# Patient Record
Sex: Female | Born: 1990
Health system: Southern US, Community
[De-identification: ages and names within clinical notes are randomized; demographics above are authoritative.]

## PROBLEM LIST (undated history)

## (undated) DIAGNOSIS — M6289 Other specified disorders of muscle: Secondary | ICD-10-CM

## (undated) DIAGNOSIS — K589 Irritable bowel syndrome without diarrhea: Secondary | ICD-10-CM

## (undated) DIAGNOSIS — Z9889 Other specified postprocedural states: Secondary | ICD-10-CM

## (undated) DIAGNOSIS — N390 Urinary tract infection, site not specified: Secondary | ICD-10-CM

## (undated) DIAGNOSIS — R112 Nausea with vomiting, unspecified: Secondary | ICD-10-CM

## (undated) DIAGNOSIS — H7290 Unspecified perforation of tympanic membrane, unspecified ear: Secondary | ICD-10-CM

## (undated) DIAGNOSIS — K602 Anal fissure, unspecified: Secondary | ICD-10-CM

## (undated) DIAGNOSIS — O139 Gestational [pregnancy-induced] hypertension without significant proteinuria, unspecified trimester: Secondary | ICD-10-CM

## (undated) DIAGNOSIS — R87629 Unspecified abnormal cytological findings in specimens from vagina: Secondary | ICD-10-CM

## (undated) DIAGNOSIS — G43909 Migraine, unspecified, not intractable, without status migrainosus: Secondary | ICD-10-CM

## (undated) DIAGNOSIS — E063 Autoimmune thyroiditis: Secondary | ICD-10-CM

## (undated) HISTORY — DX: Gestational (pregnancy-induced) hypertension without significant proteinuria, unspecified trimester: O13.9

## (undated) HISTORY — DX: Other specified disorders of muscle: M62.89

## (undated) HISTORY — PX: EAR TUBE REMOVAL: SHX1486

## (undated) HISTORY — PX: TYMPANOPLASTY: SHX33

## (undated) HISTORY — DX: Irritable bowel syndrome, unspecified: K58.9

## (undated) HISTORY — DX: Anal fissure, unspecified: K60.2

## (undated) HISTORY — PX: WISDOM TOOTH EXTRACTION: SHX21

## (undated) HISTORY — DX: Migraine, unspecified, not intractable, without status migrainosus: G43.909

## (undated) HISTORY — DX: Unspecified perforation of tympanic membrane, unspecified ear: H72.90

## (undated) HISTORY — PX: TYMPANOSTOMY TUBE PLACEMENT: SHX32

## (undated) HISTORY — PX: TONSILLECTOMY AND ADENOIDECTOMY: SUR1326

---

## 2014-05-16 ENCOUNTER — Other Ambulatory Visit: Payer: Self-pay | Admitting: Obstetrics and Gynecology

## 2014-05-16 DIAGNOSIS — N23 Unspecified renal colic: Secondary | ICD-10-CM

## 2014-05-16 DIAGNOSIS — K8020 Calculus of gallbladder without cholecystitis without obstruction: Secondary | ICD-10-CM

## 2014-05-19 ENCOUNTER — Ambulatory Visit (HOSPITAL_COMMUNITY)
Admission: RE | Admit: 2014-05-19 | Discharge: 2014-05-19 | Disposition: A | Discharge status: Home / Self Care | Payer: BLUE CROSS/BLUE SHIELD | Source: Ambulatory Visit | Attending: Obstetrics and Gynecology | Admitting: Obstetrics and Gynecology

## 2014-05-19 DIAGNOSIS — R1010 Upper abdominal pain, unspecified: Secondary | ICD-10-CM | POA: Diagnosis present

## 2014-05-19 DIAGNOSIS — N23 Unspecified renal colic: Secondary | ICD-10-CM

## 2014-05-19 DIAGNOSIS — K8020 Calculus of gallbladder without cholecystitis without obstruction: Secondary | ICD-10-CM

## 2014-07-11 ENCOUNTER — Ambulatory Visit (INDEPENDENT_AMBULATORY_CARE_PROVIDER_SITE_OTHER): Payer: 59 | Admitting: Neurology

## 2014-07-11 ENCOUNTER — Encounter: Payer: Self-pay | Admitting: Neurology

## 2014-07-11 VITALS — BP 114/80 | HR 84 | Resp 12 | Ht 65.5 in | Wt 119.0 lb

## 2014-07-11 DIAGNOSIS — Z0289 Encounter for other administrative examinations: Secondary | ICD-10-CM

## 2014-07-11 DIAGNOSIS — M791 Myalgia, unspecified site: Secondary | ICD-10-CM | POA: Insufficient documentation

## 2014-07-11 DIAGNOSIS — R350 Frequency of micturition: Secondary | ICD-10-CM | POA: Diagnosis not present

## 2014-07-11 DIAGNOSIS — M542 Cervicalgia: Secondary | ICD-10-CM

## 2014-07-11 DIAGNOSIS — R208 Other disturbances of skin sensation: Secondary | ICD-10-CM | POA: Diagnosis not present

## 2014-07-11 MED ORDER — ETODOLAC ER 600 MG PO TB24: 600.0000 mg | ORAL_TABLET | Freq: Every day | ORAL | Status: DC

## 2014-07-11 NOTE — Progress Notes (Signed)
[facility]  PATIENT: Annette Parks DOB: 12/06/1990  REFERRING DOCTOR OR PCP:  [facility], NP) SOURCE: Patient22  _________________________________   HISTORICAL  CHIEF COMPLAINT:  Chief Complaint  Patient presents with  . Neck Pain    Sts. has had neck, lbp, with left arm, left sided torso numbness/tingling onset Sept. 2015 with no known injury.  Lower back pain radiates slightly into left buttock/fim  . Back Pain  . Numbness    HISTORY OF PRESENT ILLNESS:  She is a 24 yo woman who has had neck pain since September 2015.  Pain started in the shoulder, near her neck and then worked it's way down the back.   She just woke up in more pain the one day, without any obvious trigger.   She has had numbness in the left arm since October 2015 below the elbow.   She gets a burning numbness in the upper back all the way to the buttocks (right more than left).    If she puts anything around her neck, she gets numbness increased.   She saw PT and did therapy 3/week x several weeks and was started on Flexeril.   Flexeril caused her to be sleepy so she stopped.  Therapy helped a little bit.  She tries to do some of the exercises at work and also tries to keep her posture straight.    Heat has helped the back.  Solonpas pads worsened the dysesthesia.        She notes decreased sensation near the neck on the left.     She denies any weakness in the arm.    She has urinary frequency and urgency and had a UA with a small amount of blood.   Cultures were negative but she had a course of Cipro.   She now has 2 x nocturia.   Urgency is mildly better since starting Cipro.  She had an xray showing 'reverse lordosis' .  Heat helps the pain some.  NSAIDs help for a while.    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     REVIEW OF SYSTEMS: Constitutional: No fevers, chills, sweats, or change in appetite Eyes: No visual changes, double vision, eye  pain Ear, nose and throat: No hearing loss, ear pain, nasal congestion, sore throat Cardiovascular: No chest pain, palpitations Respiratory: No shortness of breath at rest or with exertion.   No wheezes GastrointestinaI: No nausea, vomiting, diarrhea, abdominal pain, fecal incontinence Genitourinary: Some frequency.  Occ nocturia. Musculoskeletal: No neck pain, back pain Integumentary: No rash, pruritus, skin lesions Neurological: as above Psychiatric: No depression at this time.  No anxiety Endocrine: No palpitations, diaphoresis, change in appetite, change in weigh or increased thirst Hematologic/Lymphatic: No anemia, purpura, petechiae. Allergic/Immunologic: Some seasonal allergies.  No rashes  ALLERGIES: No Known Allergies  HOME MEDICATIONS:  Current outpatient prescriptions:  .  cetirizine (ZYRTEC) 10 MG tablet, Take 10 mg by mouth daily., Disp: , Rfl:  .  levonorgestrel-ethinyl estradiol (AVIANE,ALESSE,LESSINA) 0.1-20 MG-MCG tablet, Take 1 tablet by mouth daily., Disp: , Rfl:   PAST MEDICAL HISTORY: Past Medical History  Diagnosis Date  . IBS (irritable bowel syndrome)     PAST SURGICAL HISTORY: Past Surgical History  Procedure Laterality Date  . Tympanostomy tube placement    . Tonsillectomy and adenoidectomy    . Wisdom tooth extraction      FAMILY HISTORY: Family History  Problem Relation Age of Onset  .  Hyperlipidemia Father     SOCIAL HISTORY:  History   Social History  . Marital Status: Single    Spouse Name: N/A  . Number of Children: N/A  . Years of Education: N/A   Occupational History  . Registered nurse    Social History Main Topics  . Smoking status: Never Smoker   . Smokeless tobacco: Not on file  . Alcohol Use: 0.0 oz/week    0 Standard drinks or equivalent per week     Comment: occasional  . Drug Use: No  . Sexual Activity: Not on file   Other Topics Concern  . Not on file   Social History Narrative  . No narrative on file       PHYSICAL EXAM  Filed Vitals:   07/11/14 1556  BP: 114/80  Pulse: 84  Resp: 12  Height: 5' 5.5" (1.664 m)  Weight: 119 lb (53.978 kg)    Body mass index is 19.49 kg/(m^2).   General: The patient is well-developed and well-nourished and in no acute distress  Neck: The neck is supple, no carotid bruits are noted.  The neck is tender at the left trapezius, lower cervical paraspinals and rhomboid muscles  Cardiovascular: The heart has a regular rate and rhythm with a normal S1 and S2. There were no murmurs, gallops or rubs. Lungs are clear to auscultation.  Skin: Extremities are without significant edema.  Musculoskeletal:  Back is mildly tender over lower lumbar paraspinals and piriformis muscles  Neurologic Exam  Mental status: The patient is alert and oriented x 3 at the time of the examination. The patient has apparent normal recent and remote memory, with an apparently normal attention span and concentration ability.   Speech is normal.  Cranial nerves: Extraocular movements are full..  Facial symmetry is present. There is good facial sensation to soft touch bilaterally.Facial strength is normal.  Trapezius and sternocleidomastoid strength is normal. No dysarthria is noted.  The tongue is midline, and the patient has symmetric elevation of the soft palate. No obvious hearing deficits are noted.  Motor:  Muscle bulk is normal.   Tone is normal. Strength is  5 / 5 in all 4 extremities.   Sensory: Sensory testing is intact to soft touch and vibration sensation in all 4 extremities.  Coordination: Cerebellar testing reveals good finger-nose-finger and heel-to-shin bilaterally.  Gait and station: Station is normal.   Gait is normal. Tandem gait is normal.   Reflexes: Deep tendon reflexes are symmetric and normal bilaterally.      DIAGNOSTIC DATA (LABS, IMAGING, TESTING) - I reviewed patient records, labs, notes, testing and imaging myself where  available.    ASSESSMENT AND PLAN  Neck pain - Plan: MR Cervical Spine Wo Contrast, Sedimentation Rate, ANA  Myalgia - Plan: Sedimentation Rate, ANA  Dysesthesia - Plan: MR Cervical Spine Wo Contrast, Sedimentation Rate, ANA  Urinary frequency    In summary, Annette Parks is a 24 year old woman with a six-month history of neck pain, left greater than right sided spasms, numbness/dysesthesias and bladder changes. The etiology is not completely clear but with the numbness we need to obtain an MRI of the cervical spine to make sure that there is not a disc or spinal cord process that would require treatment. I will have her take an NSAID and we discussed obtaining an over-the-door traction device used for 15 minutes with 10 pounds in the afternoon or evening.  I'll also check some vasculitis labs to make sure that she does  not have an autoimmune etiology. If she is no better off in a week we can do the trigger point injections into the trapezius, rhomboid another tender muscles.  She will return to see me as needed and I will let her know the results of the MRI and tests  Richard A. Felecia Shelling, MD, PhD 10/27/4101, 0:13 PM Certified in Neurology, Clinical Neurophysiology, Sleep Medicine, Pain Medicine and Neuroimaging  [facility] [address] [place], [place] [address] [phone]

## 2014-07-12 LAB — ANA: ANA: POSITIVE — AB

## 2014-07-12 LAB — SEDIMENTATION RATE: SED RATE: 2 mm/h (ref 0–32)

## 2014-07-14 ENCOUNTER — Ambulatory Visit (INDEPENDENT_AMBULATORY_CARE_PROVIDER_SITE_OTHER): Payer: 59

## 2014-07-14 DIAGNOSIS — M542 Cervicalgia: Secondary | ICD-10-CM

## 2014-07-14 DIAGNOSIS — R208 Other disturbances of skin sensation: Secondary | ICD-10-CM | POA: Diagnosis not present

## 2014-07-14 LAB — SPECIMEN STATUS REPORT

## 2014-07-18 LAB — SPECIMEN STATUS REPORT

## 2014-07-18 LAB — ENA+DNA/DS+SJORGEN'S
ENA RNP AB: 1.4 AI — AB (ref 0.0–0.9)
ENA SM Ab Ser-aCnc: <0.2 AI (ref 0.0–0.9)
ENA SSA (RO) Ab: <0.2 AI (ref 0.0–0.9)
ENA SSB (LA) Ab: <0.2 AI (ref 0.0–0.9)

## 2014-07-18 LAB — ANA W/REFLEX: ANA: POSITIVE — AB

## 2015-02-22 ENCOUNTER — Other Ambulatory Visit: Payer: Self-pay | Admitting: *Deleted

## 2015-02-22 DIAGNOSIS — M79642 Pain in left hand: Secondary | ICD-10-CM

## 2015-02-22 DIAGNOSIS — M791 Myalgia, unspecified site: Secondary | ICD-10-CM

## 2015-02-22 DIAGNOSIS — M25541 Pain in joints of right hand: Secondary | ICD-10-CM

## 2015-02-23 ENCOUNTER — Other Ambulatory Visit (INDEPENDENT_AMBULATORY_CARE_PROVIDER_SITE_OTHER): Payer: Self-pay

## 2015-02-23 DIAGNOSIS — M79642 Pain in left hand: Secondary | ICD-10-CM

## 2015-02-23 DIAGNOSIS — M25541 Pain in joints of right hand: Secondary | ICD-10-CM

## 2015-02-23 DIAGNOSIS — Z0289 Encounter for other administrative examinations: Secondary | ICD-10-CM

## 2015-02-23 DIAGNOSIS — M791 Myalgia, unspecified site: Secondary | ICD-10-CM

## 2015-02-24 LAB — RHEUMATOID FACTOR: Rhuematoid fact SerPl-aCnc: <10 IU/mL (ref 0.0–13.9)

## 2015-02-24 LAB — ANA: Anti Nuclear Antibody(ANA): POSITIVE — AB

## 2015-02-24 LAB — SEDIMENTATION RATE: Sed Rate: 2 mm/hr (ref 0–32)

## 2015-02-27 ENCOUNTER — Telehealth: Payer: Self-pay | Admitting: *Deleted

## 2015-02-27 NOTE — Telephone Encounter (Signed)
I have spoken with Shamikia and per RAS, advised ANA is still positive.  I have spoken with Malachy Mood in the pharmacy and she will add reflex/fim

## 2015-02-27 NOTE — Telephone Encounter (Signed)
-----   Message from Britt Bottom, MD sent at 02/27/2015 12:57 PM EDT ----- I let him a note that the ANA was still positive and the other tests were fine. Could you see if we could add the reflex to the ANA

## 2015-02-28 LAB — SPECIMEN STATUS REPORT

## 2015-03-01 LAB — ENA+DNA/DS+SJORGEN'S
ENA RNP Ab: 1.9 AI — ABNORMAL HIGH (ref 0.0–0.9)
ENA SSA (RO) Ab: <0.2 AI (ref 0.0–0.9)
ENA SSB (LA) Ab: <0.2 AI (ref 0.0–0.9)

## 2015-03-01 LAB — SPECIMEN STATUS REPORT

## 2015-03-01 LAB — ANA W/REFLEX: Anti Nuclear Antibody(ANA): POSITIVE — AB

## 2015-03-22 ENCOUNTER — Ambulatory Visit (INDEPENDENT_AMBULATORY_CARE_PROVIDER_SITE_OTHER): Payer: 59 | Admitting: Family Medicine

## 2015-03-22 ENCOUNTER — Encounter: Payer: Self-pay | Admitting: Family Medicine

## 2015-03-22 VITALS — BP 120/77 | HR 93 | Temp 98.1°F | Resp 16 | Ht 65.0 in | Wt 126.0 lb

## 2015-03-22 DIAGNOSIS — F411 Generalized anxiety disorder: Secondary | ICD-10-CM

## 2015-03-22 DIAGNOSIS — R61 Generalized hyperhidrosis: Secondary | ICD-10-CM

## 2015-03-22 DIAGNOSIS — R002 Palpitations: Secondary | ICD-10-CM | POA: Diagnosis not present

## 2015-03-22 LAB — TSH: TSH: 1.529 u[IU]/mL (ref 0.350–4.500)

## 2015-03-22 MED ORDER — FLUOXETINE HCL 20 MG PO TABS: 20.0000 mg | ORAL_TABLET | Freq: Every day | ORAL | Status: DC

## 2015-03-22 NOTE — Progress Notes (Signed)
[facility] [address] [place] [address] [phone]  Date:  03/22/2015   Name:  Annette Parks   DOB:  Jul 13, 1990   MRN:  [id number]  PCP:  No primary care provider on file.    Chief Complaint: Annual Exam   History of Present Illness:  Annette Parks is a 24 y.o. very pleasant female patient who presents with the following:  Here today as a new pt to establish care. 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   She has indicated multiple concerns on her ROS sheet which we discussed as below She has had a lot of ENT issues over the years- she has an ENT doctor She does have an OBG and her pap is UTD, she uses OCP. Creta Levin is her OBG provider  She does also suffer from IBS- she uses a probiotic that helps her some.  Dr. Collene Mares saw her a few times.   She is also using activia yogurt.  She has some bleeding due to an anal fissure.    She does have some anxiety- she has noted this for years.  Her parents tried to work with her about her anxiety as a teen but she "did not want to listen to them."  However she now realized that they were right.   She does have some OCD tendencies.  She does not have any compulsive behaviors.  She can get overwhelmed easily.   Excessive sweating can be an issue.   She notes anxiety maybe "90%" of the time.   She did have some sx of depression last year- her family was all living in [place], and she was on the night shift.  She moved to [facility] and these sx have improved.  No SI  She did have a heart murmur as a teen- this was evaluated by cardiology. She does notice some heart palpitations at times- these seem more related to being more active.  She has checked her pulse and thinks that she is having PVCs on occasion.    LMP 11/8 Patient Active Problem List   Diagnosis Date Noted  . Neck pain 07/11/2014  . Myalgia 07/11/2014  . Dysesthesia 07/11/2014  . Urinary frequency 07/11/2014    Past Medical History   Diagnosis Date  . IBS (irritable bowel syndrome)     Past Surgical History  Procedure Laterality Date  . Tympanostomy tube placement    . Tonsillectomy and adenoidectomy    . Wisdom tooth extraction      Social History  Substance Use Topics  . Smoking status: Never Smoker   . Smokeless tobacco: None  . Alcohol Use: 0.0 oz/week    0 Standard drinks or equivalent per week     Comment: occasional    Family History  Problem Relation Age of Onset  . Hyperlipidemia Father     Not on File  Medication list has been reviewed and updated.  Current Outpatient Prescriptions on File Prior to Visit  Medication Sig Dispense Refill  . cetirizine (ZYRTEC) 10 MG tablet Take 10 mg by mouth daily.    Marland Kitchen etodolac (LODINE XL) 600 MG 24 hr tablet Take 1 tablet (600 mg total) by mouth daily. 30 tablet 3  . levonorgestrel-ethinyl estradiol (AVIANE,ALESSE,LESSINA) 0.1-20 MG-MCG tablet Take 1 tablet by mouth daily.     No current facility-administered medications on file prior to visit.    Review of Systems:  As per HPI-  otherwise negative.   Physical Examination: Filed Vitals:   03/22/15 1020  BP: 120/77  Pulse: 93  Temp: 98.1 F (36.7 C)  Resp: 16   Filed Vitals:   03/22/15 1020  Height: 5\' 5"  (1.651 m)  Weight: 126 lb (57.153 kg)   Body mass index is 20.97 kg/(m^2). Ideal Body Weight: Weight in (lb) to have BMI = 25: 149.9  GEN: WDWN, NAD, Non-toxic, A & O x 3, slim, looks very healthy  HEENT: Atraumatic, Normocephalic. Neck supple. No masses, No LAD.  Bilateral TM wnl, oropharynx normal.  PEERL,EOMI.   Ears and Nose: No external deformity. CV: RRR, No M/G/R. No JVD. No thrill. No extra heart sounds. PULM: CTA B, no wheezes, crackles, rhonchi. No retractions. No resp. distress. No accessory muscle use. ABD: S, NT, ND, +BS. No rebound. No HSM. EXTR: No c/c/e NEURO Normal gait.  PSYCH: Normally interactive. Conversant. Not depressed or anxious appearing.  Calm demeanor.    EKG:  SR, no concerning findings Assessment and Plan: GAD (generalized anxiety disorder) - Plan: FLUoxetine (PROZAC) 20 MG tablet  Palpitations - Plan: EKG 12-Lead, Thyroid peroxidase antibody  Excessive sweating - Plan: TSH, Thyroid peroxidase antibody  Here today with a few concerns. Will start prozac for anxiety. She is concerned about weight gain as she has gone up a few lbs recently.  However reassured the prozac is generally weight neutral, and in any case her weight is still on the low side of normal.  She will let me know how this does for her She would like to be screened for hashimotos thyroiditis as this was an issue for her mother.  Will check labs as above for her No evidence of a dangerous cause of her palpitations.  Offered to refer to cardiology but she declines for now Will plan further follow- up pending labs.   Signed Lamar Blinks, MD

## 2015-03-22 NOTE — Patient Instructions (Addendum)
It was nice to see you today!  I will be in touch with your labs- we will look for any thyroid issue.    Let's have you start on 20mg  of prozac once a day.  After 2-3 weeks you can increase to 40 mg as long as you are not too bothered by any side effects.  Please send me a mychart in about one month with an update.    Your EKG looks fine.  Let me know if you have any change or worsening of your palpitations

## 2015-03-24 LAB — THYROID PEROXIDASE ANTIBODY: Thyroperoxidase Ab SerPl-aCnc: 2 IU/mL (ref <9)

## 2015-04-09 ENCOUNTER — Telehealth: Payer: Self-pay | Admitting: Family Medicine

## 2015-04-09 ENCOUNTER — Encounter: Payer: Self-pay | Admitting: Family Medicine

## 2015-04-09 NOTE — Telephone Encounter (Signed)
Pt called the answering service at 8:30 pm with complaint of a "headache like someone pressing behind my eyes" for about 10 days.  She is not sure of the cause.  She has been using OTC medications.  Advised her that we are glad to see her tomorrow- she is concerned about her work scheduled and I advised her of our extended hours.  Also advised her that if she does not feel that she is ok to be seen tomorrow please go to the ER tonight.  She stated understanding.  Suggested that she not take any NSAIDs prior to being seen tomorrow so we might give her a shot of toradol

## 2015-04-10 ENCOUNTER — Ambulatory Visit (INDEPENDENT_AMBULATORY_CARE_PROVIDER_SITE_OTHER): Payer: 59 | Admitting: Family Medicine

## 2015-04-10 VITALS — BP 110/64 | HR 99 | Temp 98.1°F | Resp 18 | Ht 66.0 in | Wt 122.0 lb

## 2015-04-10 DIAGNOSIS — G44221 Chronic tension-type headache, intractable: Secondary | ICD-10-CM

## 2015-04-10 MED ORDER — KETOROLAC TROMETHAMINE 30 MG/ML IJ SOLN
30.0000 mg | Freq: Once | INTRAMUSCULAR | Status: AC
  Administered 2015-04-10: 30 mg via INTRAMUSCULAR

## 2015-04-10 MED ORDER — PREDNISONE 20 MG PO TABS: ORAL_TABLET | ORAL | Status: DC

## 2015-04-10 NOTE — Progress Notes (Addendum)
[facility] [address] [place] [address] [phone]  Date:  04/10/2015   Name:  Annette Parks   DOB:  1991-01-31   MRN:  [id number]  PCP:  No primary care provider on file.    Chief Complaint: Eye Pain   History of Present Illness:  Annette Parks is a 24 y.o. very pleasant female patient who presents with the following:  See phone note from yesterday- I saw this pt in clinic about 2 weeks ago and we started prozac for anxiety.  She then noted onset of a persistent HA.  She feels like her HA is "worse today."  She has not had a HA quite like this in the past.   " I was pretty much born with chronic migraines but I grew out of that."   She notes a pain and pressure behind her eyes and in her nose but does not have any nasal congestion No fever.  She is not sure what she used for her migraines in the past- no recent migraine.    No phono or photo sensitivity.  Cold air can increase her sx No vomiting- she has noted some burping and tried pepto for this. It did help.   She is eating normally She does have more pain if she bends down or lies down.    Over the last 24 hours she has taken only her OCP.    Recent thyroid labs looked fine  Results for orders placed or performed in visit on 03/22/15  TSH  Result Value Ref Range   TSH 1.529 0.350 - 4.500 uIU/mL  Thyroid peroxidase antibody  Result Value Ref Range   Thyroperoxidase Ab SerPl-aCnc 2 <9 IU/mL     Patient Active Problem List   Diagnosis Date Noted  . GAD (generalized anxiety disorder) 03/22/2015  . Neck pain 07/11/2014  . Myalgia 07/11/2014  . Dysesthesia 07/11/2014  . Urinary frequency 07/11/2014    Past Medical History  Diagnosis Date  . IBS (irritable bowel syndrome)     Past Surgical History  Procedure Laterality Date  . Tympanostomy tube placement    . Tonsillectomy and adenoidectomy    . Wisdom tooth extraction      Social History  Substance Use Topics  . Smoking status:  Never Smoker   . Smokeless tobacco: None  . Alcohol Use: 0.0 oz/week    0 Standard drinks or equivalent per week     Comment: occasional    Family History  Problem Relation Age of Onset  . Hyperlipidemia Father     No Known Allergies  Medication list has been reviewed and updated.  Current Outpatient Prescriptions on File Prior to Visit  Medication Sig Dispense Refill  . cetirizine (ZYRTEC) 10 MG tablet Take 10 mg by mouth daily.    Marland Kitchen FLUoxetine (PROZAC) 20 MG tablet Take 1 tablet (20 mg total) by mouth daily. May increase to 40 mg after 2-3 weeks 60 tablet 4  . levonorgestrel-ethinyl estradiol (AVIANE,ALESSE,LESSINA) 0.1-20 MG-MCG tablet Take 1 tablet by mouth daily.    Marland Kitchen etodolac (LODINE XL) 600 MG 24 hr tablet Take 1 tablet (600 mg total) by mouth daily. (Patient not taking: Reported on 04/10/2015) 30 tablet 3   No current facility-administered medications on file prior to visit.    Review of Systems:  As per HPI- otherwise negative.   Physical Examination: Filed Vitals:   04/10/15 1206  BP: 110/64  Pulse: 99  Temp: 98.1 F (36.7 C)  Resp: 18   Filed Vitals:   04/10/15 1206  Height: 5\' 6"  (1.676 m)  Weight: 122 lb (55.339 kg)   Body mass index is 19.7 kg/(m^2). Ideal Body Weight: Weight in (lb) to have BMI = 25: 154.6  GEN: WDWN, NAD, Non-toxic, A & O x 3, looks well, neck is supple HEENT: Atraumatic, Normocephalic. Neck supple. No masses, No LAD.  Bilateral TM wnl, oropharynx normal.  PEERL,EOMI.   Limited fundoscopic exam normal.  Bilateral globes are non- tender to gentle pressure with lids closed.  Nasal cavity is congested but she denies any tenderness with percussion over the sinuses  Ears and Nose: No external deformity. CV: RRR, No M/G/R. No JVD. No thrill. No extra heart sounds. PULM: CTA B, no wheezes, crackles, rhonchi. No retractions. No resp. distress. No accessory muscle use. EXTR: No c/c/e NEURO Normal gait.  PSYCH: Normally interactive.  Conversant. Not depressed or anxious appearing.  Calm demeanor.  Full neuro exam is wnl- normal strength, DTR, sensation and balance testing.  Normal facial motion and strength of all limbs.  Negative romberg, normal tandem gait testing  Assessment and Plan: Chronic tension-type headache, intractable - Plan: ketorolac (TORADOL) 30 MG/ML injection 30 mg, predniSONE (DELTASONE) 20 MG tablet  Discussed with Terrence Dupont in detail.  Discussed imaging vs treatment first.  Since her sx have been present for over a week do not feel compelled to do a CT- especially given radiation concerns. She agrees and would prefer to try treatment first.  This may be a tension HA vs sinus pressure.  Will treat her with a shot of toradol today and also gave her an rx for prednisone to fill tomorrow if needed.  If her sx are worse or if not better soon she will call me and we can consider further treatment   Signed Lamar Blinks, MD  Called to check on her 12/13[facility] asking her to send me a message with an update.

## 2015-04-10 NOTE — Patient Instructions (Signed)
We gave you a shot of toradol for your headache today.  No additional NSAIDs such as ibuprofen or aleve today If your headache is still lingering tomorrow start the prednisone rx.  (if you do take the prednisone continue to avoid NSAIDs while you are on it).     If your headache is still not better please let me know and we will have your eyes checked and consider a CT of your head

## 2015-04-12 ENCOUNTER — Encounter: Payer: Self-pay | Admitting: Family Medicine

## 2015-05-01 MED FILL — LEVONOR-ETH ESTRAD 0.1-0.02: 0.1-20 | 84 days supply | Qty: 84 | Fill #3

## 2015-07-11 DIAGNOSIS — Z01419 Encounter for gynecological examination (general) (routine) without abnormal findings: Secondary | ICD-10-CM | POA: Diagnosis not present

## 2015-07-11 DIAGNOSIS — Z6820 Body mass index (BMI) 20.0-20.9, adult: Secondary | ICD-10-CM | POA: Diagnosis not present

## 2015-07-11 MED FILL — LEVONOR-ETH ESTRAD 0.1-0.02: 0.1-20 | 84 days supply | Qty: 84 | Fill #0

## 2015-08-17 ENCOUNTER — Encounter: Payer: Self-pay | Admitting: Family Medicine

## 2015-10-17 MED FILL — LEVONOR-ETH ESTRAD 0.1-0.02: 0.1-20 | 84 days supply | Qty: 84 | Fill #1

## 2016-01-03 MED FILL — LEVONOR-ETH ESTRAD 0.1-0.02: 0.1-20 | 84 days supply | Qty: 84 | Fill #2

## 2016-01-23 ENCOUNTER — Other Ambulatory Visit: Payer: Self-pay | Admitting: Neurology

## 2016-01-23 DIAGNOSIS — M542 Cervicalgia: Secondary | ICD-10-CM

## 2016-02-02 DIAGNOSIS — M542 Cervicalgia: Secondary | ICD-10-CM | POA: Diagnosis not present

## 2016-02-02 MED FILL — predniSONE 5 MG (21) TBPK: 5 | 6 days supply | Qty: 21 | Fill #0

## 2016-02-16 DIAGNOSIS — G43909 Migraine, unspecified, not intractable, without status migrainosus: Secondary | ICD-10-CM | POA: Diagnosis not present

## 2016-02-16 DIAGNOSIS — Z1329 Encounter for screening for other suspected endocrine disorder: Secondary | ICD-10-CM | POA: Diagnosis not present

## 2016-02-16 DIAGNOSIS — H40053 Ocular hypertension, bilateral: Secondary | ICD-10-CM | POA: Diagnosis not present

## 2016-02-16 DIAGNOSIS — Z79899 Other long term (current) drug therapy: Secondary | ICD-10-CM | POA: Diagnosis not present

## 2016-02-16 DIAGNOSIS — J3489 Other specified disorders of nose and nasal sinuses: Secondary | ICD-10-CM | POA: Diagnosis not present

## 2016-02-16 LAB — HEPATIC FUNCTION PANEL
ALT: 20 U/L (ref 7–35)
AST: 17 U/L (ref 13–35)
Alkaline Phosphatase: 46 U/L (ref 25–125)
Bilirubin, Total: 0.8 mg/dL

## 2016-02-16 LAB — BASIC METABOLIC PANEL
BUN: 10 mg/dL (ref 4–21)
CREATININE: 0.9 mg/dL (ref 0.5–1.1)
Glucose: 108 mg/dL
POTASSIUM: 4.1 mmol/L (ref 3.4–5.3)
Sodium: 140 mmol/L (ref 137–147)

## 2016-02-16 LAB — CBC AND DIFFERENTIAL
HCT: 46 % (ref 36–46)
HEMOGLOBIN: 14.5 g/dL (ref 12.0–16.0)
Platelets: 276 10*3/uL (ref 150–399)
WBC: 7.3 10^3/mL

## 2016-02-16 LAB — TSH: TSH: 1.3 u[IU]/mL (ref 0.41–5.90)

## 2016-02-23 DIAGNOSIS — G43909 Migraine, unspecified, not intractable, without status migrainosus: Secondary | ICD-10-CM | POA: Diagnosis not present

## 2016-02-28 MED FILL — FLUCONAZOLE 150 MG TABLET: 150 | 1 days supply | Qty: 1 | Fill #0

## 2016-02-29 MED FILL — SUMATRIPTAN SUCC 100 MG TAB: 100 | 30 days supply | Qty: 9 | Fill #0

## 2016-03-01 DIAGNOSIS — G43009 Migraine without aura, not intractable, without status migrainosus: Secondary | ICD-10-CM | POA: Diagnosis not present

## 2016-03-02 DIAGNOSIS — N760 Acute vaginitis: Secondary | ICD-10-CM | POA: Diagnosis not present

## 2016-03-04 DIAGNOSIS — B373 Candidiasis of vulva and vagina: Secondary | ICD-10-CM | POA: Diagnosis not present

## 2016-03-04 MED FILL — FLUCONAZOLE 150 MG TABLET: 150 | 6 days supply | Qty: 3 | Fill #0

## 2016-04-02 MED FILL — LEVONOR-ETH ESTRAD 0.1-0.02: 0.1-20 | 84 days supply | Qty: 84 | Fill #3

## 2016-05-07 ENCOUNTER — Ambulatory Visit (INDEPENDENT_AMBULATORY_CARE_PROVIDER_SITE_OTHER): Payer: 59 | Admitting: Neurology

## 2016-05-07 ENCOUNTER — Encounter: Payer: Self-pay | Admitting: Neurology

## 2016-05-07 VITALS — BP 110/88 | Resp 12 | Ht 65.0 in | Wt 128.5 lb

## 2016-05-07 DIAGNOSIS — H5713 Ocular pain, bilateral: Secondary | ICD-10-CM | POA: Diagnosis not present

## 2016-05-07 DIAGNOSIS — M542 Cervicalgia: Secondary | ICD-10-CM

## 2016-05-07 DIAGNOSIS — R519 Headache, unspecified: Secondary | ICD-10-CM

## 2016-05-07 DIAGNOSIS — R51 Headache: Secondary | ICD-10-CM | POA: Diagnosis not present

## 2016-05-07 DIAGNOSIS — G43009 Migraine without aura, not intractable, without status migrainosus: Secondary | ICD-10-CM | POA: Diagnosis not present

## 2016-05-07 MED ORDER — METOPROLOL SUCCINATE ER 25 MG PO TB24: 25.0000 mg | ORAL_TABLET | Freq: Every day | ORAL | 11 refills | Status: DC

## 2016-05-07 MED ORDER — ELETRIPTAN HYDROBROMIDE 40 MG PO TABS: 40.0000 mg | ORAL_TABLET | ORAL | 5 refills | Status: DC | PRN

## 2016-05-07 MED FILL — METOPROLOL SUCC ER 25 MG TA: 25 | 30 days supply | Qty: 30 | Fill #0

## 2016-05-07 NOTE — Progress Notes (Signed)
[facility]  PATIENT: Annette Parks DOB: 1991-02-22  REFERRING DOCTOR OR PCP:  [facility], NP) SOURCE: Patient22  _________________________________   HISTORICAL  CHIEF COMPLAINT:  Chief Complaint  Patient presents with  . Migraines    HISTORY OF PRESENT ILLNESS:  Annette Parks is a 26 yo woman with several severe headaches over the last few months   She had many migraines when she was young (before age 20) and was on a preventative for several years.    At that time, she would get one every week.    These were severe,   She does not recall the names of the prophylactic or preventative.  Migraines improved as a teenager and she was able to go off med's.  Over the summer, she had a severe migraine shortly after she started paroxetine.   In October, she had a migraine and went to Urgent Care.  Toradol did not help.  A sinus x-ray was reportedly clear.    Last week, she had a severe migraine lasting 8 hours with nausea, photophobia and phonophobia.    Sumatriptan did not help.     She still feels sore around the eyes since then.  The pain has not improved with supraorbital nerve blocks, sumatriptan injection, anti-inflammatories including Toradol injection.  One of her headaches occurred around her period.    Before the headaches, she had a puffy sensation in her eyes but no visual aura.   She still has intermittent neck pain.   She was told by orthopedics that her posture is exacerbating her pain and she is trying to do better.    This has helped some. Massage also helps.  Pain worsens when she sits for long period of time. She denies any pain that radiates down into the arms.  She had an xray showing 'reverse lordosis' .  MRI of Cervical spine showed no significant degenerative changes and no nerve root compression. Straightening of the cervical curvature was noted   REVIEW OF SYSTEMS: Constitutional: No fevers, chills, sweats, or change in appetite Eyes:  No visual changes, double vision, eye pain Ear, nose and throat: No hearing loss, ear pain, nasal congestion, sore throat Cardiovascular: No chest pain, palpitations Respiratory: No shortness of breath at rest or with exertion.   No wheezes GastrointestinaI: No nausea, vomiting, diarrhea, abdominal pain, fecal incontinence Genitourinary: Some frequency.  Occ nocturia. Musculoskeletal: No neck pain, back pain Integumentary: No rash, pruritus, skin lesions Neurological: as above Psychiatric: No depression at this time.  No anxiety Endocrine: No palpitations, diaphoresis, change in appetite, change in weigh or increased thirst Hematologic/Lymphatic: No anemia, purpura, petechiae. Allergic/Immunologic: Some seasonal allergies.  No rashes  ALLERGIES: No Known Allergies  HOME MEDICATIONS:  Current Outpatient Prescriptions:  .  aspirin-acetaminophen-caffeine (EXCEDRIN MIGRAINE) 250-250-65 MG tablet, Take by mouth every 6 (six) hours as needed for headache., Disp: , Rfl:  .  levonorgestrel-ethinyl estradiol (AVIANE,ALESSE,LESSINA) 0.1-20 MG-MCG tablet, Take 1 tablet by mouth daily., Disp: , Rfl:  .  loratadine (CLARITIN) 10 MG tablet, Take 10 mg by mouth daily., Disp: , Rfl:  .  eletriptan (RELPAX) 40 MG tablet, Take 1 tablet (40 mg total) by mouth as needed for migraine or headache. May repeat in 2 hours if headache persists or recurs., Disp: 10 tablet, Rfl: 5 .  metoprolol succinate (TOPROL XL) 25 MG 24 hr tablet, Take 1 tablet (25 mg total) by mouth daily., Disp: 30 tablet, Rfl: 11  PAST MEDICAL HISTORY: Past Medical History:  Diagnosis Date  . IBS (irritable bowel syndrome)     PAST SURGICAL HISTORY: Past Surgical History:  Procedure Laterality Date  . TONSILLECTOMY AND ADENOIDECTOMY    . TYMPANOSTOMY TUBE PLACEMENT    . WISDOM TOOTH EXTRACTION      FAMILY HISTORY: Family History  Problem Relation Age of Onset  . Hyperlipidemia Father     SOCIAL HISTORY:  Social  History   Social History  . Marital status: Single    Spouse name: N/A  . Number of children: N/A  . Years of education: N/A   Occupational History  . Registered nurse    Social History Main Topics  . Smoking status: Never Smoker  . Smokeless tobacco: Not on file  . Alcohol use 0.0 oz/week     Comment: occasional  . Drug use: No  . Sexual activity: Not on file   Other Topics Concern  . Not on file   Social History Narrative  . No narrative on file     PHYSICAL EXAM  Vitals:   05/07/16 0921  BP: 110/88  Resp: 12  Weight: 128 lb 8 oz (58.3 kg)  Height: 5\' 5"  (1.651 m)    Body mass index is 21.38 kg/m.   General: The patient is well-developed and well-nourished and in no acute distress  Neck: The neck is supple, no carotid bruits are noted.  The neck is tender at the splenius capitis muscles bilaterally   Neurologic Exam  Mental status: The patient is alert and oriented x 3 at the time of the examination. The patient has apparent normal recent and remote memory, with an apparently normal attention span and concentration ability.   Speech is normal.  Cranial nerves: Extraocular movements are full.  There is good facial sensation to soft touch bilaterally.Facial strength is normal.  Trapezius and sternocleidomastoid strength is normal. No dysarthria is noted.  The tongue is midline, and the patient has symmetric elevation of the soft palate. No obvious hearing deficits are noted.  Motor:  Muscle bulk is normal.   Tone is normal. Strength is  5 / 5 in all 4 extremities.   Sensory: Sensory testing is intact to soft touch in all 4 extremities.  Coordination: Cerebellar testing reveals good finger-nose-finger and heel-to-shin bilaterally.  Gait and station: Station is normal.  Gait is normal. Tandem gait is normal.   Reflexes: Deep tendon reflexes are symmetric and normal bilaterally.      DIAGNOSTIC DATA (LABS, IMAGING, TESTING) - I reviewed patient records,  labs, notes, testing and imaging myself where available.    ASSESSMENT AND PLAN  Neck pain  Migraine without aura and without status migrainosus, not intractable - Plan: MR BRAIN W WO CONTRAST  Eye pain, bilateral - Plan: MR BRAIN W WO CONTRAST  Facial pain - Plan: MR BRAIN W WO CONTRAST   1.   Trigger point injection of both splenius capitis muscles with 80 mg Depo-Medrol in 3 mL Marcaine.. She did note some improvement of pain but she still had pain in and around her eyes.  2.    Metoprolol XL 25 mg for migraine prophylaxis. If not tolerated or ineffective, consider switching to one of the antiepileptic agents. 3.    Relpax when necessary migraine. She received no benefit from Imitrex pills and Imitrex injection. 4.    MRI of the brain with and without contrast due to the new onset aches and eye pain that has not resolved with multiple med's and injections in order to  rule out mass lesion or inflammatory process. She will return to see me in a couple months ot as needed and I will let her know the results of the MRI  Janitza Revuelta A. Felecia Shelling, MD, PhD [id number], [id number] AM Certified in Neurology, Clinical Neurophysiology, Sleep Medicine, Pain Medicine and Neuroimaging  [facility] [address], [place], [place] [address] [phone]

## 2016-05-08 ENCOUNTER — Ambulatory Visit (INDEPENDENT_AMBULATORY_CARE_PROVIDER_SITE_OTHER): Payer: 59

## 2016-05-08 DIAGNOSIS — H5713 Ocular pain, bilateral: Secondary | ICD-10-CM

## 2016-05-08 DIAGNOSIS — R519 Headache, unspecified: Secondary | ICD-10-CM

## 2016-05-08 DIAGNOSIS — R51 Headache: Secondary | ICD-10-CM

## 2016-05-08 DIAGNOSIS — G43009 Migraine without aura, not intractable, without status migrainosus: Secondary | ICD-10-CM | POA: Diagnosis not present

## 2016-05-08 MED ORDER — GADOPENTETATE DIMEGLUMINE 469.01 MG/ML IV SOLN: 15.0000 mL | Freq: Once | INTRAVENOUS | Status: DC | PRN

## 2016-05-15 ENCOUNTER — Other Ambulatory Visit: Payer: 59

## 2016-05-27 ENCOUNTER — Ambulatory Visit: Payer: Self-pay | Admitting: Neurology

## 2016-06-11 MED FILL — METOPROLOL SUCC ER 25 MG TA: 25 | 30 days supply | Qty: 30 | Fill #1

## 2016-06-18 MED FILL — LEVONOR-ETH ESTRAD 0.1-0.02: 0.1-20 | 84 days supply | Qty: 84 | Fill #0

## 2016-07-12 DIAGNOSIS — Z6821 Body mass index (BMI) 21.0-21.9, adult: Secondary | ICD-10-CM | POA: Diagnosis not present

## 2016-07-12 DIAGNOSIS — N760 Acute vaginitis: Secondary | ICD-10-CM | POA: Diagnosis not present

## 2016-07-12 DIAGNOSIS — Z01419 Encounter for gynecological examination (general) (routine) without abnormal findings: Secondary | ICD-10-CM | POA: Diagnosis not present

## 2016-07-12 DIAGNOSIS — Z113 Encounter for screening for infections with a predominantly sexual mode of transmission: Secondary | ICD-10-CM | POA: Diagnosis not present

## 2016-07-18 MED FILL — METOPROLOL SUCC ER 25 MG TA: 25 | 30 days supply | Qty: 30 | Fill #2

## 2016-08-02 ENCOUNTER — Telehealth: Payer: Self-pay

## 2016-08-05 ENCOUNTER — Encounter: Payer: Self-pay | Admitting: Family Medicine

## 2016-08-05 ENCOUNTER — Ambulatory Visit (INDEPENDENT_AMBULATORY_CARE_PROVIDER_SITE_OTHER): Payer: 59 | Admitting: Family Medicine

## 2016-08-05 ENCOUNTER — Encounter: Payer: Self-pay | Admitting: Internal Medicine

## 2016-08-05 VITALS — BP 112/72 | HR 99 | Temp 98.3°F | Ht 65.0 in | Wt 129.8 lb

## 2016-08-05 DIAGNOSIS — R198 Other specified symptoms and signs involving the digestive system and abdomen: Secondary | ICD-10-CM

## 2016-08-05 DIAGNOSIS — Z1329 Encounter for screening for other suspected endocrine disorder: Secondary | ICD-10-CM

## 2016-08-05 DIAGNOSIS — Z131 Encounter for screening for diabetes mellitus: Secondary | ICD-10-CM

## 2016-08-05 DIAGNOSIS — Z8349 Family history of other endocrine, nutritional and metabolic diseases: Secondary | ICD-10-CM

## 2016-08-05 DIAGNOSIS — Z83438 Family history of other disorder of lipoprotein metabolism and other lipidemia: Secondary | ICD-10-CM

## 2016-08-05 DIAGNOSIS — Z7189 Other specified counseling: Secondary | ICD-10-CM

## 2016-08-05 DIAGNOSIS — Z130 Encounter for screening for diseases of the blood and blood-forming organs and certain disorders involving the immune mechanism: Secondary | ICD-10-CM

## 2016-08-05 DIAGNOSIS — Z0000 Encounter for general adult medical examination without abnormal findings: Secondary | ICD-10-CM | POA: Diagnosis not present

## 2016-08-05 DIAGNOSIS — Z7184 Encounter for health counseling related to travel: Secondary | ICD-10-CM

## 2016-08-05 LAB — LIPID PANEL
CHOL/HDL RATIO: 2
Cholesterol: 143 mg/dL (ref 0–200)
HDL: 58.2 mg/dL (ref >39.00)
LDL CALC: 61 mg/dL (ref 0–99)
NonHDL: 85.04
TRIGLYCERIDES: 121 mg/dL (ref 0.0–149.0)
VLDL: 24.2 mg/dL (ref 0.0–40.0)

## 2016-08-05 LAB — COMPREHENSIVE METABOLIC PANEL
ALT: 22 U/L (ref 0–35)
AST: 19 U/L (ref 0–37)
Albumin: 4.2 g/dL (ref 3.5–5.2)
Alkaline Phosphatase: 41 U/L (ref 39–117)
BILIRUBIN TOTAL: 0.6 mg/dL (ref 0.2–1.2)
BUN: 12 mg/dL (ref 6–23)
CO2: 25 meq/L (ref 19–32)
Calcium: 9.5 mg/dL (ref 8.4–10.5)
Chloride: 108 mEq/L (ref 96–112)
Creatinine, Ser: 0.73 mg/dL (ref 0.40–1.20)
GFR: 102.54 mL/min (ref >60.00)
GLUCOSE: 125 mg/dL — AB (ref 70–99)
Potassium: 3.7 mEq/L (ref 3.5–5.1)
SODIUM: 140 meq/L (ref 135–145)
Total Protein: 6.9 g/dL (ref 6.0–8.3)

## 2016-08-05 LAB — HIV ANTIBODY (ROUTINE TESTING W REFLEX): HIV 1&2 Ab, 4th Generation: NONREACTIVE

## 2016-08-05 LAB — CBC
HEMATOCRIT: 42.2 % (ref 36.0–46.0)
HEMOGLOBIN: 14.1 g/dL (ref 12.0–15.0)
MCHC: 33.3 g/dL (ref 30.0–36.0)
MCV: 84.8 fl (ref 78.0–100.0)
PLATELETS: 278 10*3/uL (ref 150.0–400.0)
RBC: 4.98 Mil/uL (ref 3.87–5.11)
RDW: 13 % (ref 11.5–15.5)
WBC: 7 10*3/uL (ref 4.0–10.5)

## 2016-08-05 LAB — TSH: TSH: 1.73 u[IU]/mL (ref 0.35–4.50)

## 2016-08-05 LAB — VITAMIN B12: VITAMIN B 12: 376 pg/mL (ref 211–911)

## 2016-08-05 LAB — HEMOGLOBIN A1C: Hgb A1c MFr Bld: 5.6 % (ref 4.6–6.5)

## 2016-08-05 MED ORDER — SCOPOLAMINE 1 MG/3DAYS TD PT72: 1.0000 | MEDICATED_PATCH | TRANSDERMAL | 2 refills | Status: DC

## 2016-08-05 NOTE — Patient Instructions (Signed)
It was great to see you today!   Please see if you can find the date of your last tetanus shot (and if it was a Tdap or Td) and let us know when you can I will refer you to GI for a second looks at your symptoms.  We will be in touch with your labs asap- take care, it was nice to see you again today!

## 2016-08-05 NOTE — Progress Notes (Addendum)
[place] at [facility] [address] [place], [place] [address] [phone]  Date:  08/05/2016   Name:  Annette Parks   DOB:  [birth year]   MRN:  [id number]  PCP:  Lamar Blinks, MD    Chief Complaint: Annual Exam (Pt here for CPE. Not fasting pt did have breakfast. Not sure of last tetanus but did have one in the past 10 yrs. Last PAP 06/2016.)   History of Present Illness:  Annette Parks is a 26 y.o. very pleasant female patient who presents with the following:  Here as a new patient to this practice, although I have seen here at [facility] in 2016.  Previous records on file, reviewed.  She sees GYN for her well woman care- pap done last month. She sees Creta Levin - NP- at [facility].  History of migraine HA  She did have a CMP and CBC, Thyroid in October per [facility].  However she would like to go ahead and repeat these and have full labs today.   She has noted some difficulty sleeping and sweating at night- she does notice that this has been a persistent problem over the last couple of years at least She has tried different blankets, and keeps her apt at 66 at night.  However this does not seem to help and she has consistent sweats. 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   She is a never smoker, she enjoys running for exercise but admits she does not always get regular exercise due to her busy schedule  Her family is well; her parents are downsizing from their current home and moving towards [place] to be closer to her brother- he is married and they think he will have kids soon.   Her father has a history of high cholesterol She has a prior history of low B12 and would like to check this today   Gi concern-  She has noted a feeling of a possible anal fissure- she tends to have painful BM and has noted this for "years," but it will come and go. She does not feel  like her stools are hard but just notes that every few days her BM will seem to re-irritate the area and she will have pain agagin She did see Dr. Collene Mares in the last 18 months or so.  Would like another visit but would prefer to be in the [facility] system for insurance purposes.   She used lidocaine gel, and also tried some preparation H.    She is going on a cruise next month and would like a scopolamine patch just in case- she has gotten motion sickness in the past while on a smaller boat  Wt Readings from Last 3 Encounters:  08/05/16 129 lb 12.8 oz (58.9 kg)  05/07/16 128 lb 8 oz (58.3 kg)  04/10/15 122 lb (55.3 kg)     Patient Active Problem List   Diagnosis Date Noted  . Common migraine 05/07/2016  . Eye pain, bilateral 05/07/2016  . Facial pain 05/07/2016  . GAD (generalized anxiety disorder) 03/22/2015  . Neck pain 07/11/2014  . Myalgia 07/11/2014  . Dysesthesia 07/11/2014  . Urinary frequency 07/11/2014    Past Medical History:  Diagnosis Date  . IBS (irritable bowel syndrome)     Past Surgical History:  Procedure Laterality Date  . TONSILLECTOMY AND ADENOIDECTOMY    .  TYMPANOSTOMY TUBE PLACEMENT    . WISDOM TOOTH EXTRACTION      Social History  Substance Use Topics  . Smoking status: Never Smoker  . Smokeless tobacco: Not on file  . Alcohol use 0.0 oz/week     Comment: occasional    Family History  Problem Relation Age of Onset  . Hyperlipidemia Father     No Known Allergies  Medication list has been reviewed and updated.  Current Outpatient Prescriptions on File Prior to Visit  Medication Sig Dispense Refill  . aspirin-acetaminophen-caffeine (EXCEDRIN MIGRAINE) 250-250-65 MG tablet Take by mouth every 6 (six) hours as needed for headache.    . levonorgestrel-ethinyl estradiol (AVIANE,ALESSE,LESSINA) 0.1-20 MG-MCG tablet Take 1 tablet by mouth daily.    Marland Kitchen loratadine (CLARITIN) 10 MG tablet Take 10 mg by mouth daily.    . metoprolol succinate (TOPROL XL)  25 MG 24 hr tablet Take 1 tablet (25 mg total) by mouth daily. 30 tablet 11   Current Facility-Administered Medications on File Prior to Visit  Medication Dose Route Frequency Provider Last Rate Last Dose  . gadopentetate dimeglumine (MAGNEVIST) injection 15 mL  15 mL Intravenous Once PRN Britt Bottom, MD        Review of Systems:  As per HPI- otherwise negative.   Physical Examination: Vitals:   08/05/16 0822  BP: 112/72  Pulse: 99  Temp: 98.3 F (36.8 C)   Vitals:   08/05/16 0822  Weight: 129 lb 12.8 oz (58.9 kg)  Height: 5\' 5"  (1.651 m)   Body mass index is 21.6 kg/m. Ideal Body Weight: Weight in (lb) to have BMI = 25: 149.9  GEN: WDWN, NAD, Non-toxic, A & O x 3, slim build, looks well HEENT: Atraumatic, Normocephalic. Neck supple. No masses, No LAD.  Bilateral TM wnl, oropharynx normal.  PEERL,EOMI.   Ears and Nose: No external deformity. CV: RRR, No M/G/R. No JVD. No thrill. No extra heart sounds. PULM: CTA B, no wheezes, crackles, rhonchi. No retractions. No resp. distress. No accessory muscle use. ABD: S, NT, ND. No rebound. No HSM. EXTR: No c/c/e NEURO Normal gait.  PSYCH: Normally interactive. Conversant. Not depressed or anxious appearing.  Calm demeanor.    Assessment and Plan:    Physical exam - Plan: HIV antibody  Screening for deficiency anemia - Plan: CBC  Family history of hyperlipidemia - Plan: Lipid panel  Family history of B12 deficiency - Plan: B12  Screening for thyroid disorder - Plan: TSH  Screening for diabetes mellitus - Plan: Comprehensive metabolic panel, Hemoglobin A1c, CANCELED: Hemoglobin A1c  Travel advice encounter - Plan: scopolamine (TRANSDERM-SCOP, 1.5 MG,) 1 MG/3DAYS  Pain with bowel movements - Plan: Ambulatory referral to Gastroenterology  Here today for a CPE- labs pending as above Will refer her to GI  Signed Lamar Blinks, MD  Results for orders placed or performed in visit on 08/05/16  CBC  Result Value  Ref Range   WBC 7.0 4.0 - 10.5 K/uL   RBC 4.98 3.87 - 5.11 Mil/uL   Platelets 278.0 150.0 - 400.0 K/uL   Hemoglobin 14.1 12.0 - 15.0 g/dL   HCT 42.2 36.0 - 46.0 %   MCV 84.8 78.0 - 100.0 fl   MCHC 33.3 30.0 - 36.0 g/dL   RDW 13.0 11.5 - 15.5 %  Comprehensive metabolic panel  Result Value Ref Range   Sodium 140 135 - 145 mEq/L   Potassium 3.7 3.5 - 5.1 mEq/L   Chloride 108 96 - 112 mEq/L  CO2 25 19 - 32 mEq/L   Glucose, Bld 125 (H) 70 - 99 mg/dL   BUN 12 6 - 23 mg/dL   Creatinine, Ser 0.73 0.40 - 1.20 mg/dL   Total Bilirubin 0.6 0.2 - 1.2 mg/dL   Alkaline Phosphatase 41 39 - 117 U/L   AST 19 0 - 37 U/L   ALT 22 0 - 35 U/L   Total Protein 6.9 6.0 - 8.3 g/dL   Albumin 4.2 3.5 - 5.2 g/dL   Calcium 9.5 8.4 - 10.5 mg/dL   GFR 102.54 >60.00 mL/min  TSH  Result Value Ref Range   TSH 1.73 0.35 - 4.50 uIU/mL  Lipid panel  Result Value Ref Range   Cholesterol 143 0 - 200 mg/dL   Triglycerides 121.0 0.0 - 149.0 mg/dL   HDL 58.20 >39.00 mg/dL   VLDL 24.2 0.0 - 40.0 mg/dL   LDL Cholesterol 61 0 - 99 mg/dL   Total CHOL/HDL Ratio 2    NonHDL 85.04   HIV antibody  Result Value Ref Range   HIV 1&2 Ab, 4th Generation NONREACTIVE NONREACTIVE  B12  Result Value Ref Range   Vitamin B-12 376 211 - 911 pg/mL  Hemoglobin A1c  Result Value Ref Range   Hgb A1c MFr Bld 5.6 4.6 - 6.5 %   Message to pt- labs look fine

## 2016-08-13 ENCOUNTER — Encounter: Payer: Self-pay | Admitting: Emergency Medicine

## 2016-08-13 NOTE — Progress Notes (Unsigned)
GFR: 80.83 mL/min/1.73 CA 9.7 mg/dL CO2: 26.0 mmol/L CL: 104 mEq/L

## 2016-08-27 MED FILL — METOPROLOL SUCC ER 25 MG TA: 25 | 30 days supply | Qty: 30 | Fill #3

## 2016-08-28 MED FILL — TRANSDERM-SCOP 1.5 MG/3 DAY: 1 | 15 days supply | Qty: 5 | Fill #0

## 2016-09-13 NOTE — Telephone Encounter (Signed)
Done

## 2016-09-19 MED FILL — LEVONOR-ETH ESTRAD 0.1-0.02: 0.1-20 | 84 days supply | Qty: 84 | Fill #0

## 2016-09-20 ENCOUNTER — Other Ambulatory Visit (INDEPENDENT_AMBULATORY_CARE_PROVIDER_SITE_OTHER): Payer: 59

## 2016-09-20 ENCOUNTER — Ambulatory Visit (INDEPENDENT_AMBULATORY_CARE_PROVIDER_SITE_OTHER): Payer: 59 | Admitting: Internal Medicine

## 2016-09-20 ENCOUNTER — Encounter: Payer: Self-pay | Admitting: Internal Medicine

## 2016-09-20 VITALS — BP 100/70 | HR 92 | Ht 65.0 in | Wt 133.1 lb

## 2016-09-20 DIAGNOSIS — K5909 Other constipation: Secondary | ICD-10-CM

## 2016-09-20 DIAGNOSIS — K601 Chronic anal fissure: Secondary | ICD-10-CM

## 2016-09-20 DIAGNOSIS — R198 Other specified symptoms and signs involving the digestive system and abdomen: Secondary | ICD-10-CM | POA: Diagnosis not present

## 2016-09-20 DIAGNOSIS — M6289 Other specified disorders of muscle: Secondary | ICD-10-CM

## 2016-09-20 LAB — IGA: IGA: 165 mg/dL (ref 68–378)

## 2016-09-20 MED ORDER — AMBULATORY NON FORMULARY MEDICATION: 4 refills | Status: DC

## 2016-09-20 NOTE — Patient Instructions (Addendum)
If you are age 26 or older, your body mass index should be between 23-30. Your Body mass index is 22.15 kg/m. If this is out of the aforementioned range listed, please consider follow up with your Primary Care Provider.  If you are age 43 or younger, your body mass index should be between 19-25. Your Body mass index is 22.15 kg/m. If this is out of the aformentioned range listed, please consider follow up with your Primary Care Provider.   Miralax half dose to one dose daily  Please follow up with Dr. Carlean Purl in August. You will be contacted when the schedule comes out.  You have been referred to physical therapy. They will contact you with an appointment.  We have sent the following medications to your pharmacy for you to pick up at your convenience:   Diltiazem/Lidocaine cream.  Thank you.

## 2016-09-20 NOTE — Progress Notes (Signed)
Annette Parks 25 y.o. [birth year] [id number] Referred by: Darreld Mclean, MD  Assessment & Plan:   Encounter Diagnoses  Name Primary?  . Chronic posterior anal fissure Yes  . Chronic constipation   . Abnormal defecation   . Pelvic floor dysfunction    Sounds like she has constipation predominant IBS. Question if she has anismus vs.other disordered defecation. He has urinary symptoms and well so I'm fairly confident she has pelvic floor dysfunction.  Treatment and evaluation as below Miralax 1/2-1 dose daily Pelvic PT evaluation and treatment Diltiazem and lidocaine cream twice a day to 3 times a day into the anus for fissure and spasm. RTC Aug TTG Ab IgA screen for celiac disease which is a possible contributor  I appreciate the opportunity to care for this patient. 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 She reports many years of constipation even as a child she says her parents had to give her enemas. Now a day she says that she moves her bowels fairly regularly during the week when she has a schedule with work but during the weekends it gets off. If she does not move her bowels for a day or 2 then she'll have multiple bowel movements starting out as hard and progressively looser. If she has a hard large bowel movement she'll have pain in the anal area with bleeding. Never been able to completely get relief of this fissure. There is bright red blood per rectum at times. She also has urgent defecation frequently. There is also urinary urgency and symptoms of UTI. Do not think she's had a urologic evaluation. She says that when she does not move her bowels for a couple of days she gets "pain in my kidneys". On she defecates that flank and back pain is gone. She saw  Dr. Collene Mares at some point, who prescribed a probiotic for bloating, Benefiber which gave the patient more bloating, and over-the-counter topical lidocaine. As far she know she has never had a colonoscopy or any other type of investigation for this. She reports that foods like positive with red sauce and some other week containing foods may make things worse. If she eats chicken vegetables fish and rice she tends to be okay. Occasional heartburn. It does not sound like she has tried anti-spasmodic.  She is concerned because of persistence of her issues, it might be worsening, and her father recently had to have colonic resection for diverticulitis and she would like to avoid any future problems if she could.  No Known Allergies   Current Meds  Medication Sig  . aspirin-acetaminophen-caffeine (EXCEDRIN MIGRAINE) 250-250-65 MG tablet Take by mouth every 6 (six) hours as needed for headache.  . diphenhydrAMINE (BENADRYL) 25 MG tablet Take 25 mg by mouth as needed.  . fluticasone (FLONASE) 50 MCG/ACT nasal spray Place into both nostrils as needed for allergies or rhinitis.  Marland Kitchen levonorgestrel-ethinyl estradiol (AVIANE,ALESSE,LESSINA) 0.1-20 MG-MCG tablet Take 1 tablet by mouth daily.  Marland Kitchen loratadine (CLARITIN) 10 MG tablet Take 10 mg by mouth daily.    Past Medical History:  Diagnosis Date  . Anal fissure   . IBS (irritable bowel syndrome)   . Migraine headache    Past Surgical History:  Procedure Laterality Date  . EAR TUBE REMOVAL    . TONSILLECTOMY AND  ADENOIDECTOMY    . TYMPANOPLASTY Right   . TYMPANOSTOMY TUBE PLACEMENT     x 3  . WISDOM TOOTH EXTRACTION     Social History   Social History  . Marital status: Single       . Number of children: 0  .     Occupational History  . Registered nurse    Social History Main Topics  . Smoking status: Never Smoker  . Smokeless tobacco: Never Used  . Alcohol use 0.0 oz/week     Comment: occasional-2 per month  . Drug use: No   Social  History Narrative   Single, no children    moved here from [place] went to [employer]. Moved here after her parents moved here.   Clinic nurse in [employer]      family history includes Asthma in her brother; Diabetes in her maternal grandmother; Diverticulitis in her father; Hyperlipidemia in her father; Migraines in her mother.  Review of Systems As above. All other review of systems are negative.  Objective:   Physical Exam @BP  100/70 (BP Location: Left Arm, Patient Position: Sitting, Cuff Size: Normal)   Pulse 92   Ht 5\' 5"  (1.651 m) Comment: height measured without shoes  Wt 133 lb 2 oz (60.4 kg)   LMP 09/17/2016   BMI 22.15 kg/m @  General:  Well-developed, well-nourished and in no acute distress Eyes:  anicteric. Lungs: Clear to auscultation bilaterally. Heart:  S1S2, no rubs, murmurs, gallops. Abdomen:  soft, non-tender, no hepatosplenomegaly, hernia, or mass and BS+.  Rectal:  Physicist, medical medical student present  Risen normal anoderm. The anocutaneous reflex is absent. Normal resting tone. Somewhat tender posteriorly. There is some mild anal spasm. This is improved with the application of nitroglycerin 0.125% and 5% lidocaine.   There is a strong and good voluntary squeeze.   Simulated defecation reveals appropriate abdominal contraction but paradoxical contraction and elevation of the anus and rectum.  Lymph:  no cervical or supraclavicular adenopathy. Extremities:   no edema, cyanosis or clubbing Skin   no rash. Neuro:  A&O x 3.  Psych:  appropriate mood and  Affect.   Data Reviewed:  Primary care notes. Normal TSH. Normal CBC. Normal metabolic panel. These labs were in April 2018. B12 level normal also.

## 2016-09-24 LAB — TISSUE TRANSGLUTAMINASE ABS,IGG,IGA
TISSUE TRANSGLUT AB: 1 U/mL (ref <6)
TISSUE TRANSGLUTAMINASE AB, IGA: 1 U/mL (ref <4)

## 2016-09-25 NOTE — Progress Notes (Signed)
My Chart message to patient Neg celiac dz

## 2016-09-30 ENCOUNTER — Encounter: Payer: Self-pay | Admitting: Internal Medicine

## 2016-10-22 ENCOUNTER — Telehealth: Payer: Self-pay | Admitting: Internal Medicine

## 2016-10-22 DIAGNOSIS — M6289 Other specified disorders of muscle: Secondary | ICD-10-CM

## 2016-10-22 NOTE — Telephone Encounter (Signed)
New referral placed to [facility].  I did provide the phone number to her in the event she does not hear from them again in the next week.

## 2016-12-07 MED FILL — LEVONOR-ETH ESTRAD 0.1-0.02: 0.1-20 | 84 days supply | Qty: 84 | Fill #1

## 2016-12-23 ENCOUNTER — Ambulatory Visit (INDEPENDENT_AMBULATORY_CARE_PROVIDER_SITE_OTHER): Payer: 59 | Admitting: Internal Medicine

## 2016-12-23 ENCOUNTER — Encounter: Payer: Self-pay | Admitting: Internal Medicine

## 2016-12-23 VITALS — BP 92/70 | HR 103 | Ht 65.0 in | Wt 132.0 lb

## 2016-12-23 DIAGNOSIS — M6289 Other specified disorders of muscle: Secondary | ICD-10-CM

## 2016-12-23 DIAGNOSIS — K601 Chronic anal fissure: Secondary | ICD-10-CM

## 2016-12-23 DIAGNOSIS — K5909 Other constipation: Secondary | ICD-10-CM | POA: Diagnosis not present

## 2016-12-23 MED ORDER — POLYETHYLENE GLYCOL 3350 17 G PO PACK: PACK | ORAL | 0 refills | Status: DC

## 2016-12-23 NOTE — Patient Instructions (Signed)
   I am glad you are better.  Keep taking the MiraLax and adjusting so you do not get cramps.  I recommend continuing the rectal diltiazem and lidocaine for 1 month after all feels well - at least 1x/day.  Message me when ready to do the PT evaluation.  I appreciate the opportunity to care for you. Gatha Mayer, MD, [facility]

## 2016-12-23 NOTE — Addendum Note (Signed)
Addended by: Gatha Mayer on: 12/23/2016 04:42 PM   Modules accepted: Level of Service

## 2016-12-23 NOTE — Progress Notes (Addendum)
   Jasreet Dickie 26 y.o. [birth year] [id number]  Assessment & Plan:   Encounter Diagnoses  Name Primary?  . Chronic posterior anal fissure Yes  . Chronic constipation   . Pelvic floor dysfunction ?     He is significantly improved. I have recommended that she continue to take diltiazem and lidocaine rectal topical treatment for at least a month after she feels completely well. At least one time a day. She will continue to titrate the MiraLAX for her affect help her constipation. When she is able to get some time away from work she is open to considering at least a one-time physical therapy evaluation. She will let me know through my chart. Follow-up as needed.   Subjective:   Chief Complaint:Anal fissure  HPI patient reports that she has not had any bleeding or pain; constipation has improved with Miralax and diet changes he had use her MiraLAX a few times a week, as when she takes it every day in a row after about the third day she gets crampy. She's having just minimal pain in the anal area. No bleeding. Overall pleased with things. She had backed off and then restarted using the diltiazem and lidocaine topical treatment. She was going to do the physical therapy referral, but somehow that didn't get transmitted properly and they are short staffed at work so she can't take any time off for that right now anyway. She plans to do that later. No Known Allergies Current Meds  Medication Sig  . AMBULATORY NON FORMULARY MEDICATION 2% diltiazem cream and 5% lidocaine cream 1:1 Apply small amount into rectum bid-tid  . aspirin-acetaminophen-caffeine (EXCEDRIN MIGRAINE) 250-250-65 MG tablet Take by mouth every 6 (six) hours as needed for headache.  . levonorgestrel-ethinyl estradiol (AVIANE,ALESSE,LESSINA) 0.1-20 MG-MCG tablet Take 1 tablet by mouth daily.  Marland Kitchen loratadine (CLARITIN) 10 MG tablet Take 10 mg by mouth daily.  MiraLAX several times a week Past Medical History:  Diagnosis Date  . Anal  fissure   . IBS (irritable bowel syndrome)   . Migraine headache   . Pelvic floor dysfunction?    Past Surgical History:  Procedure Laterality Date  . EAR TUBE REMOVAL    . TONSILLECTOMY AND ADENOIDECTOMY    . TYMPANOPLASTY Right   . TYMPANOSTOMY TUBE PLACEMENT     x 3  . WISDOM TOOTH EXTRACTION      Review of Systems As above  Objective:   Physical Exam BP 92/70   Pulse (!) 103   Ht 5\' 5"  (1.651 m)   Wt 132 lb (59.9 kg)   BMI 21.97 kg/m  No acute distress        15 minutes time spent with patient > half in counseling coordination of care

## 2017-01-20 MED FILL — PREVIDENT 5000 BOOSTER PLUS: 1.1 | 30 days supply | Qty: 200 | Fill #0

## 2017-02-27 MED FILL — LEVONOR-ETH ESTRAD 0.1-0.02: 0.1-20 | 84 days supply | Qty: 84 | Fill #2

## 2017-04-11 DIAGNOSIS — H52223 Regular astigmatism, bilateral: Secondary | ICD-10-CM | POA: Diagnosis not present

## 2017-05-08 ENCOUNTER — Encounter: Payer: Self-pay | Admitting: Family Medicine

## 2017-05-08 ENCOUNTER — Ambulatory Visit: Payer: 59 | Admitting: Family Medicine

## 2017-05-08 VITALS — BP 130/88 | HR 92 | Temp 98.2°F | Ht 65.0 in | Wt 132.0 lb

## 2017-05-08 DIAGNOSIS — G8929 Other chronic pain: Secondary | ICD-10-CM

## 2017-05-08 DIAGNOSIS — N390 Urinary tract infection, site not specified: Secondary | ICD-10-CM | POA: Diagnosis not present

## 2017-05-08 DIAGNOSIS — R350 Frequency of micturition: Secondary | ICD-10-CM

## 2017-05-08 DIAGNOSIS — M545 Low back pain: Secondary | ICD-10-CM

## 2017-05-08 DIAGNOSIS — H9202 Otalgia, left ear: Secondary | ICD-10-CM | POA: Diagnosis not present

## 2017-05-08 LAB — POCT URINALYSIS DIPSTICK
Bilirubin, UA: NEGATIVE
GLUCOSE UA: NEGATIVE
Ketones, UA: NEGATIVE
Leukocytes, UA: NEGATIVE
Nitrite, UA: NEGATIVE
Protein, UA: NEGATIVE
Spec Grav, UA: >=1.03 — AB (ref 1.010–1.025)
Urobilinogen, UA: 0.2 E.U./dL
pH, UA: 6 (ref 5.0–8.0)

## 2017-05-08 NOTE — Patient Instructions (Signed)
It was nice to see you today-  I am so sorry that your friend is so terribly ill I will be in touch with your urine and blood work asap, and will refer you to urology. Will have you see the [facility] practice in [place] if in network Let me know if your ear/ throat does not continue to get better

## 2017-05-08 NOTE — Progress Notes (Addendum)
[place] at [facility] [address] [place], [place] [address] [phone]  Date:  05/08/2017   Name:  Annette Parks   DOB:  Mar 29, 1991   MRN:  [id number]  PCP:  Darreld Mclean, MD    Chief Complaint: Sinusitis (Pt states that it started out as ear pain in left ear and shortly after there was nasla drainage and it eventually got better. ) and Urinary Tract Infection (Has frequent UTI's and has a weak stream, has urge to go even when there's not much. Can't sleep throughout the night. )   History of Present Illness:  Annette Parks is a 27 y.o. very pleasant female patient who presents with the following:  Today is Thursday. On Monday she noted left ear pain, she started some sinus meds. 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  We are not sure if she had a culture done or not Right now her urinary sx are really stable, but she feels like she has urinary urgency She has been noted to have some blood in her urine on a few occasions   She generally does not sleep through the night as she has to void- generally just once.  When she wakes up to urinate she will have back pain Sex tends to cause a UTI, this makes her not want to have intercourse with her BF  This has been a stressful time- her BF's best friend (also a friend of hers) has end stage colon cancer and is expected to live for another couple of months at best  Patient Active Problem List   Diagnosis Date  Noted  . Common migraine 05/07/2016  . Eye pain, bilateral 05/07/2016  . Facial pain 05/07/2016  . GAD (generalized anxiety disorder) 03/22/2015  . Neck pain 07/11/2014  . Myalgia 07/11/2014  . Dysesthesia 07/11/2014  . Urinary frequency 07/11/2014    Past Medical History:  Diagnosis Date  . Anal fissure   . IBS (irritable bowel syndrome)   . Migraine headache   . Pelvic floor dysfunction?     Past Surgical History:  Procedure Laterality Date  . EAR TUBE REMOVAL    . TONSILLECTOMY AND ADENOIDECTOMY    . TYMPANOPLASTY Right   . TYMPANOSTOMY TUBE PLACEMENT     x 3  . WISDOM TOOTH EXTRACTION      Social History   Tobacco Use  . Smoking status: Never Smoker  . Smokeless tobacco: Never Used  Substance Use Topics  . Alcohol use: Yes    Alcohol/week: 0.0 oz    Comment: occasional-2 per month  . Drug use: No    Family History  Problem Relation Age of Onset  . Migraines Mother   . Hyperlipidemia Father   . Diverticulitis Father        had to have part of colon removed  .  Asthma Brother   . Diabetes Maternal Grandmother     No Known Allergies  Medication list has been reviewed and updated.  Current Outpatient Medications on File Prior to Visit  Medication Sig Dispense Refill  . AMBULATORY NON FORMULARY MEDICATION 2% diltiazem cream and 5% lidocaine cream 1:1 Apply small amount into rectum bid-tid 30 g 4  . aspirin-acetaminophen-caffeine (EXCEDRIN MIGRAINE) 250-250-65 MG tablet Take by mouth every 6 (six) hours as needed for headache.    . levonorgestrel-ethinyl estradiol (AVIANE,ALESSE,LESSINA) 0.1-20 MG-MCG tablet Take 1 tablet by mouth daily.    Marland Kitchen loratadine (CLARITIN) 10 MG tablet Take 10 mg by mouth daily.    . polyethylene glycol (MIRALAX) packet 17 g daily as needed may reduce dose also 14 each 0   Current Facility-Administered Medications on File Prior to Visit  Medication Dose Route Frequency Provider Last Rate Last Dose  . gadopentetate dimeglumine  (MAGNEVIST) injection 15 mL  15 mL Intravenous Once PRN Sater, Nanine Means, MD        Review of Systems:  As per HPI- otherwise negative.   Physical Examination: Vitals:   05/08/17 1744  BP: 130/88  Pulse: 92  Temp: 98.2 F (36.8 C)  SpO2: 98%   Vitals:   05/08/17 1744  Weight: 132 lb (59.9 kg)  Height: 5\' 5"  (1.651 m)   Body mass index is 21.97 kg/m. Ideal Body Weight: Weight in (lb) to have BMI = 25: 149.9  GEN: WDWN, NAD, Non-toxic, A & O x 3, slim build, looks well HEENT: Atraumatic, Normocephalic. Neck supple. No masses, No LAD.  Scarring of left TM but no sign of acute infection.  Right TM shows post- surgical changes and chronic defect oropharynx normal.  PEERL,EOMI.   Throat and neck are normal to exam Ears and Nose: No external deformity. CV: RRR, No M/G/R. No JVD. No thrill. No extra heart sounds. PULM: CTA B, no wheezes, crackles, rhonchi. No retractions. No resp. distress. No accessory muscle use. ABD: S, NT, ND, +BS. No rebound. No HSM. EXTR: No c/c/e NEURO Normal gait.  PSYCH: Normally interactive. Conversant. Not depressed or anxious appearing.  Calm demeanor.   Results for orders placed or performed in visit on 05/08/17  POCT Urinalysis Dipstick  Result Value Ref Range   Color, UA yellow    Clarity, UA cloudy    Glucose, UA neg    Bilirubin, UA neg    Ketones, UA neg    Spec Grav, UA >=1.030 (A) 1.010 - 1.025   Blood, UA 1+    pH, UA 6.0 5.0 - 8.0   Protein, UA neg    Urobilinogen, UA 0.2 0.2 or 1.0 E.U./dL   Nitrite, UA neg    Leukocytes, UA Negative Negative   Appearance     Odor      Assessment and Plan: Chronic bilateral low back pain without sciatica  Urine frequency - Plan: POCT Urinalysis Dipstick, Urine Microscopic Only, CBC, Comprehensive metabolic panel, Urine Culture, CANCELED: CULTURE, URINE COMPREHENSIVE  Left ear pain  Recurrent UTI - Plan: Ambulatory referral to Urology  Here today with a couple of concerns  Await urine  culture. Offered macrobid to take after intercourse but she would prefer not to use this right now Referral to urology to re-visit her chronic urinary concerns Her ear is much better- she will alert me if this does not continue to improve    Signed Lamar Blinks, MD  Received her labs as below, 1/12 Results for orders placed or performed  in visit on 05/08/17  Urine Culture  Result Value Ref Range   MICRO NUMBER: [id number]    SPECIMEN QUALITY: ADEQUATE    Sample Source URINE    STATUS: FINAL    Result:      Single organism less than 10,000 CFU/mL isolated. These organisms, commonly found on external and internal genitalia, are considered colonizers. No further testing performed.  Urine Microscopic Only  Result Value Ref Range   WBC, UA none seen 0-2/hpf   RBC / HPF 0-2/hpf 0-2/hpf   Squamous Epithelial / LPF Rare(0-4/hpf) Rare(0-4/hpf)   Bacteria, UA Rare(<10/hpf) (A) None   Ca Oxalate Crys, UA Presence of (A) None  CBC  Result Value Ref Range   WBC 8.0 4.0 - 10.5 K/uL   RBC 5.22 (H) 3.87 - 5.11 Mil/uL   Platelets 286.0 150.0 - 400.0 K/uL   Hemoglobin 14.3 12.0 - 15.0 g/dL   HCT 44.0 36.0 - 46.0 %   MCV 84.4 78.0 - 100.0 fl   MCHC 32.4 30.0 - 36.0 g/dL   RDW 13.0 11.5 - 15.5 %  Comprehensive metabolic panel  Result Value Ref Range   Sodium 138 135 - 145 mEq/L   Potassium 4.1 3.5 - 5.1 mEq/L   Chloride 100 96 - 112 mEq/L   CO2 30 19 - 32 mEq/L   Glucose, Bld 87 70 - 99 mg/dL   BUN 11 6 - 23 mg/dL   Creatinine, Ser 0.70 0.40 - 1.20 mg/dL   Total Bilirubin 0.5 0.2 - 1.2 mg/dL   Alkaline Phosphatase 57 39 - 117 U/L   AST 27 0 - 37 U/L   ALT 30 0 - 35 U/L   Total Protein 7.5 6.0 - 8.3 g/dL   Albumin 4.6 3.5 - 5.2 g/dL   Calcium 9.6 8.4 - 10.5 mg/dL   GFR 107.00 >60.00 mL/min  POCT Urinalysis Dipstick  Result Value Ref Range   Color, UA yellow    Clarity, UA cloudy    Glucose, UA neg    Bilirubin, UA neg    Ketones, UA neg    Spec Grav, UA >=1.030 (A) 1.010 -  1.025   Blood, UA 1+    pH, UA 6.0 5.0 - 8.0   Protein, UA neg    Urobilinogen, UA 0.2 0.2 or 1.0 E.U./dL   Nitrite, UA neg    Leukocytes, UA Negative Negative   Appearance     Odor

## 2017-05-09 LAB — COMPREHENSIVE METABOLIC PANEL
ALK PHOS: 57 U/L (ref 39–117)
ALT: 30 U/L (ref 0–35)
AST: 27 U/L (ref 0–37)
Albumin: 4.6 g/dL (ref 3.5–5.2)
BILIRUBIN TOTAL: 0.5 mg/dL (ref 0.2–1.2)
BUN: 11 mg/dL (ref 6–23)
CALCIUM: 9.6 mg/dL (ref 8.4–10.5)
CO2: 30 meq/L (ref 19–32)
CREATININE: 0.7 mg/dL (ref 0.40–1.20)
Chloride: 100 mEq/L (ref 96–112)
GFR: 107 mL/min (ref >60.00)
GLUCOSE: 87 mg/dL (ref 70–99)
Potassium: 4.1 mEq/L (ref 3.5–5.1)
Sodium: 138 mEq/L (ref 135–145)
TOTAL PROTEIN: 7.5 g/dL (ref 6.0–8.3)

## 2017-05-09 LAB — CBC
HCT: 44 % (ref 36.0–46.0)
Hemoglobin: 14.3 g/dL (ref 12.0–15.0)
MCHC: 32.4 g/dL (ref 30.0–36.0)
MCV: 84.4 fl (ref 78.0–100.0)
PLATELETS: 286 10*3/uL (ref 150.0–400.0)
RBC: 5.22 Mil/uL — ABNORMAL HIGH (ref 3.87–5.11)
RDW: 13 % (ref 11.5–15.5)
WBC: 8 10*3/uL (ref 4.0–10.5)

## 2017-05-09 LAB — URINALYSIS, MICROSCOPIC ONLY: WBC UA: NONE SEEN (ref 0–?)

## 2017-05-10 ENCOUNTER — Encounter: Payer: Self-pay | Admitting: Family Medicine

## 2017-05-10 LAB — URINE CULTURE
MICRO NUMBER: 90046422
SPECIMEN QUALITY: ADEQUATE

## 2017-05-13 ENCOUNTER — Encounter: Payer: Self-pay | Admitting: Family Medicine

## 2017-05-14 ENCOUNTER — Encounter: Payer: Self-pay | Admitting: Family Medicine

## 2017-05-15 MED FILL — LEVONOR-ETH ESTRAD 0.1-0.02: 0.1-20 | 84 days supply | Qty: 84 | Fill #0

## 2017-05-26 ENCOUNTER — Telehealth: Payer: 59 | Admitting: Nurse Practitioner

## 2017-05-26 DIAGNOSIS — J0100 Acute maxillary sinusitis, unspecified: Secondary | ICD-10-CM | POA: Diagnosis not present

## 2017-05-26 MED ORDER — AMOXICILLIN-POT CLAVULANATE 875-125 MG PO TABS: 1.0000 | ORAL_TABLET | Freq: Two times a day (BID) | ORAL | 0 refills | Status: DC

## 2017-05-26 MED FILL — AMOX TR-K CLV 875-125 MG TA: 875-125 | 7 days supply | Qty: 14 | Fill #0

## 2017-05-26 NOTE — Progress Notes (Signed)

## 2017-05-30 ENCOUNTER — Ambulatory Visit: Payer: 59 | Admitting: Internal Medicine

## 2017-05-30 ENCOUNTER — Encounter: Payer: Self-pay | Admitting: Internal Medicine

## 2017-05-30 VITALS — BP 108/60 | HR 87 | Temp 98.1°F | Resp 14 | Ht 65.0 in | Wt 133.5 lb

## 2017-05-30 DIAGNOSIS — J019 Acute sinusitis, unspecified: Secondary | ICD-10-CM

## 2017-05-30 MED ORDER — BENZONATATE 100 MG PO CAPS: 100.0000 mg | ORAL_CAPSULE | Freq: Three times a day (TID) | ORAL | 0 refills | Status: DC | PRN

## 2017-05-30 MED ORDER — PREDNISONE 10 MG PO TABS: ORAL_TABLET | ORAL | 0 refills | Status: DC

## 2017-05-30 MED FILL — BENZONATATE 100 MG CAPSULE: 100 | 5 days supply | Qty: 30 | Fill #0

## 2017-05-30 MED FILL — predniSONE 10 MG TABS: 10 | 8 days supply | Qty: 20 | Fill #0

## 2017-05-30 NOTE — Patient Instructions (Signed)
Rest, fluids , tylenol  For cough:  Take Mucinex DM twice a day as needed until better Also Tessalon as needed   Continue the antibiotics  Take the prednisone as prescribed  For nasal congestion: Use OTC  Flonase : 2 nasal sprays on each side of the nose in the morning until you feel better   Get pseudoephedrine 30 mg (behind the counter, you need to talk with the pharmacist) take one tablet 3 or 4 times a day as needed for congestion  Call if not gradually better over the next  7 days  Call anytime if the symptoms are severe

## 2017-05-30 NOTE — Progress Notes (Signed)
Pre visit review using our clinic review tool, if applicable. No additional management support is needed unless otherwise documented below in the visit note. 

## 2017-05-30 NOTE — Progress Notes (Signed)
Subjective:    Patient ID: Annette Parks, female    DOB: Sep 21, 1990, 27 y.o.   MRN: [id number]  DOS:  05/30/2017 Type of visit - description : Acute Interval history:  Symptoms started a week ago with sore throat, tender lymph nodes in the neck, she started to take OTCs. About 5 days ago she also developed severe nasal congestion and discharge, had a E-visit, was prescribed Augmentin which she has been taking. Here because she is not feeling better.  Currently continue with severe nasal congestion and pain at both sides of the sinuses, episodes of cough with clear to yellowish sputum, left ear pain when she blows her nose.   Review of Systems Reports low-grade fever with the onset of symptoms, no chills. Denies any left ear discharge No nausea, vomiting, diarrhea No chest congestion.  Past Medical History:  Diagnosis Date  . Anal fissure   . IBS (irritable bowel syndrome)   . Migraine headache   . Pelvic floor dysfunction?     Past Surgical History:  Procedure Laterality Date  . EAR TUBE REMOVAL    . TONSILLECTOMY AND ADENOIDECTOMY    . TYMPANOPLASTY Right   . TYMPANOSTOMY TUBE PLACEMENT     x 3  . WISDOM TOOTH EXTRACTION      Social History   Socioeconomic History  . Marital status: Single    Spouse name: Not on file  . Number of children: 0  . Years of education: Not on file  . Highest education level: Not on file  Social Needs  . Financial resource strain: Not on file  . Food insecurity - worry: Not on file  . Food insecurity - inability: Not on file  . Transportation needs - medical: Not on file  . Transportation needs - non-medical: Not on file  Occupational History  . Occupation: Equities trader  Tobacco Use  . Smoking status: Never Smoker  . Smokeless tobacco: Never Used  Substance and Sexual Activity  . Alcohol use: Yes    Alcohol/week: 0.0 oz    Comment: occasional-2 per month  . Drug use: No  . Sexual activity: Not on file  Other Topics Concern   . Not on file  Social History Narrative   Single, no children    moved here from [place] went to [facility]. Moved here after her parents moved here.   Clinic nurse in [employer]      Allergies as of 05/30/2017   No Known Allergies     Medication List        Accurate as of 05/30/17 11:59 PM. Always use your most recent med list.          AMBULATORY NON FORMULARY MEDICATION 2% diltiazem cream and 5% lidocaine cream 1:1 Apply small amount into rectum bid-tid   amoxicillin-clavulanate 875-125 MG tablet Commonly known as:  AUGMENTIN Take 1 tablet by mouth 2 (two) times daily.   aspirin-acetaminophen-caffeine 250-250-65 MG tablet Commonly known as:  EXCEDRIN MIGRAINE Take by mouth every 6 (six) hours as needed for headache.   benzonatate 100 MG capsule Commonly known as:  TESSALON PERLES Take 1-2 capsules (100-200 mg total) by mouth 3 (three) times daily as needed for cough.   levonorgestrel-ethinyl estradiol 0.1-20 MG-MCG tablet Commonly known as:  AVIANE,ALESSE,LESSINA Take 1 tablet by mouth daily.   loratadine 10 MG tablet Commonly known as:  CLARITIN Take 10 mg by mouth daily.   polyethylene glycol packet Commonly known as:  MIRALAX 17  g daily as needed may reduce dose also   predniSONE 10 MG tablet Commonly known as:  DELTASONE 4 tablets x 2 days, 3 tabs x 2 days, 2 tabs x 2 days, 1 tab x 2 days          Objective:   Physical Exam BP 108/60 (BP Location: Left Arm, Patient Position: Sitting, Cuff Size: Small)   Pulse 87   Temp 98.1 F (36.7 C) (Oral)   Resp 14   Ht 5\' 5"  (1.651 m)   Wt 133 lb 8 oz (60.6 kg)   LMP 05/27/2017 (Exact Date)   SpO2 96%   BMI 22.22 kg/m  General:   Well developed, well nourished . NAD.  HEENT:  Normocephalic . Face symmetric, atraumatic Right TM: Seems to have scars and small perf; not red or swollen. Left TM: Bulge, but no redness or perforation. Throat symmetric, no red or  discharge Sinuses: + TTP at the bilateral maxillary areas, note that the frontal areas Nose quite congested Lungs:  CTA B Normal respiratory effort, no intercostal retractions, no accessory muscle use. Heart: RRR,  no murmur.  No pretibial edema bilaterally  Skin: Not pale. Not jaundice Neurologic:  alert & oriented X3.  Speech normal, gait appropriate for age and unassisted Psych--  Cognition and judgment appear intact.  Cooperative with normal attention span and concentration.  Behavior appropriate. No anxious or depressed appearing.      Assessment & Plan:  27 year old lady with history of migraines, on birth control pills,  R TM perf, presents with  Symptoms consistent with sinusitis. She started Augmentin which should take care of the infection, however she is very congested and is still very symptomatic. Plan: Continue Augmentin, steroids by mouth, Mucinex, Tessalon, Flonase, call if not gradually better.  Okay to use a small amount of pseudoephedrine if needed.

## 2017-06-04 ENCOUNTER — Encounter: Payer: Self-pay | Admitting: Family Medicine

## 2017-06-05 ENCOUNTER — Telehealth: Payer: Self-pay | Admitting: Family Medicine

## 2017-06-05 NOTE — Telephone Encounter (Signed)
Copied from [place]. Topic: Quick Communication - See Telephone Encounter >> Jun 05, 2017  4:28 PM Annette Parks, NT wrote: CRM for notification. See Telephone encounter for:   06/05/17. Pt. Would like a refill on Augmentin but would like to have Dr. Lorelei Parks or nurse give her a call back before having something called in. Pt. Is still having symptoms that have not went away and seem to be getting worst and it has been 2 weeks. Pt. Also did not want to make an appt. Or talk with NT. Stated she wanted a call from from Dr. Jaymes Parks nurse in the office. Pt. Can be reached at [phone]

## 2017-06-06 MED ORDER — ALBUTEROL SULFATE HFA 108 (90 BASE) MCG/ACT IN AERS: 2.0000 | INHALATION_SPRAY | Freq: Four times a day (QID) | RESPIRATORY_TRACT | 0 refills | Status: DC | PRN

## 2017-06-06 MED ORDER — DOXYCYCLINE HYCLATE 100 MG PO CAPS: 100.0000 mg | ORAL_CAPSULE | Freq: Two times a day (BID) | ORAL | 0 refills | Status: DC

## 2017-06-06 NOTE — Telephone Encounter (Signed)
I called her back- she saw Dr. Larose Kells a week ago.  She still has some PND, and has noted a cough over the last few days

## 2017-06-20 DIAGNOSIS — Z87440 Personal history of urinary (tract) infections: Secondary | ICD-10-CM | POA: Diagnosis not present

## 2017-06-20 DIAGNOSIS — R350 Frequency of micturition: Secondary | ICD-10-CM | POA: Diagnosis not present

## 2017-07-03 ENCOUNTER — Other Ambulatory Visit: Payer: Self-pay | Admitting: Family Medicine

## 2017-07-17 DIAGNOSIS — Z01419 Encounter for gynecological examination (general) (routine) without abnormal findings: Secondary | ICD-10-CM | POA: Diagnosis not present

## 2017-07-17 DIAGNOSIS — Z6822 Body mass index (BMI) 22.0-22.9, adult: Secondary | ICD-10-CM | POA: Diagnosis not present

## 2017-07-31 ENCOUNTER — Encounter: Payer: Self-pay | Admitting: Neurology

## 2017-07-31 ENCOUNTER — Ambulatory Visit: Payer: 59 | Admitting: Neurology

## 2017-07-31 DIAGNOSIS — S336XXA Sprain of sacroiliac joint, initial encounter: Secondary | ICD-10-CM | POA: Diagnosis not present

## 2017-07-31 DIAGNOSIS — M7062 Trochanteric bursitis, left hip: Secondary | ICD-10-CM | POA: Diagnosis not present

## 2017-07-31 NOTE — Progress Notes (Signed)
[facility]  PATIENT: Annette Parks DOB: 07-02-1990  REFERRING DOCTOR OR PCP:  [facility], NP) SOURCE: Patient22  _________________________________   HISTORICAL  CHIEF COMPLAINT:  Chief Complaint  Patient presents with  . Back Pain    Low back, left hip pain, worse with exercise/fim    HISTORY OF PRESENT ILLNESS:  Annette Parks is a 27 yo woman with several severe headaches over the last few months   Update 07/31/2017: She is reporting left midline sacral pain that has ben off/on x 1 year but more constant and worsening the past month.  She notes she is jogging and using the elliptical more.    Pain increases with sitting and is best if she lays straight down.     A thermal patch is helping some.    Ibuprofen has not helped and she does not want to try higher dose due to GERD.     She notes left lateral hip pain when she runs off/on x a few years.  .   It usually gets better with rest.    NSAIDs have not helped.    She does not note weakness or numbness in her legs.    She has had painful bladder pain at times and has been referred to urology.    A bladder scan had been fine in the past.   From 05/07/2016: She had many migraines when she was young (before age 42) and was on a preventative for several years.    At that time, she would get one every week.    These were severe,   She does not recall the names of the prophylactic or preventative.  Migraines improved as a teenager and she was able to go off med's.  Over the summer, she had a severe migraine shortly after she started paroxetine.   In October, she had a migraine and went to Urgent Care.  Toradol did not help.  A sinus x-ray was reportedly clear.    Last week, she had a severe migraine lasting 8 hours with nausea, photophobia and phonophobia.    Sumatriptan did not help.     She still feels sore around the eyes since then.  The pain has not improved with supraorbital nerve blocks, sumatriptan  injection, anti-inflammatories including Toradol injection.  One of her headaches occurred around her period.    Before the headaches, she had a puffy sensation in her eyes but no visual aura.   She still has intermittent neck pain.   She was told by orthopedics that her posture is exacerbating her pain and she is trying to do better.    This has helped some. Massage also helps.  Pain worsens when she sits for long period of time. She denies any pain that radiates down into the arms.  She had an xray showing 'reverse lordosis' .  MRI of Cervical spine showed no significant degenerative changes and no nerve root compression. Straightening of the cervical curvature was noted   REVIEW OF SYSTEMS: Constitutional: No fevers, chills, sweats, or change in appetite Eyes: No visual changes, double vision, eye pain Ear, nose and throat: No hearing loss, ear pain, nasal congestion, sore throat Cardiovascular: No chest pain, palpitations Respiratory: No shortness of breath at rest or with exertion.   No wheezes GastrointestinaI: No nausea, vomiting, diarrhea, abdominal pain, fecal incontinence Genitourinary: Some frequency.  Occ nocturia. Musculoskeletal: as above Integumentary: No rash, pruritus, skin lesions Neurological: as above Psychiatric: No depression  at this time.  No anxiety Endocrine: No palpitations, diaphoresis, change in appetite, change in weigh or increased thirst Hematologic/Lymphatic: No anemia, purpura, petechiae. Allergic/Immunologic: Some seasonal allergies.  No rashes  ALLERGIES: No Known Allergies  HOME MEDICATIONS:  Current Outpatient Medications:  .  albuterol (PROVENTIL HFA;VENTOLIN HFA) 108 (90 Base) MCG/ACT inhaler, TAKE 2 PUFFS BY MOUTH EVERY 6 HOURS AS NEEDED FOR WHEEZE OR SHORTNESS OF BREATH, Disp: 8.5 Inhaler, Rfl: 0 .  aspirin-acetaminophen-caffeine (EXCEDRIN MIGRAINE) 250-250-65 MG tablet, Take by mouth every 6 (six) hours as needed for headache., Disp: , Rfl:    .  AMBULATORY NON FORMULARY MEDICATION, 2% diltiazem cream and 5% lidocaine cream 1:1 Apply small amount into rectum bid-tid, Disp: 30 g, Rfl: 4 .  amoxicillin-clavulanate (AUGMENTIN) 875-125 MG tablet, Take 1 tablet by mouth 2 (two) times daily., Disp: 14 tablet, Rfl: 0 .  benzonatate (TESSALON PERLES) 100 MG capsule, Take 1-2 capsules (100-200 mg total) by mouth 3 (three) times daily as needed for cough., Disp: 30 capsule, Rfl: 0 .  cetirizine (ZYRTEC) 10 MG tablet, Take by mouth., Disp: , Rfl:  .  doxycycline (VIBRAMYCIN) 100 MG capsule, Take 1 capsule (100 mg total) by mouth 2 (two) times daily., Disp: 20 capsule, Rfl: 0 .  levonorgestrel-ethinyl estradiol (AVIANE,ALESSE,LESSINA) 0.1-20 MG-MCG tablet, Take 1 tablet by mouth daily., Disp: , Rfl:  .  loratadine (CLARITIN) 10 MG tablet, Take 10 mg by mouth daily., Disp: , Rfl:  .  polyethylene glycol (MIRALAX) packet, 17 g daily as needed may reduce dose also (Patient not taking: Reported on 05/30/2017), Disp: 14 each, Rfl: 0 No current facility-administered medications for this visit.   Facility-Administered Medications Ordered in Other Visits:  .  gadopentetate dimeglumine (MAGNEVIST) injection 15 mL, 15 mL, Intravenous, Once PRN, Sater, Nanine Means, MD  PAST MEDICAL HISTORY: Past Medical History:  Diagnosis Date  . Anal fissure   . IBS (irritable bowel syndrome)   . Migraine headache   . Pelvic floor dysfunction?     PAST SURGICAL HISTORY: Past Surgical History:  Procedure Laterality Date  . EAR TUBE REMOVAL    . TONSILLECTOMY AND ADENOIDECTOMY    . TYMPANOPLASTY Right   . TYMPANOSTOMY TUBE PLACEMENT     x 3  . WISDOM TOOTH EXTRACTION      FAMILY HISTORY: Family History  Problem Relation Age of Onset  . Migraines Mother   . Hyperlipidemia Father   . Diverticulitis Father        had to have part of colon removed  . Asthma Brother   . Diabetes Maternal Grandmother     SOCIAL HISTORY:  Social History   Socioeconomic  History  . Marital status: Single    Spouse name: Not on file  . Number of children: 0  . Years of education: Not on file  . Highest education level: Not on file  Occupational History  . Occupation: Equities trader  Social Needs  . Financial resource strain: Not on file  . Food insecurity:    Worry: Not on file    Inability: Not on file  . Transportation needs:    Medical: Not on file    Non-medical: Not on file  Tobacco Use  . Smoking status: Never Smoker  . Smokeless tobacco: Never Used  Substance and Sexual Activity  . Alcohol use: Yes    Alcohol/week: 0.0 oz    Comment: occasional-2 per month  . Drug use: No  . Sexual activity: Not on file  Lifestyle  .  Physical activity:    Days per week: Not on file    Minutes per session: Not on file  . Stress: Not on file  Relationships  . Social connections:    Talks on phone: Not on file    Gets together: Not on file    Attends religious service: Not on file    Active member of club or organization: Not on file    Attends meetings of clubs or organizations: Not on file    Relationship status: Not on file  . Intimate partner violence:    Fear of current or ex partner: Not on file    Emotionally abused: Not on file    Physically abused: Not on file    Forced sexual activity: Not on file  Other Topics Concern  . Not on file  Social History Narrative   Single, no children    moved here from [place] went to [facility]. Moved here after her parents moved here.   Clinic nurse in [employer]     PHYSICAL EXAM  There were no vitals filed for this visit.  There is no height or weight on file to calculate BMI.   General: The patient is well-developed and well-nourished and in no acute distress  Musculoskeletal:   She has tenderness over the left trochanteric bursa in the left SI joint.  There is milder tenderness over the left piriformis muscle.  There is no tenderness over the  lumbar paraspinal muscles.   She has good range of motion in the hip  Neurologic Exam  Mental status: The patient is alert and oriented x 3 at the time of the examination.    Cranial nerves: Facial strength is normal.  No dysarthria.  Motor:  Muscle bulk is normal.   Tone is normal. Strength is  5 / 5 in the legs.   Sensory: Intact sensation to touch and vibration in the legs s.   Gait and station: Station is normal.  Gait is normal. Tandem gait is normal.   Reflexes: Deep tendon reflexes are symmetric and normal bilaterally.      DIAGNOSTIC DATA (LABS, IMAGING, TESTING) - I reviewed patient records, labs, notes, testing and imaging myself where available.    ASSESSMENT AND PLAN  Trochanteric bursitis of left hip  Sprain of sacroiliac ligament, initial encounter   1.   Left trochanteric bursa injection with 40 mg Depo-Medrol and 2.5 cc Marcaine using sterile technique.  She tolerated the procedure well.   2.    Left SI joint injection with 40 mg Depo-Medrol in 2.5 cc Marcaine using sterile technique.  She tolerated the procedure well. 3.   Continue prn NSAIDs. 4.    Exercise as tolerated. 5.    Return if new or worsening neurological or musculoskeletal issues.  Richard A. Felecia Shelling, MD, PhD 0/06/8880, 8:00 PM Certified in Neurology, Clinical Neurophysiology, Sleep Medicine, Pain Medicine and Neuroimaging  [facility] [address], [place],  [address] [phone]

## 2017-08-04 ENCOUNTER — Other Ambulatory Visit: Payer: Self-pay | Admitting: Neurology

## 2017-08-04 MED ORDER — METHYLPREDNISOLONE 4 MG PO TABS: ORAL_TABLET | ORAL | 0 refills | Status: DC

## 2017-08-05 DIAGNOSIS — Z87440 Personal history of urinary (tract) infections: Secondary | ICD-10-CM | POA: Diagnosis not present

## 2017-08-05 DIAGNOSIS — R350 Frequency of micturition: Secondary | ICD-10-CM | POA: Diagnosis not present

## 2017-08-11 ENCOUNTER — Other Ambulatory Visit: Payer: Self-pay | Admitting: Neurology

## 2017-08-11 ENCOUNTER — Ambulatory Visit
Admission: RE | Admit: 2017-08-11 | Discharge: 2017-08-11 | Disposition: A | Discharge status: Home / Self Care | Payer: 59 | Source: Ambulatory Visit | Attending: Neurology | Admitting: Neurology

## 2017-08-11 ENCOUNTER — Telehealth: Payer: Self-pay | Admitting: Neurology

## 2017-08-11 DIAGNOSIS — M7062 Trochanteric bursitis, left hip: Secondary | ICD-10-CM

## 2017-08-11 DIAGNOSIS — M25552 Pain in left hip: Secondary | ICD-10-CM | POA: Diagnosis not present

## 2017-08-11 DIAGNOSIS — S336XXA Sprain of sacroiliac joint, initial encounter: Secondary | ICD-10-CM

## 2017-08-11 DIAGNOSIS — M545 Low back pain, unspecified: Secondary | ICD-10-CM | POA: Insufficient documentation

## 2017-08-11 NOTE — Telephone Encounter (Signed)
She continues to experience pain in the sacral and left hip region --- two separate pains, one over SI joint and another lateral lower hip.    Injections did not help much and some increase in pain initially

## 2017-08-11 NOTE — Progress Notes (Signed)
[place] [facility] [address] [place], [place] [address] [phone]  Date:  08/14/2017   Name:  Annette Parks   DOB:  Nov 09, 1990   MRN:  [id number]  PCP:  Darreld Mclean, MD    Chief Complaint: Annual Exam (Pt here for CPE. Last PAP 07/12/16. )   History of Present Illness:  Annette Parks is a 27 y.o. very pleasant female patient who presents with the following:  CPE today  Pap: 3/19- she sees GYN, saw them a month ago.   Labs:  She had a CMP and CBC , she would like to have a lipid panel drawn. 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    otherwise she is feeling quite well and has no concerns No tobacco, rare alcohol She is not able to exercise much right now due to her hip   Patient Active Problem List   Diagnosis Date Noted  . Hip pain, chronic, left 08/13/2017  . Lower back pain 08/11/2017  . Trochanteric bursitis of left hip 07/31/2017  . Sacroiliac (ligament) sprain 07/31/2017  . Common migraine 05/07/2016  . Eye pain, bilateral 05/07/2016  . Facial pain 05/07/2016  . GAD (generalized anxiety disorder) 03/22/2015  . Neck pain 07/11/2014  . Myalgia 07/11/2014  . Dysesthesia 07/11/2014  . Urinary frequency 07/11/2014    Past Medical History:  Diagnosis Date  . Anal fissure   . IBS (irritable bowel syndrome)   . Migraine headache   . Pelvic floor dysfunction?     Past Surgical History:  Procedure Laterality Date  . EAR TUBE REMOVAL    . TONSILLECTOMY  AND ADENOIDECTOMY    . TYMPANOPLASTY Right   . TYMPANOSTOMY TUBE PLACEMENT     x 3  . WISDOM TOOTH EXTRACTION      Social History   Tobacco Use  . Smoking status: Never Smoker  . Smokeless tobacco: Never Used  Substance Use Topics  . Alcohol use: Yes    Alcohol/week: 0.0 oz    Comment: occasional-2 per month  . Drug use: No    Family History  Problem Relation Age of Onset  . Migraines Mother   . Hyperlipidemia Father   . Diverticulitis Father        had to have part of colon removed  . Asthma Brother   . Diabetes Maternal Grandmother     No Known Allergies  Medication list has been reviewed and updated.  Current Outpatient Medications on File Prior to Visit  Medication Sig Dispense Refill  . aspirin-acetaminophen-caffeine (EXCEDRIN MIGRAINE) 250-250-65 MG tablet Take by mouth every 6 (six) hours as needed for headache.    . cetirizine (ZYRTEC) 10 MG tablet Take by mouth.    . Melatonin 3 MG TABS Take 1 tablet by mouth at bedtime.     No current facility-administered medications on  file prior to visit.     Review of Systems:  As per HPI- otherwise negative. No fever or chills No CP or SOB    Physical Examination: Vitals:   08/14/17 1732  BP: 118/83  Pulse: 96  Temp: 98.3 F (36.8 C)  SpO2: 100%   Vitals:   08/14/17 1732  Weight: 129 lb 9.6 oz (58.8 kg)  Height: 5\' 5"  (1.651 m)   Body mass index is 21.57 kg/m. Ideal Body Weight: Weight in (lb) to have BMI = 25: 149.9  GEN: WDWN, NAD, Non-toxic, A & O x 3, slim build, looks well  HEENT: Atraumatic, Normocephalic. Neck supple. No masses, No LAD.  Bilateral TM wnl, oropharynx normal.  PEERL,EOMI.   Ears and Nose: No external deformity. CV: RRR, No M/G/R. No JVD. No thrill. No extra heart sounds. PULM: CTA B, no wheezes, crackles, rhonchi. No retractions. No resp. distress. No accessory muscle use. ABD: S, NT, ND EXTR: No c/c/e NEURO Normal gait.  Normal strength and DTR of all extremities  PSYCH:  Normally interactive. Conversant. Not depressed or anxious appearing.  Calm demeanor.    Assessment and Plan: Physical exam  Screening for hyperlipidemia - Plan: Lipid panel  Immunization due - Plan: Tdap vaccine greater than or equal to 7yo IM  CPE today Ordered a lipid panel for her  Healthy living and exercise discussed  tdap given today  She is not on contraception but is not currently SA   Signed Lamar Blinks, MD

## 2017-08-13 ENCOUNTER — Telehealth: Payer: Self-pay | Admitting: Neurology

## 2017-08-13 ENCOUNTER — Other Ambulatory Visit: Payer: Self-pay | Admitting: Neurology

## 2017-08-13 DIAGNOSIS — M25552 Pain in left hip: Principal | ICD-10-CM

## 2017-08-13 DIAGNOSIS — G8929 Other chronic pain: Secondary | ICD-10-CM

## 2017-08-13 NOTE — Telephone Encounter (Signed)
[facility] order sent to GI, they will contact the pt to schedule. No auth.

## 2017-08-13 NOTE — Progress Notes (Signed)
Left hip pain did not improve after trochanteric bursa injection.

## 2017-08-14 ENCOUNTER — Encounter: Payer: Self-pay | Admitting: Family Medicine

## 2017-08-14 ENCOUNTER — Ambulatory Visit: Payer: 59 | Admitting: Family Medicine

## 2017-08-14 VITALS — BP 118/83 | HR 96 | Temp 98.3°F | Ht 65.0 in | Wt 129.6 lb

## 2017-08-14 DIAGNOSIS — Z0000 Encounter for general adult medical examination without abnormal findings: Secondary | ICD-10-CM | POA: Diagnosis not present

## 2017-08-14 DIAGNOSIS — Z23 Encounter for immunization: Secondary | ICD-10-CM | POA: Diagnosis not present

## 2017-08-14 DIAGNOSIS — Z13220 Encounter for screening for lipoid disorders: Secondary | ICD-10-CM

## 2017-08-14 NOTE — Patient Instructions (Signed)
It was a pleasure to see you today as always!  Take care and I will look for your cholesterol report You got your Tdap vaccine today

## 2017-08-22 ENCOUNTER — Other Ambulatory Visit: Payer: Self-pay

## 2017-09-01 ENCOUNTER — Ambulatory Visit (INDEPENDENT_AMBULATORY_CARE_PROVIDER_SITE_OTHER): Payer: 59

## 2017-09-01 DIAGNOSIS — G8929 Other chronic pain: Secondary | ICD-10-CM

## 2017-09-01 DIAGNOSIS — M25552 Pain in left hip: Secondary | ICD-10-CM | POA: Diagnosis not present

## 2017-09-01 MED ORDER — GADOBENATE DIMEGLUMINE 529 MG/ML IV SOLN
11.0000 mL | Freq: Once | INTRAVENOUS | Status: AC | PRN
  Administered 2017-09-01: 11 mL via INTRAVENOUS

## 2017-09-02 ENCOUNTER — Other Ambulatory Visit: Payer: Self-pay | Admitting: Neurology

## 2017-09-02 DIAGNOSIS — M25552 Pain in left hip: Principal | ICD-10-CM

## 2017-09-02 DIAGNOSIS — G8929 Other chronic pain: Secondary | ICD-10-CM

## 2017-09-05 ENCOUNTER — Other Ambulatory Visit (INDEPENDENT_AMBULATORY_CARE_PROVIDER_SITE_OTHER): Payer: 59

## 2017-09-05 DIAGNOSIS — Z13220 Encounter for screening for lipoid disorders: Secondary | ICD-10-CM | POA: Diagnosis not present

## 2017-09-05 LAB — LIPID PANEL
CHOL/HDL RATIO: 2.7 (calc) (ref <5.0)
CHOLESTEROL: 181 mg/dL (ref <200)
HDL: 66 mg/dL (ref >50)
LDL Cholesterol (Calc): 98 mg/dL (calc)
NON-HDL CHOLESTEROL (CALC): 115 mg/dL (ref <130)
Triglycerides: 82 mg/dL (ref <150)

## 2017-09-05 NOTE — Addendum Note (Signed)
Addended by: Caffie Pinto on: 09/05/2017 02:41 PM   Modules accepted: Orders

## 2017-09-06 ENCOUNTER — Encounter: Payer: Self-pay | Admitting: Family Medicine

## 2017-09-12 ENCOUNTER — Other Ambulatory Visit (INDEPENDENT_AMBULATORY_CARE_PROVIDER_SITE_OTHER): Payer: Self-pay | Admitting: Radiology

## 2017-09-12 ENCOUNTER — Ambulatory Visit (INDEPENDENT_AMBULATORY_CARE_PROVIDER_SITE_OTHER): Payer: 59 | Admitting: Orthopaedic Surgery

## 2017-09-12 ENCOUNTER — Encounter (INDEPENDENT_AMBULATORY_CARE_PROVIDER_SITE_OTHER): Payer: Self-pay | Admitting: Orthopaedic Surgery

## 2017-09-12 ENCOUNTER — Ambulatory Visit (INDEPENDENT_AMBULATORY_CARE_PROVIDER_SITE_OTHER): Payer: 59

## 2017-09-12 VITALS — BP 110/76 | HR 95 | Resp 16 | Ht 65.0 in | Wt 130.0 lb

## 2017-09-12 DIAGNOSIS — M5442 Lumbago with sciatica, left side: Secondary | ICD-10-CM

## 2017-09-12 DIAGNOSIS — G8929 Other chronic pain: Secondary | ICD-10-CM

## 2017-09-12 DIAGNOSIS — M545 Low back pain: Principal | ICD-10-CM

## 2017-09-12 DIAGNOSIS — M25552 Pain in left hip: Secondary | ICD-10-CM

## 2017-09-12 NOTE — Progress Notes (Signed)
Office Visit Note   Patient: Annette Parks           Date of Birth: 1990-10-09           MRN: [id number] Visit Date: 09/12/2017              Requested by: Britt Bottom, MD [address] [place], [place] [address] PCP: Darreld Mclean, MD   Assessment & Plan: Visit Diagnoses:  1. Chronic midline low back pain with left-sided sciatica   2. Pain of left hip joint     Plan: Chronic recurrent pain lateral aspect left hip and lumbosacral junction.  Seems to be activity related.  MRI scan of pelvis negative.  Will obtain MRI of lumbar spine.  Return after study I think there is a possibility that a lot of her pain is referred from a lumbosacral spine Follow-Up Instructions: No follow-ups on file.   Orders:  Orders Placed This Encounter  Procedures  . XR Lumbar Spine 2-3 Views   No orders of the defined types were placed in this encounter.     Procedures: No procedures performed   Clinical Data: No additional findings.   Subjective: Chief Complaint  Patient presents with  . Lower Back - Pain  . Left Hip - Pain  . New Patient (Initial Visit)    PT IS A RUNNER. HAS HAD PAIN FOR 5 YRS OFF AND ON. 07/28/17 WOKE UP WITH LOW BACK PAIN, LEFT HIP PAIN AND HAD INJECTIONS 07/31/17 MADE SYMPTOMS WORSE.   27 year old female with long history of recurrent pain lateral aspect left hip.  Also has experienced recurrent pain at the lumbosacral junction.  No pain distal to her hip.  No right-sided symptoms.  Seen by Dr. Felecia Shelling 6 weeks ago with an injection over the left sacroiliac joint and left greater trochanter without much relief of her pain.  MRI scan of her pelvis was negative for any pathology about the soft tissue or the bony structures.  Has history of chronic recurrent migraines.  Is an active runner and finds that she has pain mostly in her back and lateral aspect of her left hip certain distance.  No numbness or tingling.  No skin changes.  I have a copy of the MRI scan this which I  reviewed.  Also films of her pelvis I reviewed on the PACS system without any obvious abnormality about the left hip or left hemipelvis. Has tried Tylenol with some relief.  Cortisone injection 6 weeks ago did not alleviate her pain.  No related bowel or bladder dysfunction.  HPI  Review of Systems   Objective: Vital Signs: BP 110/76 (BP Location: Right Arm, Patient Position: Sitting, Cuff Size: Normal)   Pulse 95   Resp 16   Ht 5\' 5"  (1.651 m)   Wt 130 lb (59 kg)   LMP 08/27/2017   BMI 21.63 kg/m   Physical Exam  Constitutional: She is oriented to person, place, and time. She appears well-developed and well-nourished.  HENT:  Mouth/Throat: Oropharynx is clear and moist.  Eyes: Pupils are equal, round, and reactive to light. EOM are normal.  Pulmonary/Chest: Effort normal.  Neurological: She is alert and oriented to person, place, and time.  Skin: Skin is warm and dry.  Psychiatric: She has a normal mood and affect. Her behavior is normal.    Ortho Exam awake alert and oriented x3 comfortable sitting.  Some mild discomfort over the greater trochanter of her left hip.  No pain  with internal/external rotation.  No pain with hyperflexion of her right hip.  No distal edema.  Neurovascular exam intact.  Discomfort at the lumbosacral junction midline.  No pain over the sacroiliac joints.  Oxygen tips of her fingers to the tips of her toes. Specialty Comments:  No specialty comments available.  Imaging: No results found.   PMFS History: Patient Active Problem List   Diagnosis Date Noted  . Hip pain, chronic, left 08/13/2017  . Lower back pain 08/11/2017  . Trochanteric bursitis of left hip 07/31/2017  . Sacroiliac (ligament) sprain 07/31/2017  . Common migraine 05/07/2016  . Eye pain, bilateral 05/07/2016  . Facial pain 05/07/2016  . GAD (generalized anxiety disorder) 03/22/2015  . Neck pain 07/11/2014  . Myalgia 07/11/2014  . Dysesthesia 07/11/2014  . Urinary frequency  07/11/2014   Past Medical History:  Diagnosis Date  . Anal fissure   . IBS (irritable bowel syndrome)   . Migraine headache   . Pelvic floor dysfunction?     Family History  Problem Relation Age of Onset  . Migraines Mother   . Hyperlipidemia Father   . Diverticulitis Father        had to have part of colon removed  . Asthma Brother   . Diabetes Maternal Grandmother     Past Surgical History:  Procedure Laterality Date  . EAR TUBE REMOVAL    . TONSILLECTOMY AND ADENOIDECTOMY    . TYMPANOPLASTY Right   . TYMPANOSTOMY TUBE PLACEMENT     x 3  . WISDOM TOOTH EXTRACTION     Social History   Occupational History  . Occupation: Equities trader  Tobacco Use  . Smoking status: Never Smoker  . Smokeless tobacco: Never Used  Substance and Sexual Activity  . Alcohol use: Yes    Alcohol/week: 0.0 oz    Comment: occasional-2 per month  . Drug use: No  . Sexual activity: Not on file     Garald Balding, MD   Note - This record has been created using [employer].  Chart creation errors have been sought, but may not always  have been located. Such creation errors do not reflect on  the standard of medical care.

## 2017-09-16 ENCOUNTER — Encounter (INDEPENDENT_AMBULATORY_CARE_PROVIDER_SITE_OTHER): Payer: Self-pay | Admitting: Orthopaedic Surgery

## 2017-09-21 ENCOUNTER — Telehealth: Payer: 59 | Admitting: Family

## 2017-09-21 DIAGNOSIS — H60331 Swimmer's ear, right ear: Secondary | ICD-10-CM | POA: Diagnosis not present

## 2017-09-21 MED ORDER — CIPROFLOXACIN-DEXAMETHASONE 0.3-0.1 % OT SUSP: 4.0000 [drp] | Freq: Two times a day (BID) | OTIC | 0 refills | Status: DC

## 2017-09-21 NOTE — Progress Notes (Signed)
E Visit for Swimmer's Ear  We are sorry that you are not feeling well. Here is how we plan to help!  Based on what you have shared with me it looks like you have swimmers ear. Swimmer's ear is a redness or swelling, irritation, or infection of your outer ear canal.  These symptoms usually occur within a few days of swimming.  Your ear canal is a tube that goes from the opening of the ear to the eardrum.  When water stays in your ear canal, germs can grow.  This is a painful condition that often happens to children and swimmers of all ages.  It is not contagious and oral antibiotics are not required to treat uncomplicated swimmer's ear.  The usual symptoms include: Itching inside the ear, Redness or a sense of swelling in the ear, Pain when the ear is tugged on when pressure is placed on the ear, Pus draining from the infected ear. and I have prescribed: Ciprofloxin 0.2% and hydrocortisone 1% otic suspension 3 drops in affected ears twice daily until completed    In certain cases swimmer's ear may progress to a more serious bacterial infection of the middle or inner ear.  If you have a fever 102 and up and significantly worsening symptoms, this could indicate a more serious infection moving to the middle/inner and needs face to face evaluation in an office by a provider.  Your symptoms should improve over the next 3 days and should resolve in about 7 days.  HOME CARE:   Wash your hands frequently.  Do not place the tip of the bottle on your ear or touch it with your fingers.  You can take Acetominophen 650 mg every 4-6 hours as needed for pain.  If pain is severe or moderate, you can apply a heating pad (set on low) or hot water bottle (wrapped in a towel) to outer ear for 20 minutes.  This will also increase drainage.  Avoid ear plugs  Do not use Q-tips  After showers, help the water run out by tilting your head to one side.  GET HELP RIGHT AWAY IF:   Fever is over 102.2 degrees.  You  develop progressive ear pain or hearing loss.  Ear symptoms persist longer than 3 days after treatment.  MAKE SURE YOU:   Understand these instructions.  Will watch your condition.  Will get help right away if you are not doing well or get worse.  TO PREVENT SWIMMER'S EAR:  Use a bathing cap or custom fitted swim molds to keep your ears dry.  Towel off after swimming to dry your ears.  Tilt your head or pull your earlobes to allow the water to escape your ear canal.  If there is still water in your ears, consider using a hairdryer on the lowest setting.  Thank you for choosing an e-visit. Your e-visit answers were reviewed by a board certified advanced clinical practitioner to complete your personal care plan. Depending upon the condition, your plan could have included both over the counter or prescription medications. Please review your pharmacy choice. Be sure that the pharmacy you have chosen is open so that you can pick up your prescription now.  If there is a problem you may message your provider in MyChart to have the prescription routed to another pharmacy. Your safety is important to us. If you have drug allergies check your prescription carefully.  For the next 24 hours, you can use MyChart to ask questions about today's   visit, request a non-urgent call back, or ask for a work or school excuse from your e-visit provider. You will get an email in the next two days asking about your experience. I hope that your e-visit has been valuable and will speed your recovery.       

## 2017-09-23 ENCOUNTER — Ambulatory Visit (INDEPENDENT_AMBULATORY_CARE_PROVIDER_SITE_OTHER): Payer: 59

## 2017-09-23 DIAGNOSIS — M545 Low back pain: Secondary | ICD-10-CM | POA: Diagnosis not present

## 2017-09-23 DIAGNOSIS — G8929 Other chronic pain: Secondary | ICD-10-CM

## 2017-09-26 ENCOUNTER — Ambulatory Visit: Payer: 59 | Admitting: Internal Medicine

## 2017-09-26 DIAGNOSIS — H7291 Unspecified perforation of tympanic membrane, right ear: Secondary | ICD-10-CM | POA: Diagnosis not present

## 2017-09-26 DIAGNOSIS — H9011 Conductive hearing loss, unilateral, right ear, with unrestricted hearing on the contralateral side: Secondary | ICD-10-CM | POA: Insufficient documentation

## 2017-09-29 ENCOUNTER — Encounter (INDEPENDENT_AMBULATORY_CARE_PROVIDER_SITE_OTHER): Payer: Self-pay | Admitting: Orthopaedic Surgery

## 2017-09-29 ENCOUNTER — Ambulatory Visit (INDEPENDENT_AMBULATORY_CARE_PROVIDER_SITE_OTHER): Payer: 59 | Admitting: Orthopaedic Surgery

## 2017-09-29 VITALS — BP 105/80 | HR 95 | Ht 65.0 in | Wt 130.0 lb

## 2017-09-29 DIAGNOSIS — G8929 Other chronic pain: Secondary | ICD-10-CM | POA: Diagnosis not present

## 2017-09-29 DIAGNOSIS — M25552 Pain in left hip: Secondary | ICD-10-CM | POA: Diagnosis not present

## 2017-09-29 NOTE — Progress Notes (Signed)
Office Visit Note   Patient: Annette Parks           Date of Birth: 24-Sep-1990           MRN: [id number] Visit Date: 09/29/2017              Requested by: Annette Mclean, MD [address] [place], [place] [address] PCP: Annette Mclean, MD   Assessment & Plan: Visit Diagnoses:  1. Hip pain, chronic, left     Plan: MRI of lumbar spine was negative.  MRI scan of pelvis was also negative and specifically the left hemipelvis where she is been experiencing her pain.  Long discussion regarding activity modification including running only to the point of comfort and crosstraining with bicycle riding swimming and working with machines I would suggest 6 3 weeks of that activity before returning to running and seeing if it makes a difference.  No obvious etiology by any of the scans.  I am frustrated for her is all of her studies have been negative.  Hopefully by altering activities for a while make a difference.  May be Annette Parks could look at her in a different light given all the negative studies  Follow-Up Instructions: Return if symptoms worsen or fail to improve.   Orders:  No orders of the defined types were placed in this encounter.  No orders of the defined types were placed in this encounter.     Procedures: No procedures performed   Clinical Data: No additional findings.   Subjective: Chief Complaint  Patient presents with  . Lower Back - Follow-up  . Follow-up    MRI REVIEW L SPINE  Annette Parks has had an issue with recurrent pain along her left hip since 2014.  She has had a recent exacerbation that has compromised her activities and specifically running.  Pain starts in the left parasacral region radiates to the left trochanter and then stops at about her mid thigh.  She has not had any numbness or tingling.  She does not have any bowel or bladder dysfunction.  She is had an MRI scan with contrast of her left pelvis and hip without any obvious  abnormality.  She is now had an MRI scan of her lumbar spine that was also negative.  In 2016 she had an MRI scan of her cervical spine was also negative.  She is had a number of lab studies performed recently including thyroid which were also normal.  She notes at one time that she had slightly elevated sed rate but no specific etiology.  He obviously is frustrated with her inability to run as she really enjoys that activity. Annette Parks has injected the greater trochanter of her left hip without much relief thinking she may have a bursitis.  He also has injected the area of the sacroiliac joint on the left without much relief. No related bowel or bladder dysfunction.  HPI  Review of Systems  Constitutional: Negative for fatigue and fever.  HENT: Negative for ear pain.   Eyes: Negative for pain.  Respiratory: Negative for cough and shortness of breath.   Cardiovascular: Negative for leg swelling.  Gastrointestinal: Positive for constipation. Negative for diarrhea.  Genitourinary: Negative for difficulty urinating.  Musculoskeletal: Positive for back pain. Negative for neck pain.  Skin: Negative for rash.  Allergic/Immunologic: Negative for food allergies.  Hematological: Does not bruise/bleed easily.  Psychiatric/Behavioral: Negative for sleep disturbance.     Objective: Vital  Signs: BP 105/80 (BP Location: Left Arm, Patient Position: Sitting, Cuff Size: Normal)   Pulse 95   Ht 5\' 5"  (1.651 m)   Wt 130 lb (59 kg)   BMI 21.63 kg/m   Physical Exam  Constitutional: She is oriented to person, place, and time. She appears well-developed and well-nourished.  HENT:  Mouth/Throat: Oropharynx is clear and moist.  Eyes: Pupils are equal, round, and reactive to light. EOM are normal.  Pulmonary/Chest: Effort normal.  Neurological: She is alert and oriented to person, place, and time.  Skin: Skin is warm and dry.  Psychiatric: She has a normal mood and affect. Her behavior is normal.     Ortho Exam awake alert and oriented x3.  Comfortable sitting.  Is without a limp.  Reflexes are symmetrical.  Leg lengths appear to be symmetrical.  No swelling.  Neurovascular exam intact no percussible tenderness lumbar spine or sacroiliac joints.  No localized tenderness over either hip.  Painless range of motion with internal/external rotation of both hips even in hyperflexion.  With figure-of-four testing was some discomfort along the lateral aspect of her left hip with a figure 4 testing on the right.  Not sure if how significant that might be. Specialty Comments:  No specialty comments available.  Imaging: No results found.   PMFS History: Patient Active Problem List   Diagnosis Date Noted  . Hip pain, chronic, left 08/13/2017  . Lower back pain 08/11/2017  . Trochanteric bursitis of left hip 07/31/2017  . Sacroiliac (ligament) sprain 07/31/2017  . Common migraine 05/07/2016  . Eye pain, bilateral 05/07/2016  . Facial pain 05/07/2016  . GAD (generalized anxiety disorder) 03/22/2015  . Neck pain 07/11/2014  . Myalgia 07/11/2014  . Dysesthesia 07/11/2014  . Urinary frequency 07/11/2014   Past Medical History:  Diagnosis Date  . Anal fissure   . IBS (irritable bowel syndrome)   . Migraine headache   . Pelvic floor dysfunction?   . Perforated eardrum     Family History  Problem Relation Age of Onset  . Migraines Mother   . Hyperlipidemia Father   . Diverticulitis Father        had to have part of colon removed  . Asthma Brother   . Diabetes Maternal Grandmother     Past Surgical History:  Procedure Laterality Date  . EAR TUBE REMOVAL    . TONSILLECTOMY AND ADENOIDECTOMY    . TYMPANOPLASTY Right   . TYMPANOSTOMY TUBE PLACEMENT     x 3  . WISDOM TOOTH EXTRACTION     Social History   Occupational History  . Occupation: Equities trader  Tobacco Use  . Smoking status: Never Smoker  . Smokeless tobacco: Never Used  Substance and Sexual Activity  . Alcohol  use: Yes    Alcohol/week: 0.0 oz    Comment: occasional-2 per month  . Drug use: No  . Sexual activity: Not on file

## 2017-10-10 DIAGNOSIS — H9011 Conductive hearing loss, unilateral, right ear, with unrestricted hearing on the contralateral side: Secondary | ICD-10-CM | POA: Diagnosis not present

## 2017-10-10 DIAGNOSIS — H7291 Unspecified perforation of tympanic membrane, right ear: Secondary | ICD-10-CM | POA: Diagnosis not present

## 2017-12-24 NOTE — Progress Notes (Signed)
[facility] [place], [place], [place] [address] [phone]  Date:  12/25/2017   Name:  Annette Parks   DOB:  December 02, 1990   MRN:  [id number]  PCP:  Darreld Mclean, MD    Chief Complaint: Anxiety (has been going though alot latley, loss of friend)   History of Present Illness:  Annette Parks is a 27 y.o. 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  However she feels like her parents shamed her somewhat for needing to seek help and still she feels like people who admit to anxiety or depression may be pegged as "crazy" by others   She notes that she worried a lot, "about everything"  Her sx are affecting her job and her social relationships She is not sure if there is some depression as well  She is feeling anxious "all the time"  She is not sleeping well- she is using melatonin She may wake up a couple of times during the night but is able to get back to sleep She tends to feel tired, even after a good nights sleep she may still feel tired  She does not have anhedonia  No SI Never did any counseling as an adult Has not tried any medications for this   Notes that her parents still have a lot of influence/ control over her, more than she feels is normal for a woman of her age.  She would be interested in doing some counseling to help work through these issues  Wt  Readings from Last 3 Encounters:  12/25/17 133 lb (60.3 kg)  09/29/17 130 lb (59 kg)  09/12/17 130 lb (59 kg)     Patient Active Problem List   Diagnosis Date Noted  . Hip pain, chronic, left 08/13/2017  . Lower back pain 08/11/2017  . Trochanteric bursitis of left hip 07/31/2017  . Sacroiliac (ligament) sprain 07/31/2017  . Common migraine 05/07/2016  . Eye pain, bilateral 05/07/2016  . Facial pain 05/07/2016  . GAD (generalized anxiety disorder) 03/22/2015  . Neck pain 07/11/2014  . Myalgia 07/11/2014  . Dysesthesia 07/11/2014    Past Medical History:  Diagnosis Date  . Anal fissure   . IBS (irritable bowel syndrome)   . Migraine headache   . Pelvic floor dysfunction?   . Perforated eardrum     Past Surgical History:  Procedure Laterality Date  . EAR TUBE REMOVAL    . TONSILLECTOMY AND ADENOIDECTOMY    . TYMPANOPLASTY Right   . TYMPANOSTOMY TUBE PLACEMENT     x 3  . WISDOM TOOTH EXTRACTION      Social History   Tobacco Use  . Smoking status: Never Smoker  . Smokeless tobacco: Never Used  Substance Use Topics  . Alcohol use: Yes  Alcohol/week: 0.0 standard drinks    Comment: occasional-2 per month  . Drug use: No    Family History  Problem Relation Age of Onset  . Migraines Mother   . Hyperlipidemia Father   . Diverticulitis Father        had to have part of colon removed  . Asthma Brother   . Diabetes Maternal Grandmother     No Known Allergies  Medication list has been reviewed and updated.  Current Outpatient Medications on File Prior to Visit  Medication Sig Dispense Refill  . cetirizine (ZYRTEC) 10 MG tablet Take by mouth.    . Melatonin 3 MG TABS Take 1 tablet by mouth at bedtime.    . Multiple Vitamins-Minerals (MULTIVITAMIN ADULT PO)     . vitamin B-12 (CYANOCOBALAMIN) 1000 MCG tablet Take 1,000 mcg by mouth daily.     No current facility-administered medications on file prior to visit.     Review of Systems:  As per HPI-  otherwise negative. Lab Results  Component Value Date   TSH 1.73 08/05/2016     Physical Examination: Vitals:   12/25/17 1732  BP: 110/78  Pulse: 87  Resp: 16  Temp: 98.2 F (36.8 C)  SpO2: 98%   Vitals:   12/25/17 1732  Weight: 133 lb (60.3 kg)  Height: 5\' 5"  (1.651 m)   Body mass index is 22.13 kg/m. Ideal Body Weight: Weight in (lb) to have BMI = 25: 149.9  GEN: WDWN, NAD, Non-toxic, A & O x 3, looks well but is tearful during interview today  HEENT: Atraumatic, Normocephalic. Neck supple. No masses, No LAD. Ears and Nose: No external deformity. CV: RRR, No M/G/R. No JVD. No thrill. No extra heart sounds. PULM: CTA B, no wheezes, crackles, rhonchi. No retractions. No resp. distress. No accessory muscle use. ABD: S, NT, ND EXTR: No c/c/e NEURO Normal gait.  PSYCH: Normally interactive. Conversant. Not depressed or anxious appearing.  Calm demeanor.    Assessment and Plan: Adjustment reaction with anxiety - Plan: FLUoxetine (PROZAC) 20 MG tablet, FLUoxetine (PROZAC) 20 MG tablet  After discussion Annette Parks would like to try some mediation for anxiety.  Depression is less of an issue but would like to potentially treat this as well rx for prozac 20 mg, may increase to 40 mg after 2 weeks Also went over black box warning and counseling options for her  See patient instructions for more details.     Signed Lamar Blinks, MD

## 2017-12-25 ENCOUNTER — Encounter: Payer: Self-pay | Admitting: Family Medicine

## 2017-12-25 ENCOUNTER — Ambulatory Visit: Payer: 59 | Admitting: Family Medicine

## 2017-12-25 VITALS — BP 110/78 | HR 87 | Temp 98.2°F | Resp 16 | Ht 65.0 in | Wt 133.0 lb

## 2017-12-25 DIAGNOSIS — F4322 Adjustment disorder with anxiety: Secondary | ICD-10-CM

## 2017-12-25 MED ORDER — FLUOXETINE HCL 20 MG PO TABS: 20.0000 mg | ORAL_TABLET | Freq: Every day | ORAL | 3 refills | Status: DC

## 2017-12-25 NOTE — Patient Instructions (Signed)
It was good to see you today- I am glad that you came in!  Let's have you try fluoxetine 20 mg- take 1 pill for 2 weeks, then increase to 2 pills  Please see me in 4-6 weeks to check on how you are doing. I hope that the medication will help to ease your anxiety  If you are not doing ok please contact me  I does seem like counseling to work through some of your parent issues may also be helpful for you, if you wish to pursue this

## 2017-12-26 ENCOUNTER — Other Ambulatory Visit: Payer: Self-pay

## 2017-12-26 DIAGNOSIS — F4322 Adjustment disorder with anxiety: Secondary | ICD-10-CM

## 2017-12-26 MED ORDER — FLUOXETINE HCL 20 MG PO TABS: 20.0000 mg | ORAL_TABLET | Freq: Every day | ORAL | 3 refills | Status: DC

## 2017-12-26 MED FILL — FLUoxetine HCL 20 MG TABS: 20 | 30 days supply | Qty: 60 | Fill #0

## 2018-01-21 ENCOUNTER — Encounter: Payer: Self-pay | Admitting: Family Medicine

## 2018-01-21 NOTE — Telephone Encounter (Signed)
I have canceled patient's follow up per her request.

## 2018-01-29 ENCOUNTER — Ambulatory Visit: Payer: 59 | Admitting: Family Medicine

## 2018-03-20 MED FILL — LEVONOR-ETH ESTRAD 0.1-0.02: 0.1-20 | 84 days supply | Qty: 84 | Fill #0

## 2018-04-14 DIAGNOSIS — N6019 Diffuse cystic mastopathy of unspecified breast: Secondary | ICD-10-CM | POA: Diagnosis not present

## 2018-04-14 DIAGNOSIS — Z3041 Encounter for surveillance of contraceptive pills: Secondary | ICD-10-CM | POA: Diagnosis not present

## 2018-04-14 DIAGNOSIS — N63 Unspecified lump in unspecified breast: Secondary | ICD-10-CM | POA: Diagnosis not present

## 2018-04-14 DIAGNOSIS — N644 Mastodynia: Secondary | ICD-10-CM | POA: Diagnosis not present

## 2018-04-15 ENCOUNTER — Other Ambulatory Visit: Payer: Self-pay | Admitting: Obstetrics and Gynecology

## 2018-04-15 DIAGNOSIS — N644 Mastodynia: Secondary | ICD-10-CM

## 2018-04-28 ENCOUNTER — Ambulatory Visit
Admission: RE | Admit: 2018-04-28 | Discharge: 2018-04-28 | Disposition: A | Discharge status: Home / Self Care | Payer: 59 | Source: Ambulatory Visit | Attending: Obstetrics and Gynecology | Admitting: Obstetrics and Gynecology

## 2018-04-28 DIAGNOSIS — N644 Mastodynia: Secondary | ICD-10-CM

## 2018-04-28 DIAGNOSIS — N6489 Other specified disorders of breast: Secondary | ICD-10-CM | POA: Diagnosis not present

## 2018-06-15 MED FILL — LEVONOR-ETH ESTRAD 0.1-0.02: 0.1-20 | 84 days supply | Qty: 84 | Fill #1

## 2018-07-07 DIAGNOSIS — H7291 Unspecified perforation of tympanic membrane, right ear: Secondary | ICD-10-CM | POA: Diagnosis not present

## 2018-07-07 DIAGNOSIS — H9011 Conductive hearing loss, unilateral, right ear, with unrestricted hearing on the contralateral side: Secondary | ICD-10-CM | POA: Diagnosis not present

## 2018-07-13 DIAGNOSIS — H9011 Conductive hearing loss, unilateral, right ear, with unrestricted hearing on the contralateral side: Secondary | ICD-10-CM | POA: Diagnosis not present

## 2018-07-20 ENCOUNTER — Telehealth: Payer: 59 | Admitting: Physician Assistant

## 2018-07-20 DIAGNOSIS — R05 Cough: Secondary | ICD-10-CM

## 2018-07-20 DIAGNOSIS — R059 Cough, unspecified: Secondary | ICD-10-CM

## 2018-07-20 DIAGNOSIS — R0602 Shortness of breath: Secondary | ICD-10-CM

## 2018-07-20 MED ORDER — ALBUTEROL SULFATE HFA 108 (90 BASE) MCG/ACT IN AERS: 2.0000 | INHALATION_SPRAY | RESPIRATORY_TRACT | 0 refills | Status: DC | PRN

## 2018-07-20 MED ORDER — BENZONATATE 100 MG PO CAPS: 100.0000 mg | ORAL_CAPSULE | Freq: Three times a day (TID) | ORAL | 0 refills | Status: DC | PRN

## 2018-07-20 NOTE — Progress Notes (Signed)
E-Visit for Corona Virus Screening  Based on your current symptoms, you may very well have the virus, however your symptoms are mild. Currently, not all patients are being tested. If the symptoms are mild and there is not a known exposure, performing the test is not indicated.  Coronavirus disease 2019 (COVID-19) is a respiratory illness that can spread from person to person. The virus that causes COVID-19 is a new virus that was first identified in the country of [place] but is now found in multiple other countries and has spread to the [place].  Symptoms associated with the virus are mild to severe fever, cough, and shortness of breath. There is currently no vaccine to protect against COVID-19, and there is no specific antiviral treatment for the virus.   To be considered HIGH RISK for Coronavirus (COVID-19), you have to meet the following criteria:  . Traveled to [place], [place], [place], [place] or [place]; or in the [place] to [place], [place], [place], or [place]; and have fever, cough, and shortness of breath within the last 2 weeks of travel OR  . Been in close contact with a person diagnosed with COVID-19 within the last 2 weeks and have fever, cough, and shortness of breath  . IF YOU DO NOT MEET THESE CRITERIA, YOU ARE CONSIDERED LOW RISK FOR COVID-19.   It is vitally important that if you feel that you have an infection such as this virus or any other virus that you stay home and away from places where you may spread it to others.  You should self-quarantine for 14 days if you have symptoms that could potentially be coronavirus and avoid contact with people age 83 and older.   You can use medication such as A prescription cough medication called Tessalon Perles 100 mg. You may take 1-2 capsules every 8 hours as needed for cough and A prescription inhaler called Albuterol MDI 90 mcg /actuation 2 puffs every 4 hours as needed for shortness of breath, wheezing, cough  You may  also take acetaminophen (Tylenol) as needed for fever.   Reduce your risk of any infection by using the same precautions used for avoiding the common cold or flu:  Marland Kitchen Wash your hands often with soap and warm water for at least 20 seconds.  If soap and water are not readily available, use an alcohol-based hand sanitizer with at least 60% alcohol.  . If coughing or sneezing, cover your mouth and nose by coughing or sneezing into the elbow areas of your shirt or coat, into a tissue or into your sleeve (not your hands). . Avoid shaking hands with others and consider head nods or verbal greetings only. . Avoid touching your eyes, nose, or mouth with unwashed hands.  . Avoid close contact with people who are sick. . Avoid places or events with large numbers of people in one location, like concerts or sporting events. . Carefully consider travel plans you have or are making. . If you are planning any travel outside or inside the [place], visit the CDC's Travelers' Health webpage for the latest health notices. . If you have some symptoms but not all symptoms, continue to monitor at home and seek medical attention if your symptoms worsen. . If you are having a medical emergency, call 911.  HOME CARE . Only take medications as instructed by your medical team. . Drink plenty of fluids and get plenty of rest. . A steam or ultrasonic humidifier can help if you  have congestion.   GET HELP RIGHT AWAY IF: . You develop worsening fever. . You become short of breath . You cough up blood. . Your symptoms become more severe MAKE SURE YOU   Understand these instructions.  Will watch your condition.  Will get help right away if you are not doing well or get worse.  Your e-visit answers were reviewed by a board certified advanced clinical practitioner to complete your personal care plan.  Depending on the condition, your plan could have included both over the counter or prescription medications.  If there is a  problem please reply once you have received a response from your provider. Your safety is important to [place].  If you have drug allergies check your prescription carefully.    You can use MyChart to ask questions about today's visit, request a non-urgent call back, or ask for a work or school excuse for 24 hours related to this e-Visit. If it has been greater than 24 hours you will need to follow up with your provider, or enter a new e-Visit to address those concerns. You will get an e-mail in the next two days asking about your experience.  I hope that your e-visit has been valuable and will speed your recovery. Thank you for using e-visits.   I have spent 7 min in completion and review of this note- Lacy Duverney [facility]

## 2018-08-19 ENCOUNTER — Encounter: Payer: Self-pay | Admitting: Family Medicine

## 2018-08-19 ENCOUNTER — Ambulatory Visit (INDEPENDENT_AMBULATORY_CARE_PROVIDER_SITE_OTHER): Payer: 59 | Admitting: Family Medicine

## 2018-08-19 ENCOUNTER — Encounter: Payer: 59 | Admitting: Family Medicine

## 2018-08-19 ENCOUNTER — Other Ambulatory Visit: Payer: Self-pay

## 2018-08-19 DIAGNOSIS — Z8349 Family history of other endocrine, nutritional and metabolic diseases: Secondary | ICD-10-CM

## 2018-08-19 DIAGNOSIS — E559 Vitamin D deficiency, unspecified: Secondary | ICD-10-CM

## 2018-08-19 DIAGNOSIS — E538 Deficiency of other specified B group vitamins: Secondary | ICD-10-CM | POA: Diagnosis not present

## 2018-08-19 DIAGNOSIS — Z130 Encounter for screening for diseases of the blood and blood-forming organs and certain disorders involving the immune mechanism: Secondary | ICD-10-CM | POA: Diagnosis not present

## 2018-08-19 DIAGNOSIS — Z131 Encounter for screening for diabetes mellitus: Secondary | ICD-10-CM

## 2018-08-19 NOTE — Progress Notes (Signed)
[facility] [address] [place], [place] [address] [phone]  Date:  08/19/2018   Name:  Annette Parks   DOB:  1990-08-22   MRN:  [id number]  PCP:  Darreld Mclean, MD    Chief Complaint: No chief complaint on file.   History of Present Illness:  Annette Parks is a 28 y.o. very pleasant female patient who presents with the following:  Virtual visit today due to pandemic Pt location is her car/outside of work Provider location is home We had planned her annual CPE today but rescheduled due to pandemic Pt ID confirmed with name and date of birth, she gives consent for virtual visit today  Annette Parks is very worried about a thyroid problem today.  She notes a family history, and several symptoms which make her suspect she could have Hashimoto's thyroiditis  Her mother has Hashimoto's thyroiditis, and her GM and thyroid issues too.  Annette Parks notes thinning hair for the last year or so She got a haircut a month and a half ago- her hairdresser noted that her hair was thinning as well  She also has noted some mild swelling in her ankles at the end of the day, and she is feeling tired, cold all the time  She was under some stress a year ago when a friend passed away, but since then her life is been stable.  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 Hip pain, chronic, left 08/13/2017  . Lower back pain 08/11/2017  . Trochanteric bursitis of left hip 07/31/2017  . Sacroiliac (ligament) sprain 07/31/2017  . Common migraine 05/07/2016  . Eye pain, bilateral 05/07/2016  . Facial pain 05/07/2016  . GAD (generalized anxiety disorder) 03/22/2015  . Neck pain 07/11/2014  . Myalgia 07/11/2014  . Dysesthesia 07/11/2014    Past Medical History:  Diagnosis Date   . Anal fissure   . IBS (irritable bowel syndrome)   . Migraine headache   . Pelvic floor dysfunction?   . Perforated eardrum     Past Surgical History:  Procedure Laterality Date  . EAR TUBE REMOVAL    . TONSILLECTOMY AND ADENOIDECTOMY    . TYMPANOPLASTY Right   . TYMPANOSTOMY TUBE PLACEMENT     x 3  . WISDOM TOOTH EXTRACTION      Social History   Tobacco Use  . Smoking status: Never Smoker  . Smokeless tobacco: Never Used  Substance Use Topics  . Alcohol use: Yes    Alcohol/week: 0.0 standard drinks    Comment: occasional-2 per month  . Drug use: No    Family History  Problem Relation Age of Onset  . Migraines Mother   . Hyperlipidemia Father   . Diverticulitis Father        had to have part of colon removed  . Asthma Brother   . Diabetes Maternal Grandmother     No Known Allergies  Medication list has been reviewed and updated.  Current Outpatient Medications on File Prior to Visit  Medication Sig Dispense Refill  . albuterol (PROVENTIL HFA;VENTOLIN HFA) 108 (90 Base) MCG/ACT inhaler Inhale 2 puffs into the lungs every 4 (four) hours as needed for wheezing or shortness of breath. 1 Inhaler 0  .  benzonatate (TESSALON) 100 MG capsule Take 1 capsule (100 mg total) by mouth 3 (three) times daily as needed for cough. 20 capsule 0  . cetirizine (ZYRTEC) 10 MG tablet Take by mouth.    Marland Kitchen FLUoxetine (PROZAC) 20 MG tablet Take 1 tablet (20 mg total) by mouth daily. Increase to 2 pills after 2 weeks 60 tablet 3  . Melatonin 3 MG TABS Take 1 tablet by mouth at bedtime.    . Multiple Vitamins-Minerals (MULTIVITAMIN ADULT PO)     . vitamin B-12 (CYANOCOBALAMIN) 1000 MCG tablet Take 1,000 mcg by mouth daily.     No current facility-administered medications on file prior to visit.     Review of Systems:  She has not been sick No fever, chills, cough She notes no risk of pregnancy  Physical Examination: There were no vitals filed for this visit. There were no vitals  filed for this visit. There is no height or weight on file to calculate BMI. Ideal Body Weight:    Patient is observed over camera.  She appears her normal self, healthy.  No cough, tachypnea, distress is noted  Assessment and Plan: Family history of thyroiditis - Plan: TSH, Thyroglobulin antibody, Thyroid peroxidase antibody, T3  B12 deficiency - Plan: B12  Screening for diabetes mellitus - Plan: Comprehensive metabolic panel  Screening for deficiency anemia - Plan: CBC  Vitamin D deficiency - Plan: Vitamin D (25 hydroxy)  Annette Parks is very concerned that she may have a thyroid disorder, pacifically Hashimoto's thyroiditis.  She also wonders if she might of vitamin deficiency or other problem causing her thinning hair and systemic symptoms. We decided to have her come in for labs, prior to referring her to endocrinology.  I explained that endocrinology would like to have some preliminary information prior to seeing her She will come in tomorrow for a lab draw I will be in touch pending her results Signed Lamar Blinks, MD

## 2018-08-20 ENCOUNTER — Other Ambulatory Visit (INDEPENDENT_AMBULATORY_CARE_PROVIDER_SITE_OTHER): Payer: 59

## 2018-08-20 ENCOUNTER — Other Ambulatory Visit: Payer: Self-pay

## 2018-08-20 DIAGNOSIS — R946 Abnormal results of thyroid function studies: Secondary | ICD-10-CM

## 2018-08-20 DIAGNOSIS — E559 Vitamin D deficiency, unspecified: Secondary | ICD-10-CM

## 2018-08-20 DIAGNOSIS — Z8349 Family history of other endocrine, nutritional and metabolic diseases: Secondary | ICD-10-CM | POA: Diagnosis not present

## 2018-08-20 DIAGNOSIS — E538 Deficiency of other specified B group vitamins: Secondary | ICD-10-CM

## 2018-08-20 DIAGNOSIS — Z131 Encounter for screening for diabetes mellitus: Secondary | ICD-10-CM

## 2018-08-20 DIAGNOSIS — Z130 Encounter for screening for diseases of the blood and blood-forming organs and certain disorders involving the immune mechanism: Secondary | ICD-10-CM | POA: Diagnosis not present

## 2018-08-20 LAB — COMPREHENSIVE METABOLIC PANEL
ALT: 13 U/L (ref 0–35)
AST: 12 U/L (ref 0–37)
Albumin: 4.4 g/dL (ref 3.5–5.2)
Alkaline Phosphatase: 54 U/L (ref 39–117)
BUN: 9 mg/dL (ref 6–23)
CO2: 29 mEq/L (ref 19–32)
Calcium: 9.5 mg/dL (ref 8.4–10.5)
Chloride: 103 mEq/L (ref 96–112)
Creatinine, Ser: 0.71 mg/dL (ref 0.40–1.20)
GFR: 98.09 mL/min (ref >60.00)
Glucose, Bld: 86 mg/dL (ref 70–99)
Potassium: 3.8 mEq/L (ref 3.5–5.1)
Sodium: 139 mEq/L (ref 135–145)
Total Bilirubin: 0.9 mg/dL (ref 0.2–1.2)
Total Protein: 7 g/dL (ref 6.0–8.3)

## 2018-08-20 LAB — VITAMIN B12: Vitamin B-12: 527 pg/mL (ref 211–911)

## 2018-08-20 LAB — TSH: TSH: 1.66 u[IU]/mL (ref 0.35–4.50)

## 2018-08-20 LAB — CBC
HCT: 42.3 % (ref 36.0–46.0)
Hemoglobin: 14.4 g/dL (ref 12.0–15.0)
MCHC: 34 g/dL (ref 30.0–36.0)
MCV: 82.4 fl (ref 78.0–100.0)
Platelets: 304 10*3/uL (ref 150.0–400.0)
RBC: 5.14 Mil/uL — ABNORMAL HIGH (ref 3.87–5.11)
RDW: 12.9 % (ref 11.5–15.5)
WBC: 7.7 10*3/uL (ref 4.0–10.5)

## 2018-08-20 LAB — VITAMIN D 25 HYDROXY (VIT D DEFICIENCY, FRACTURES): VITD: 37.52 ng/mL (ref 30.00–100.00)

## 2018-08-21 LAB — T3: T3, Total: 180 ng/dL (ref 76–181)

## 2018-08-21 LAB — THYROGLOBULIN ANTIBODY: Thyroglobulin Ab: 1 IU/mL (ref <=1)

## 2018-08-21 LAB — THYROID PEROXIDASE ANTIBODY: Thyroperoxidase Ab SerPl-aCnc: 23 IU/mL — ABNORMAL HIGH (ref <9)

## 2018-08-22 ENCOUNTER — Encounter: Payer: Self-pay | Admitting: Family Medicine

## 2018-08-22 NOTE — Addendum Note (Signed)
Addended by: Lamar Blinks C on: 08/22/2018 07:43 AM   Modules accepted: Orders

## 2018-08-25 ENCOUNTER — Telehealth: Payer: 59 | Admitting: Neurology

## 2018-09-02 ENCOUNTER — Other Ambulatory Visit: Payer: Self-pay

## 2018-09-02 MED FILL — LARISSIA 0.1-20 MG-MCG TABS: 0.1-20 | 84 days supply | Qty: 84 | Fill #0

## 2018-09-04 ENCOUNTER — Ambulatory Visit (INDEPENDENT_AMBULATORY_CARE_PROVIDER_SITE_OTHER): Payer: 59 | Admitting: Internal Medicine

## 2018-09-04 ENCOUNTER — Other Ambulatory Visit: Payer: Self-pay

## 2018-09-04 ENCOUNTER — Encounter: Payer: Self-pay | Admitting: Internal Medicine

## 2018-09-04 VITALS — BP 110/70 | HR 104 | Temp 98.4°F | Ht 65.0 in | Wt 130.0 lb

## 2018-09-04 DIAGNOSIS — L659 Nonscarring hair loss, unspecified: Secondary | ICD-10-CM | POA: Diagnosis not present

## 2018-09-04 DIAGNOSIS — E063 Autoimmune thyroiditis: Secondary | ICD-10-CM | POA: Insufficient documentation

## 2018-09-04 NOTE — Patient Instructions (Addendum)
Please try to start: - Selenium 200 mcg daily - B complex or Hair Skin and Nails  Stay off the B complex for 1 week before thyroid check.  Please come back for a follow-up appointment in 6 months.

## 2018-09-04 NOTE — Progress Notes (Signed)
Patient ID: Annette Parks, female   DOB: May 08, 1990, 28 y.o.   MRN: [id number]    HPI  Annette Parks is a 28 y.o.-year-old female, referred by her PCP, Dr. Edilia Bo, for management of Hashimoto's thyroiditis.  Her mother, [name], is also my patient.  She started to have thinning hair in last 1-1.5 years. Also, more hair loss.  Pt. has been dx with Hashimoto's thyroiditis hypothyroidism in 07/2018; she is not on Levothyroxine as her TFTs remained normal.  I reviewed pt's thyroid tests: Lab Results  Component Value Date   TSH 1.66 08/20/2018   TSH 1.73 08/05/2016   TSH 1.30 02/16/2016   TSH 1.529 03/22/2015    Her thyroid antibodies were positive Component     Latest Ref Rng & Units 03/22/2015 08/20/2018  Thyroperoxidase Ab SerPl-aCnc     <9 IU/mL 2 23 (H)   Pt describes: - + Mild weight gain - + fatigue - + Both feet cold intolerance - + anxiety, no depression - + constipation - no dry skin - Along with hair thinning and loss  Pt denies feeling nodules in neck, hoarseness, dysphagia/odynophagia, SOB with lying down.  She has + FH of thyroid disorders in: mother, MGM. No FH of thyroid cancer. No FH of autoimmune ds. No h/o radiation tx to head or neck. No recent use of iodine supplements.  Pt. also has a history of Raynauds phenomenon. She is on OCPs - since a teenager. She tried to stop x 8 mo  Last year - restarted 5 mo ago b/c acne and also dysmenorrhea. She also has a history of heart valve insufficiency. She has neck, lower back pain, hip pain; she was diagnosed with spina bifida occulta. She has constipation (chronic) anal fissures. She has hyperlipidemia.  She will get later this year  ROS: Constitutional: + See HPI, + insomnia Eyes: no blurry vision, no xerophthalmia ENT: no sore throat, + see HPI Cardiovascular: no CP/SOB/palpitations/+ leg swelling Respiratory: no cough/SOB Gastrointestinal: no N/V/D/ + C Musculoskeletal: no muscle/joint aches Skin: no  rashes, + hair loss Neurological: no tremors/numbness/tingling/dizziness, + migraines  - menstrual Psychiatric: no depression/+ anxiety  Past Medical History:  Diagnosis Date  . Anal fissure   . IBS (irritable bowel syndrome)   . Migraine headache   . Pelvic floor dysfunction?   . Perforated eardrum    Past Surgical History:  Procedure Laterality Date  . EAR TUBE REMOVAL    . TONSILLECTOMY AND ADENOIDECTOMY    . TYMPANOPLASTY Right   . TYMPANOSTOMY TUBE PLACEMENT     x 3  . WISDOM TOOTH EXTRACTION     Social History   Socioeconomic History  . Marital status: Single    Spouse name: Not on file  . Number of children: 0  . Years of education: Not on file  . Highest education level: Not on file  Occupational History  . Occupation: Equities trader  Social Needs  . Financial resource strain: Not on file  . Food insecurity:    Worry: Not on file    Inability: Not on file  . Transportation needs:    Medical: Not on file    Non-medical: Not on file  Tobacco Use  . Smoking status: Never Smoker  . Smokeless tobacco: Never Used  Substance and Sexual Activity  . Alcohol use: Yes    Alcohol/week: 0.0 standard drinks    Comment: occasional-2 per month  . Drug use: No  . Sexual activity: Not on file  Lifestyle  . Physical activity:    Days per week: Not on file    Minutes per session: Not on file  . Stress: Not on file  Relationships  . Social connections:    Talks on phone: Not on file    Gets together: Not on file    Attends religious service: Not on file    Active member of club or organization: Not on file    Attends meetings of clubs or organizations: Not on file    Relationship status: Not on file  . Intimate partner violence:    Fear of current or ex partner: Not on file    Emotionally abused: Not on file    Physically abused: Not on file    Forced sexual activity: Not on file  Other Topics Concern  . Not on file  Social History Narrative   Single, no  children    moved here from [place] went to [facility]. Moved here after her parents moved here.   Clinic nurse in [employer]   Current Outpatient Medications on File Prior to Visit  Medication Sig Dispense Refill  . cetirizine (ZYRTEC) 10 MG tablet Take by mouth.    . levonorgestrel-ethinyl estradiol (LUTERA) 0.1-20 MG-MCG tablet     . Multiple Vitamins-Minerals (MULTIVITAMIN ADULT PO)      No current facility-administered medications on file prior to visit.    No Known Allergies Family History  Problem Relation Age of Onset  . Migraines Mother   . Hyperlipidemia Father   . Diverticulitis Father        had to have part of colon removed  . Asthma Brother   . Diabetes Maternal Grandmother     PE: BP 110/70   Pulse (!) 104   Temp 98.4 F (36.9 C)   Ht 5\' 5"  (1.651 m)   Wt 130 lb (59 kg)   LMP 08/12/2018   SpO2 98%   BMI 21.63 kg/m  Wt Readings from Last 3 Encounters:  09/04/18 130 lb (59 kg)  12/25/17 133 lb (60.3 kg)  09/29/17 130 lb (59 kg)   Constitutional: Normal weight, in NAD Eyes: PERRLA, EOMI, no exophthalmos ENT: moist mucous membranes, no thyromegaly, no cervical lymphadenopathy Cardiovascular: tachycardia, RR, No MRG Respiratory: CTA B Gastrointestinal: abdomen soft, NT, ND, BS+ Musculoskeletal: no deformities, strength intact in all 4 Skin: moist, warm, no rashes Neurological: no tremor with outstretched hands, DTR normal in all 4  ASSESSMENT: 1. Hashimoto thyroiditis  2.  Hair loss  PLAN: 1. Hashimoto thyroiditis - I reviewed her available TFTs and also antibody levels along with the patient. I pointed out that her TFTs remain normal while her antibodies are now elevated, pointing towards euthyroid Hashimoto's thyroiditis. - we had a long discussion about her Hashimoto thyroiditis diagnosis. I explained that this is an autoimmune disorder, in which she develops antibodies against her own thyroid. The  antibodies bind to the thyroid tissue and cause inflammation, and, eventually, destruction of the gland and hypothyroidism. We don't know how long this process can be, it can last from months to years. As of now, based on the last results that I have, her thyroid tests are normal.  - I also explained that thyroid enlargement especially at the beginning of her Hashimoto thyroiditis course is not uncommon, and it has a waxing and waning character.  She is not feeling any neck compression symptoms at the moment - We discussed about treatment for Hashimoto thyroiditis, which is  actually limited to thyroid hormones in case her TFTs are abnormal. Supplements like selenium has been tried with various results, some showing improvement in the TPO antibodies. However, there are no randomized controlled trials of this are consistent results between trials. We also discussed about ways to improve her immune system (relaxation, diet, exercise, sleep) to reduce the Ab titer and, subsequently, the thyroid inflammation. -In her case, since she does have hair loss could be related to Hashimoto's thyroiditis, I did suggest to start selenium 200 mcg daily.  We will recheck her thyroid antibodies at next visit - I will see her back in 6 months  2.  Hair loss and thinning -Possibly related to Hashimoto thyroiditis.  We will start selenium which may help. -However, this is also possibly related to her stopping the OCPs last year for 8 months.  She restarted them approximately 5 months ago and we discussed that this is not a long time in the lifecycle of the appointment.  However, she is having increased migraines lately and she will have an appointment with OB/GYN soon.  We discussed the possibility of changing her OCPs. -Of note, recent vitamins B12 and D were normal.  Of note, she does have a history of B12 deficiency and was on IM injections. -It is possible that she has other vitamin D deficiencies and I advised her to  start a B complex for hair skin and nails.  I advised her not to forget to stop this for a week prior to our next visit since the biotin can interfere with the TFTs.  Patient Instructions  Please try to start: - Selenium 200 mcg daily - B complex or Hair Skin and Nails  Stay off the B complex for 1 week before thyroid check.  Please come back for a follow-up appointment in 6 months.  Philemon Kingdom, MD PhD [facility]

## 2018-09-09 IMAGING — MR MR HIP*L* WO/W CM
8 series · 40 of 40 positions shown · IV contrast (multihance)
Comparison: Plain films of the left hip 08/11/2017.

CLINICAL DATA: Left hip pain in a patient who is a long distance
runner for approximately 6 weeks. No known injury.

EXAM:
MRI OF THE LEFT HIP WITHOUT AND WITH CONTRAST
TECHNIQUE: Multiplanar, multisequence MR imaging was performed both before and
after administration of intravenous contrast.
CONTRAST:  11 ml MULTIHANCE GADOBENATE DIMEGLUMINE 529 MG/ML IV SOLN

[Series 3: STIR · coronal · 4.0mm · 1.19mm/px · 6 of 34 slices shown]
[im 1/34]
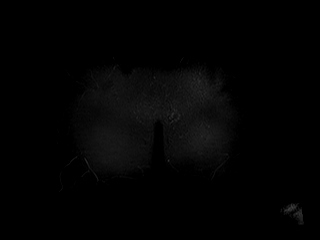
[im 7/34]
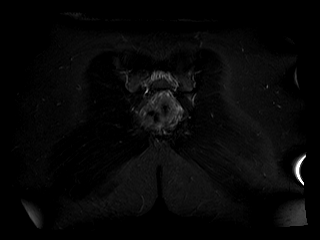
[im 14/34]
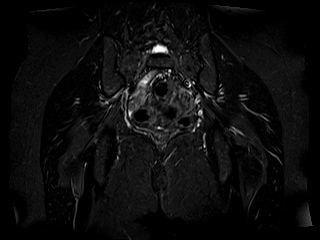
[im 20/34]
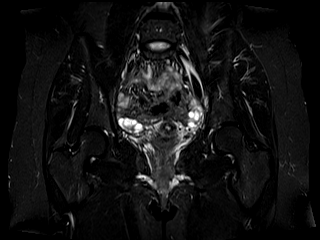
[im 27/34]
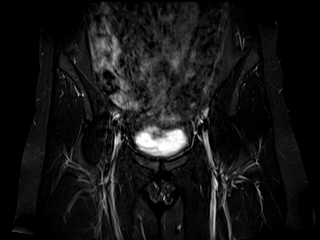
[im 34/34]
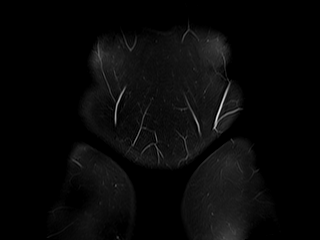

[Series 4: T1 · coronal · 4.0mm · 0.85mm/px · 6 of 34 slices shown (1 of 2)]
[im 1/34]
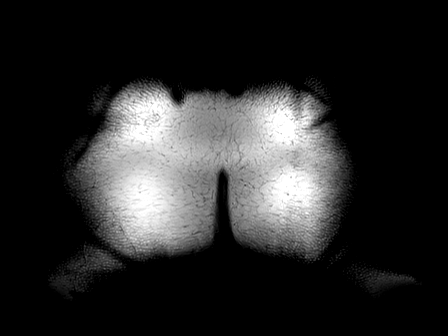
[im 7/34]
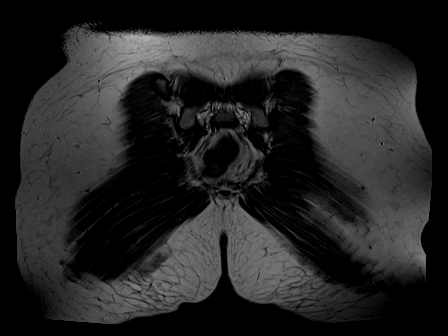
[im 14/34]
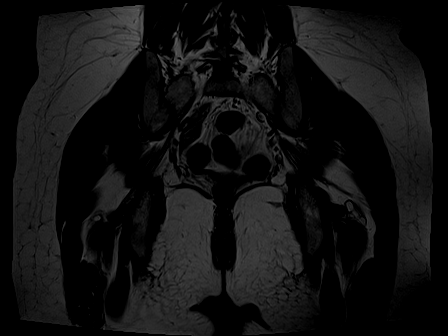
[im 20/34]
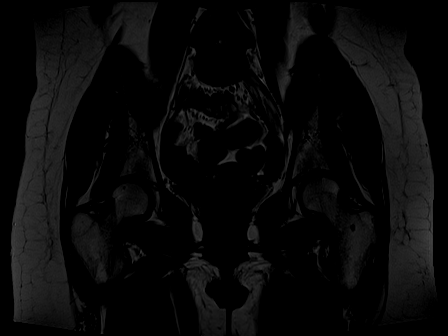
[im 27/34]
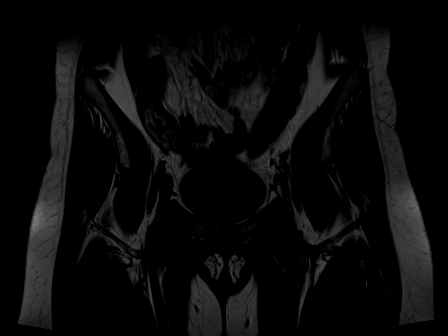
[im 34/34]
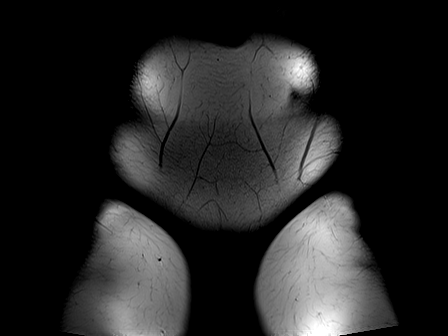

[Series 5: T1 · axial · 4.5mm · 0.85mm/px · z∈[-117,+86]mm · 5 of 37 slices shown (2 of 2)]
[im 1/37]
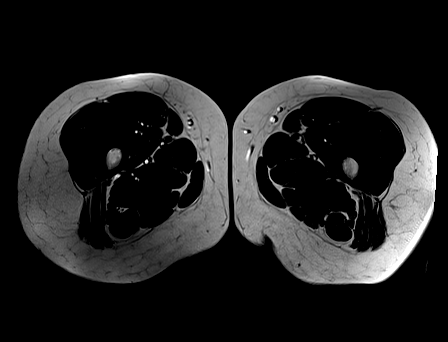
[im 10/37]
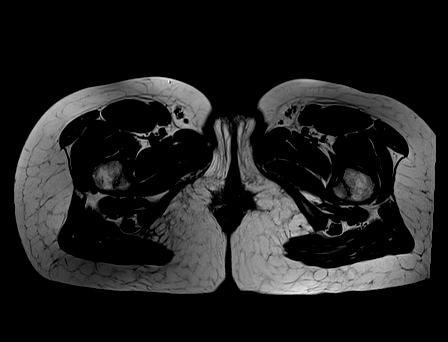
[im 19/37]
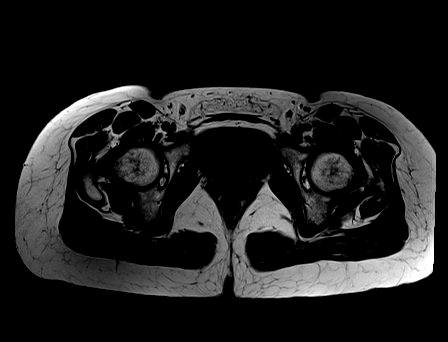
[im 28/37]
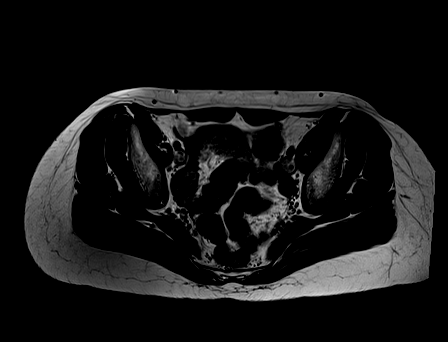
[im 37/37]
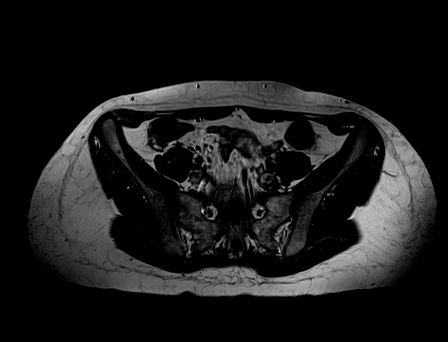

[Series 6: T2 fat-sat · axial · 4.5mm · 0.85mm/px · z∈[-117,+86]mm · 5 of 37 slices shown]
[im 1/37]
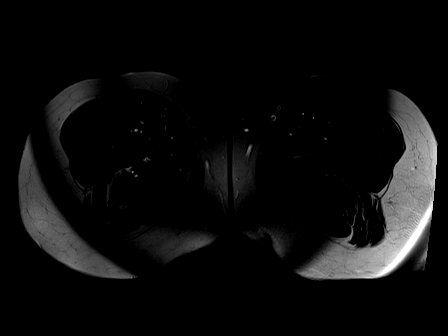
[im 10/37]
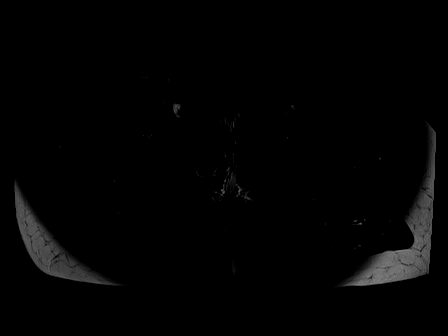
[im 19/37]
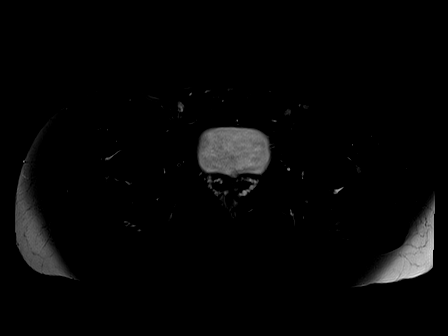
[im 28/37]
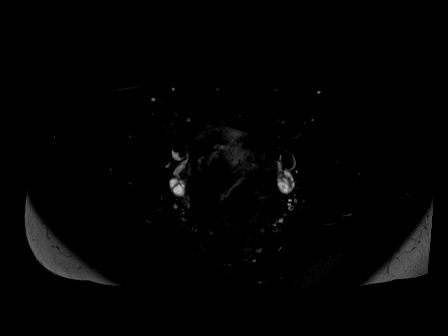
[im 37/37]
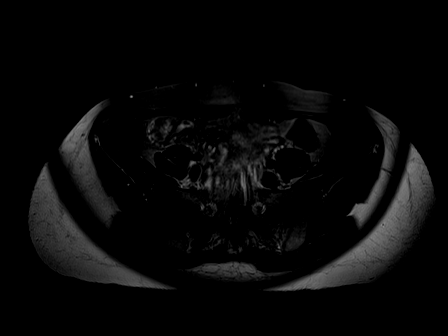

[Series 7: axial t1fs pre(pelvis) · axial · non-contrast · 4.5mm · 1.48mm/px · z∈[-117,+86]mm · 5 of 37 slices shown]
[im 1/37]
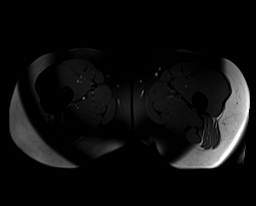
[im 10/37]
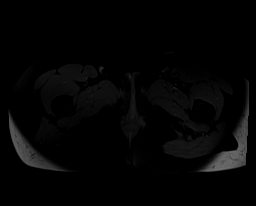
[im 19/37]
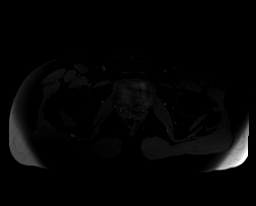
[im 28/37]
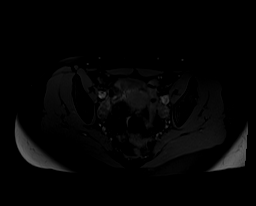
[im 37/37]
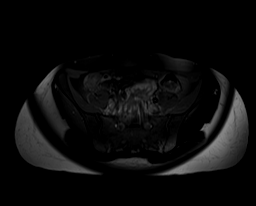

[Series 8: PD fat-sat · sagittal · 4.5mm · 0.27mm/px · 3 of 23 slices shown]
[im 1/23]
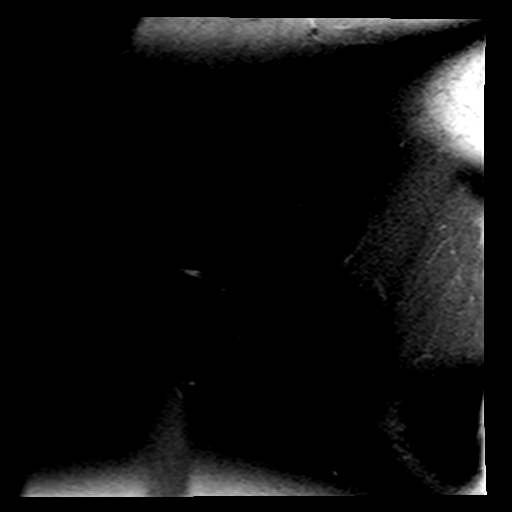
[im 12/23]
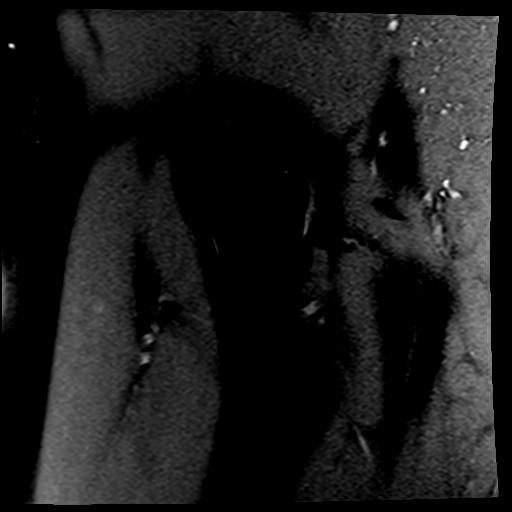
[im 23/23]
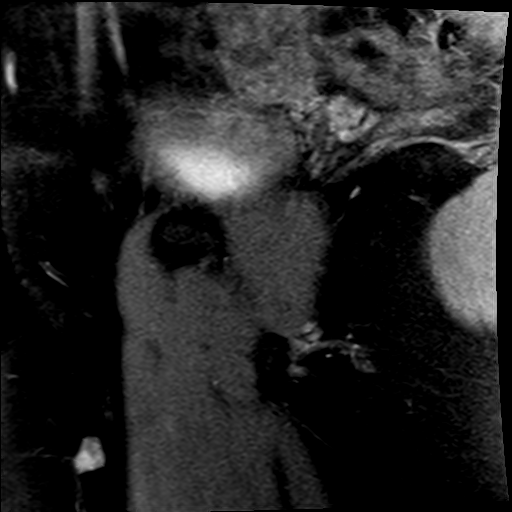

[Series 9: axial t1fs post(pelvis) · axial · 4.5mm · 1.48mm/px · z∈[-117,+86]mm · 5 of 37 slices shown]
[im 1/37]
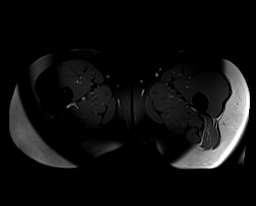
[im 10/37]
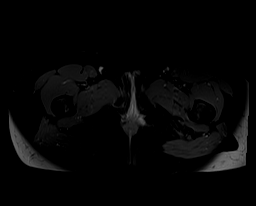
[im 19/37]
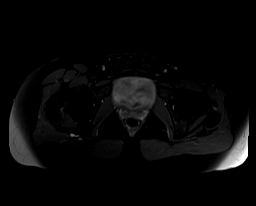
[im 28/37]
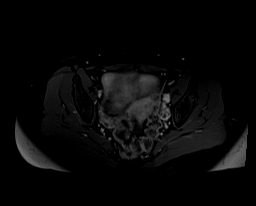
[im 37/37]
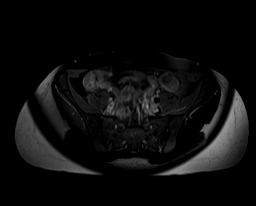

[Series 10: T1 fat-sat post-contrast · coronal · 4.0mm · 0.85mm/px · 5 of 34 slices shown]
[im 1/34]
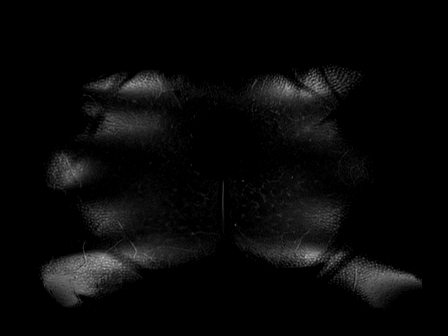
[im 9/34]
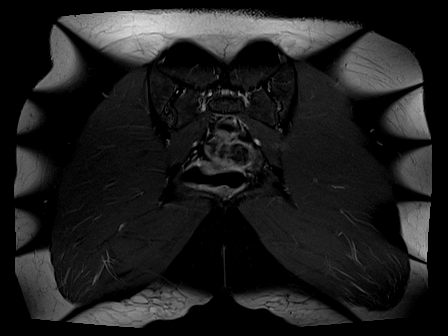
[im 17/34]
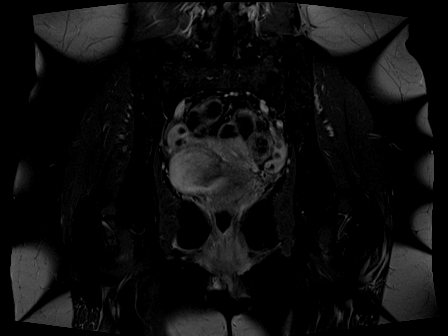
[im 25/34]
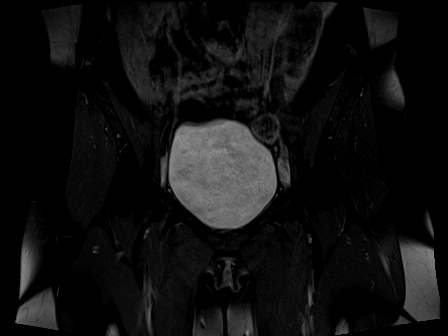
[im 34/34]
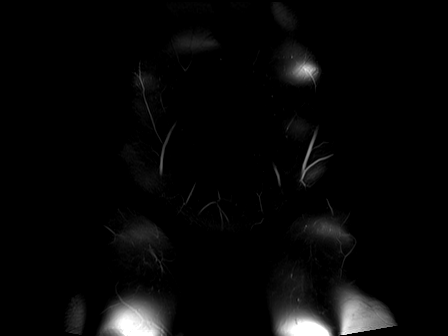

[40 of 40 positions shown; findings below may reference images not displayed]

FINDINGS: Bones: Marrow signal is normal throughout without fracture, stress
change or focal lesion. There is no avascular necrosis of the
femoral heads. No subchondral cyst formation or edema about the hips
is identified.

Articular cartilage and labrum

Articular cartilage:  Intact.

Labrum:  Intact.

Joint or bursal effusion

Joint effusion:  None.

Bursae: Negative.

Muscles and tendons

Muscles and tendons:  Intact and normal in appearance.

Other findings

Miscellaneous:   Imaged intrapelvic contents appear normal.
IMPRESSION: Normal examination.  No finding to explain the patient's symptoms.

## 2018-09-29 DIAGNOSIS — Z01419 Encounter for gynecological examination (general) (routine) without abnormal findings: Secondary | ICD-10-CM | POA: Diagnosis not present

## 2018-09-29 DIAGNOSIS — N941 Unspecified dyspareunia: Secondary | ICD-10-CM | POA: Diagnosis not present

## 2018-09-29 DIAGNOSIS — Z6821 Body mass index (BMI) 21.0-21.9, adult: Secondary | ICD-10-CM | POA: Diagnosis not present

## 2018-09-29 DIAGNOSIS — Z309 Encounter for contraceptive management, unspecified: Secondary | ICD-10-CM | POA: Diagnosis not present

## 2018-11-24 ENCOUNTER — Telehealth: Payer: Self-pay | Admitting: Family Medicine

## 2018-11-24 NOTE — Telephone Encounter (Signed)
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 Please advise if ok to schedule for late day CPE.

## 2018-11-25 NOTE — Telephone Encounter (Signed)
Sure that is fine.

## 2018-11-26 NOTE — Telephone Encounter (Signed)
Pt called back to sched.  Please call pt: [phone] ok to leave detailed voicemail

## 2018-12-07 MED FILL — LEVONOR-ETH ESTRAD 0.1-0.02: 0.1-20 | 84 days supply | Qty: 84 | Fill #1

## 2018-12-09 ENCOUNTER — Encounter: Payer: 59 | Admitting: Family Medicine

## 2018-12-13 NOTE — Progress Notes (Addendum)
[facility] [place] [facility] [address] [place], [place] [address] [phone]  Date:  12/16/2018   Name:  Annette Parks   DOB:  April 21, 1991   MRN:  [id number]  PCP:  Darreld Mclean, MD    Chief Complaint: Annual Exam   History of Present Illness:  Annette Parks is a 28 y.o. very pleasant female patient who presents with the following:  Here today for complete physical- last seen by myself in April Recently diagnosed with Hashimoto's thyroiditis, managed by Dr. Cruzita Lederer.  Last visit with her was in May-per these notes, she is currently euthyroid and hence is not taking thyroid replacement She does have some hair thinning possibly related to Hashimoto's thyroiditis She is taking selenium and a hair skin and nails  She does feel like her hair is improved   Annette Parks is engaged, they plan to be married this fall- they plan to go ahead with plans to be married this year pap: 2019 per [facility]- she sees them as needed  Labs done in April, except no cholesterol panel- done for her today Will get her flu shot at work  She works at [employer]  She is overall doing well Moving into a new place next month - apt- they plan to get a house evenutually   Patient Active Problem List   Diagnosis Date Noted  . Hashimoto's thyroiditis 09/04/2018  . Hair loss 09/04/2018  . Hip pain, chronic, left 08/13/2017  . Lower back pain 08/11/2017  . Trochanteric bursitis of left hip 07/31/2017  . Sacroiliac (ligament) sprain 07/31/2017  . Common migraine 05/07/2016  . Eye pain, bilateral 05/07/2016  . Facial pain 05/07/2016  . GAD (generalized anxiety disorder) 03/22/2015  . Neck pain 07/11/2014  . Myalgia 07/11/2014  . Dysesthesia 07/11/2014    Past Medical History:  Diagnosis Date  . Anal fissure   . IBS (irritable bowel syndrome)   . Migraine headache   . Pelvic floor dysfunction?   . Perforated eardrum     Past Surgical History:   Procedure Laterality Date  . EAR TUBE REMOVAL    . TONSILLECTOMY AND ADENOIDECTOMY    . TYMPANOPLASTY Right   . TYMPANOSTOMY TUBE PLACEMENT     x 3  . WISDOM TOOTH EXTRACTION      Social History   Tobacco Use  . Smoking status: Never Smoker  . Smokeless tobacco: Never Used  Substance Use Topics  . Alcohol use: Yes    Alcohol/week: 0.0 standard drinks    Comment: occasional-2 per month  . Drug use: No    Family History  Problem Relation Age of Onset  . Migraines Mother   . Hyperlipidemia Father   . Diverticulitis Father        had to have part of colon removed  . Asthma Brother   . Diabetes Maternal Grandmother     No Known Allergies  Medication list has been reviewed and updated.  Current Outpatient Medications on File Prior to Visit  Medication Sig Dispense Refill  . cetirizine (ZYRTEC) 10 MG tablet Take by mouth.    . levonorgestrel-ethinyl estradiol (LUTERA) 0.1-20 MG-MCG tablet     . Multiple Vitamins-Minerals (HAIR SKIN AND NAILS FORMULA PO) Body, Hair, Skin and Nails    . Multiple Vitamins-Minerals (MULTIVITAMIN ADULT PO)     . Selenium 200 MCG CAPS      No current facility-administered medications on file prior to visit.  Review of Systems:  As per HPI- otherwise negative.  She has had to alter her exercise routine  Never a smoker Rare alcohol, she is not able to tolerate much   Physical Examination: Vitals:   12/16/18 1618  BP: 118/82  Pulse: 95  Resp: 16  Temp: 98 F (36.7 C)  SpO2: 99%   Vitals:   12/16/18 1618  Weight: 132 lb (59.9 kg)  Height: 5\' 5"  (1.651 m)   Body mass index is 21.97 kg/m. Ideal Body Weight: Weight in (lb) to have BMI = 25: 149.9  GEN: WDWN, NAD, Non-toxic, A & O x 3, normal weight, looks well  HEENT: Atraumatic, Normocephalic. Neck supple. No masses, No LAD. TM wnl  Ears and Nose: No external deformity. CV: RRR, No M/G/R. No JVD. No thrill. No extra heart sounds. PULM: CTA B, no wheezes, crackles,  rhonchi. No retractions. No resp. distress. No accessory muscle use. ABD: S, NT, ND, +BS. No rebound. No HSM. EXTR: No c/c/e NEURO Normal gait.  PSYCH: Normally interactive. Conversant. Not depressed or anxious appearing.  Calm demeanor.    Assessment and Plan:   ICD-10-CM   1. Annual physical exam  Z00.00 Selenium 200 MCG CAPS    Multiple Vitamins-Minerals (HAIR SKIN AND NAILS FORMULA PO)  2. Screening for hyperlipidemia  Z13.220 Lipid panel   Here today for a CPE No concerns today Her thyroiditis is managed by endocrinology Lipid panel pending Continue good health habits Will plan further follow- up pending labs.   Follow-up: No follow-ups on file.  No orders of the defined types were placed in this encounter.  Orders Placed This Encounter  Procedures  . Lipid panel    @SIGN @    Signed Lamar Blinks, MD  8/20, received her cholesterol panel Message to patient  Results for orders placed or performed in visit on 12/16/18  Lipid panel  Result Value Ref Range   Cholesterol 148 0 - 200 mg/dL   Triglycerides 110.0 0.0 - 149.0 mg/dL   HDL 58.60 >39.00 mg/dL   VLDL 22.0 0.0 - 40.0 mg/dL   LDL Cholesterol 68 0 - 99 mg/dL   Total CHOL/HDL Ratio 3    NonHDL 89.84

## 2018-12-16 ENCOUNTER — Other Ambulatory Visit: Payer: Self-pay

## 2018-12-16 ENCOUNTER — Ambulatory Visit (INDEPENDENT_AMBULATORY_CARE_PROVIDER_SITE_OTHER): Payer: 59 | Admitting: Family Medicine

## 2018-12-16 ENCOUNTER — Encounter: Payer: Self-pay | Admitting: Family Medicine

## 2018-12-16 VITALS — BP 118/82 | HR 95 | Temp 98.0°F | Resp 16 | Ht 65.0 in | Wt 132.0 lb

## 2018-12-16 DIAGNOSIS — Z0000 Encounter for general adult medical examination without abnormal findings: Secondary | ICD-10-CM

## 2018-12-16 DIAGNOSIS — Z13220 Encounter for screening for lipoid disorders: Secondary | ICD-10-CM

## 2018-12-16 NOTE — Patient Instructions (Signed)
Great to see you again- take care and best of luck with wedding planning!  I will be in touch with your lipid profile asap Keep up the good work taking care of yourself   Health Maintenance, Female Adopting a healthy lifestyle and getting preventive care are important in promoting health and wellness. Ask your health care provider about:  The right schedule for you to have regular tests and exams.  Things you can do on your own to prevent diseases and keep yourself healthy. What should I know about diet, weight, and exercise? Eat a healthy diet   Eat a diet that includes plenty of vegetables, fruits, low-fat dairy products, and lean protein.  Do not eat a lot of foods that are high in solid fats, added sugars, or sodium. Maintain a healthy weight Body mass index (BMI) is used to identify weight problems. It estimates body fat based on height and weight. Your health care provider can help determine your BMI and help you achieve or maintain a healthy weight. Get regular exercise Get regular exercise. This is one of the most important things you can do for your health. Most adults should:  Exercise for at least 150 minutes each week. The exercise should increase your heart rate and make you sweat (moderate-intensity exercise).  Do strengthening exercises at least twice a week. This is in addition to the moderate-intensity exercise.  Spend less time sitting. Even light physical activity can be beneficial. Watch cholesterol and blood lipids Have your blood tested for lipids and cholesterol at 28 years of age, then have this test every 5 years. Have your cholesterol levels checked more often if:  Your lipid or cholesterol levels are high.  You are older than 28 years of age.  You are at high risk for heart disease. What should I know about cancer screening? Depending on your health history and family history, you may need to have cancer screening at various ages. This may include  screening for:  Breast cancer.  Cervical cancer.  Colorectal cancer.  Skin cancer.  Lung cancer. What should I know about heart disease, diabetes, and high blood pressure? Blood pressure and heart disease  High blood pressure causes heart disease and increases the risk of stroke. This is more likely to develop in people who have high blood pressure readings, are of African descent, or are overweight.  Have your blood pressure checked: ? Every 3-5 years if you are 25-60 years of age. ? Every year if you are 2 years old or older. Diabetes Have regular diabetes screenings. This checks your fasting blood sugar level. Have the screening done:  Once every three years after age 71 if you are at a normal weight and have a low risk for diabetes.  More often and at a younger age if you are overweight or have a high risk for diabetes. What should I know about preventing infection? Hepatitis B If you have a higher risk for hepatitis B, you should be screened for this virus. Talk with your health care provider to find out if you are at risk for hepatitis B infection. Hepatitis C Testing is recommended for:  Everyone born from [birth year] through 1965.  Anyone with known risk factors for hepatitis C. Sexually transmitted infections (STIs)  Get screened for STIs, including gonorrhea and chlamydia, if: ? You are sexually active and are younger than 28 years of age. ? You are older than 28 years of age and your health care provider tells you that  you are at risk for this type of infection. ? Your sexual activity has changed since you were last screened, and you are at increased risk for chlamydia or gonorrhea. Ask your health care provider if you are at risk.  Ask your health care provider about whether you are at high risk for HIV. Your health care provider may recommend a prescription medicine to help prevent HIV infection. If you choose to take medicine to prevent HIV, you should first get  tested for HIV. You should then be tested every 3 months for as long as you are taking the medicine. Pregnancy  If you are about to stop having your period (premenopausal) and you may become pregnant, seek counseling before you get pregnant.  Take 400 to 800 micrograms (mcg) of folic acid every day if you become pregnant.  Ask for birth control (contraception) if you want to prevent pregnancy. Osteoporosis and menopause Osteoporosis is a disease in which the bones lose minerals and strength with aging. This can result in bone fractures. If you are 19 years old or older, or if you are at risk for osteoporosis and fractures, ask your health care provider if you should:  Be screened for bone loss.  Take a calcium or vitamin D supplement to lower your risk of fractures.  Be given hormone replacement therapy (HRT) to treat symptoms of menopause. Follow these instructions at home: Lifestyle  Do not use any products that contain nicotine or tobacco, such as cigarettes, e-cigarettes, and chewing tobacco. If you need help quitting, ask your health care provider.  Do not use street drugs.  Do not share needles.  Ask your health care provider for help if you need support or information about quitting drugs. Alcohol use  Do not drink alcohol if: ? Your health care provider tells you not to drink. ? You are pregnant, may be pregnant, or are planning to become pregnant.  If you drink alcohol: ? Limit how much you use to 0-1 drink a day. ? Limit intake if you are breastfeeding.  Be aware of how much alcohol is in your drink. In the [place] one drink equals one 12 oz bottle of beer (355 mL), one 5 oz glass of wine (148 mL), or one 1 oz glass of hard liquor (44 mL). General instructions  Schedule regular health, dental, and eye exams.  Stay current with your vaccines.  Tell your health care provider if: ? You often feel depressed. ? You have ever been abused or do not feel safe at home.  Summary  Adopting a healthy lifestyle and getting preventive care are important in promoting health and wellness.  Follow your health care provider's instructions about healthy diet, exercising, and getting tested or screened for diseases.  Follow your health care provider's instructions on monitoring your cholesterol and blood pressure. This information is not intended to replace advice given to you by your health care provider. Make sure you discuss any questions you have with your health care provider. Document Released: 10/29/2010 Document Revised: 04/08/2018 Document Reviewed: 04/08/2018 Elsevier Patient Education  2020 [employer].

## 2018-12-17 ENCOUNTER — Encounter: Payer: Self-pay | Admitting: Family Medicine

## 2018-12-17 LAB — LIPID PANEL
Cholesterol: 148 mg/dL (ref 0–200)
HDL: 58.6 mg/dL (ref >39.00)
LDL Cholesterol: 68 mg/dL (ref 0–99)
NonHDL: 89.84
Total CHOL/HDL Ratio: 3
Triglycerides: 110 mg/dL (ref 0.0–149.0)
VLDL: 22 mg/dL (ref 0.0–40.0)

## 2019-02-22 MED FILL — LEVONOR-ETH ESTRAD 0.1-0.02: 0.1-20 | 84 days supply | Qty: 84 | Fill #0

## 2019-02-26 MED FILL — LEVONOR-ETH ESTRAD 0.1-0.02: 0.1-20 | 84 days supply | Qty: 84 | Fill #0

## 2019-03-08 ENCOUNTER — Other Ambulatory Visit (INDEPENDENT_AMBULATORY_CARE_PROVIDER_SITE_OTHER): Payer: 59

## 2019-03-08 ENCOUNTER — Ambulatory Visit: Payer: 59 | Admitting: Internal Medicine

## 2019-03-08 ENCOUNTER — Encounter: Payer: Self-pay | Admitting: Internal Medicine

## 2019-03-08 ENCOUNTER — Other Ambulatory Visit: Payer: Self-pay

## 2019-03-08 VITALS — BP 118/80 | HR 106 | Ht 65.0 in | Wt 131.0 lb

## 2019-03-08 DIAGNOSIS — E063 Autoimmune thyroiditis: Secondary | ICD-10-CM | POA: Diagnosis not present

## 2019-03-08 DIAGNOSIS — L659 Nonscarring hair loss, unspecified: Secondary | ICD-10-CM

## 2019-03-08 NOTE — Progress Notes (Signed)
Patient ID: Annette Parks, female   DOB: 1990/10/04, 28 y.o.   MRN: [id number]    HPI  Annette Parks is a 28 y.o.-year-old female, referred by her PCP, Dr. Edilia Bo, for management of Hashimoto's thyroiditis.  Last visit 6 months ago. Her mother, Annette Parks, is also my patient.  She was diagnosed with Hashimoto's hypothyroidism in 07/2018.  She is not on levothyroxine yet as her TFTs remained normal.  At last visit she was complaining of hair loss and thinning and I advised her to start a Hair Skin and Nails supplement.  On this,  she feels that her hair is not falling as much anymore but it is still thinner and maybe drier.  Reviewed her TFTs: Lab Results  Component Value Date   TSH 1.66 08/20/2018   TSH 1.73 08/05/2016   TSH 1.30 02/16/2016   TSH 1.529 03/22/2015   Her thyroid antibodies are positive: Component     Latest Ref Rng & Units 08/20/2018  Thyroglobulin Ab     < or = 1 IU/mL 1   Component     Latest Ref Rng & Units 03/22/2015 08/20/2018  Thyroperoxidase Ab SerPl-aCnc     <9 IU/mL 2 23 (H)   At last visit, she described mild weight gain, fatigue, cold intolerance, anxiety, constipation.  She also had hair thinning and loss.  As of now, she is still tired and she has had standing, but the rest of the symptoms are better.  Pt denies: - feeling nodules in neck - hoarseness - dysphagia - choking - SOB with lying down  She has + FH of thyroid disorders in: mother, MGM. No FH of thyroid cancer. No h/o radiation tx to head or neck.  No seaweed or kelp. No recent contrast studies. No herbal supplements.  + Biotin use but not since last week. No recent steroids use.   Pt. also has a history of Raynauds phenomenon. She is on OCPs - since a teenager. She tried to stop x 8 mo  Last year - restarted 5 mo ago b/c acne and also dysmenorrhea. She also has a history of heart valve insufficiency. She has neck, lower back pain, hip pain; she was diagnosed with spina bifida  occulta. She has constipation (chronic) and anal fissures. She has hyperlipidemia.  She will be getting married next week.Marland Kitchen  She works at [employer].  ROS: Constitutional: + See HPI Eyes: no blurry vision, no xerophthalmia ENT: no sore throat, + see HPI Cardiovascular: no CP/no SOB/no palpitations/no leg swelling Respiratory: no cough/no SOB/no wheezing Gastrointestinal: no N/no V/no D/no C/no acid reflux Musculoskeletal: no muscle aches/no joint aches Skin: no rashes, + hair thinning Neurological: no tremors/no numbness/no tingling/no dizziness, + menstrual migraines  I reviewed pt's medications, allergies, PMH, social hx, family hx, and changes were documented in the history of present illness. Otherwise, unchanged from my initial visit note.  Past Medical History:  Diagnosis Date  . Anal fissure   . IBS (irritable bowel syndrome)   . Migraine headache   . Pelvic floor dysfunction?   . Perforated eardrum    Past Surgical History:  Procedure Laterality Date  . EAR TUBE REMOVAL    . TONSILLECTOMY AND ADENOIDECTOMY    . TYMPANOPLASTY Right   . TYMPANOSTOMY TUBE PLACEMENT     x 3  . WISDOM TOOTH EXTRACTION     Social History   Socioeconomic History  . Marital status: Single    Spouse name: Not on file  .  Number of children: 0  . Years of education: Not on file  . Highest education level: Not on file  Occupational History  . Occupation: Equities trader  Social Needs  . Financial resource strain: Not on file  . Food insecurity    Worry: Not on file    Inability: Not on file  . Transportation needs    Medical: Not on file    Non-medical: Not on file  Tobacco Use  . Smoking status: Never Smoker  . Smokeless tobacco: Never Used  Substance and Sexual Activity  . Alcohol use: Yes    Alcohol/week: 0.0 standard drinks    Comment: occasional-2 per month  . Drug use: No  . Sexual activity: Not on file  Lifestyle  . Physical activity    Days per week: Not  on file    Minutes per session: Not on file  . Stress: Not on file  Relationships  . Social Herbalist on phone: Not on file    Gets together: Not on file    Attends religious service: Not on file    Active member of club or organization: Not on file    Attends meetings of clubs or organizations: Not on file    Relationship status: Not on file  . Intimate partner violence    Fear of current or ex partner: Not on file    Emotionally abused: Not on file    Physically abused: Not on file    Forced sexual activity: Not on file  Other Topics Concern  . Not on file  Social History Narrative   Single, no children    moved here from [place] went to [facility] [place]. Moved here after her parents moved here.   Clinic nurse in [employer]   Current Outpatient Medications on File Prior to Visit  Medication Sig Dispense Refill  . cetirizine (ZYRTEC) 10 MG tablet Take by mouth.    . levonorgestrel-ethinyl estradiol (LUTERA) 0.1-20 MG-MCG tablet     . Multiple Vitamins-Minerals (HAIR SKIN AND NAILS FORMULA PO) Body, Hair, Skin and Nails    . Multiple Vitamins-Minerals (MULTIVITAMIN ADULT PO)     . Selenium 200 MCG CAPS      No current facility-administered medications on file prior to visit.    No Known Allergies Family History  Problem Relation Age of Onset  . Migraines Mother   . Hyperlipidemia Father   . Diverticulitis Father        had to have part of colon removed  . Asthma Brother   . Diabetes Maternal Grandmother     PE: BP 118/80   Pulse (!) 106   Ht 5\' 5"  (1.651 m)   Wt 131 lb (59.4 kg)   SpO2 99%   BMI 21.80 kg/m  Wt Readings from Last 3 Encounters:  03/08/19 131 lb (59.4 kg)  12/16/18 132 lb (59.9 kg)  09/04/18 130 lb (59 kg)   Constitutional: normal weight, in NAD Eyes: PERRLA, EOMI, no exophthalmos ENT: moist mucous membranes, no thyromegaly, no cervical lymphadenopathy Cardiovascular: tachycardia, RR, No  MRG Respiratory: CTA B Gastrointestinal: abdomen soft, NT, ND, BS+ Musculoskeletal: no deformities, strength intact in all 4 Skin: moist, warm, no rashes Neurological: no tremor with outstretched hands, DTR normal in all 4   ASSESSMENT: 1. Hashimoto thyroiditis  2.  Hair loss  PLAN: 1. Hashimoto thyroiditis -Reviewed her TFTs and they have been normal with slightly elevated thyroid antibodies in the past.  Therefore,  she does not require thyroid hormones for now. -At last visit we discussed about her Hashimoto's thyroiditis diagnosis and I explained that this is an autoimmune disorder in which she develops antibodies against her own thyroid.  They bind to the thyroid tissue and cause inflammation and eventually destruction of the gland and hypothyroidism.  As of now, she is euthyroid. - I also explained that thyroid enlargement especially at the beginning of her Hashimoto thyroiditis course is not uncommon, and it has a waxing and waning character.  She is not feeling any neck compression symptoms at the moment -At last visit, we started selenium in an effort to reduce her antibody titer and, subsequently, the thyroid inflammation.  I also advised her about ways to improve her immune system including relaxation, diet, exercise, sleep -At this visit, we will recheck her TFTs and will add TPO antibodies.  If they are still high and not improving, will stop selenium. -We discussed that if she plans for pregnancy, she will need to stop selenium.  Also, states if her TPO antibodies remain positive and the TSH increases above 2.5, she may need to be on levothyroxine.  As of now, she and her husband do not plan for pregnancy in the first 2 years of their marriage.  2.  Hair loss and thinning -Hair loss has resolved, but.  Still standing. -Possibly related to Hashimoto's thyroiditis.  We started selenium to decrease her antibodies but I also advised her to start a B complex.  I did advise her to stop  this 1 week before her thyroid check. -We discussed at last visit that this could also be related to her stopping the OCPs but she restarted them approximately 5 months prior to our last visit.  She had menstrual migraines in the placebo week at last visit (discontinued today) and discussed with OB/GYN to either change her OCPs or take them continuously.  We discussed that I would be in favor to take her current OCP continuously -B12 and D vitamins were normal.  Of note, she does have a history of B12 deficiency and was on IM injections.   - time spent with the patient: 25 minutes, of which >50% was spent in obtaining information about her symptoms, reviewing her previous labs, evaluations, and treatments, counseling her about her conditions (please see the discussed topics above), and developing a plan to further investigate and treat them; she had a number of questions which I addressed.  Component     Latest Ref Rng & Units 08/20/2018 03/08/2019  TSH     0.35 - 4.50 uIU/mL 1.66 1.58  Thyroperoxidase Ab SerPl-aCnc     <9 IU/mL 23 (H) 46 (H)  Triiodothyronine,Free,Serum     2.3 - 4.2 pg/mL  3.9  T4,Free(Direct)     0.60 - 1.60 ng/dL  1.02  Her TFTs are excellent, but the TPO antibodies are higher.  For now, we can continue with selenium, but it is not mandatory.  Philemon Kingdom, MD PhD [facility]

## 2019-03-08 NOTE — Patient Instructions (Addendum)
Please continue: - Selenium 200 mcg daily - B complex or Hair Skin and Nails  Please stop at the lab.  Please come back for a follow-up appointment in 6 months.

## 2019-03-09 LAB — T3, FREE: T3, Free: 3.9 pg/mL (ref 2.3–4.2)

## 2019-03-09 LAB — THYROID PEROXIDASE ANTIBODY: Thyroperoxidase Ab SerPl-aCnc: 46 IU/mL — ABNORMAL HIGH (ref <9)

## 2019-03-09 LAB — TSH: TSH: 1.58 u[IU]/mL (ref 0.35–4.50)

## 2019-03-09 LAB — T4, FREE: Free T4: 1.02 ng/dL (ref 0.60–1.60)

## 2019-03-10 ENCOUNTER — Encounter: Payer: Self-pay | Admitting: Internal Medicine

## 2019-04-02 ENCOUNTER — Telehealth: Payer: 59 | Admitting: Family

## 2019-04-02 DIAGNOSIS — B9689 Other specified bacterial agents as the cause of diseases classified elsewhere: Secondary | ICD-10-CM

## 2019-04-02 DIAGNOSIS — J019 Acute sinusitis, unspecified: Secondary | ICD-10-CM | POA: Diagnosis not present

## 2019-04-02 MED ORDER — AMOXICILLIN-POT CLAVULANATE 875-125 MG PO TABS: 1.0000 | ORAL_TABLET | Freq: Two times a day (BID) | ORAL | 0 refills | Status: DC

## 2019-04-02 NOTE — Progress Notes (Signed)

## 2019-04-27 DIAGNOSIS — R1031 Right lower quadrant pain: Secondary | ICD-10-CM | POA: Diagnosis not present

## 2019-04-27 DIAGNOSIS — N760 Acute vaginitis: Secondary | ICD-10-CM | POA: Diagnosis not present

## 2019-04-27 DIAGNOSIS — Z113 Encounter for screening for infections with a predominantly sexual mode of transmission: Secondary | ICD-10-CM | POA: Diagnosis not present

## 2019-04-27 DIAGNOSIS — N941 Unspecified dyspareunia: Secondary | ICD-10-CM | POA: Diagnosis not present

## 2019-04-27 DIAGNOSIS — R102 Pelvic and perineal pain: Secondary | ICD-10-CM | POA: Diagnosis not present

## 2019-04-27 MED FILL — ONDANSETRON ODT 4 MG TABLET: 4 | 3 days supply | Qty: 10 | Fill #0

## 2019-04-27 MED FILL — NITROFURANTOIN MONO-MCR 100: 100 | 7 days supply | Qty: 14 | Fill #0

## 2019-05-01 ENCOUNTER — Emergency Department (INDEPENDENT_AMBULATORY_CARE_PROVIDER_SITE_OTHER): Payer: 59

## 2019-05-01 ENCOUNTER — Other Ambulatory Visit: Payer: Self-pay

## 2019-05-01 ENCOUNTER — Emergency Department
Admission: EM | Admit: 2019-05-01 | Discharge: 2019-05-01 | Disposition: A | Discharge status: Home / Self Care | Payer: 59 | Source: Home / Self Care | Attending: Family Medicine | Admitting: Family Medicine

## 2019-05-01 DIAGNOSIS — R109 Unspecified abdominal pain: Secondary | ICD-10-CM | POA: Diagnosis not present

## 2019-05-01 DIAGNOSIS — K5909 Other constipation: Secondary | ICD-10-CM | POA: Diagnosis not present

## 2019-05-01 DIAGNOSIS — K5900 Constipation, unspecified: Secondary | ICD-10-CM

## 2019-05-01 DIAGNOSIS — R1031 Right lower quadrant pain: Secondary | ICD-10-CM | POA: Diagnosis not present

## 2019-05-01 LAB — POCT CBC W AUTO DIFF (K'VILLE URGENT CARE)

## 2019-05-01 LAB — POCT URINALYSIS DIP (MANUAL ENTRY)
Bilirubin, UA: NEGATIVE
Blood, UA: NEGATIVE
Glucose, UA: NEGATIVE mg/dL
Ketones, POC UA: NEGATIVE mg/dL
Leukocytes, UA: NEGATIVE
Nitrite, UA: NEGATIVE
Protein Ur, POC: NEGATIVE mg/dL
Spec Grav, UA: 1.02 (ref 1.010–1.025)
Urobilinogen, UA: 0.2 E.U./dL
pH, UA: 7.5 (ref 5.0–8.0)

## 2019-05-01 NOTE — ED Triage Notes (Signed)
Pt c/o intermittent abd pain since Christmas eve. Saw OB on Tues. Nausea since then. Had UA which showed trace blood, cx showed strep b. Currently on nitrofurantoin. Small BM yesterday but went 2 days without before then. Also burping a lot. Has [place] scheduled for 1/15.

## 2019-05-01 NOTE — Discharge Instructions (Addendum)
Recommend taking daily Miralax, approximately 1/4 to 1/2 capful mixed in 4 to 8 ounces of water until bowel movements are regular. Recommend increasing natural fiber in diet.  May also take a daily fiber product such as Citrucel with plenty of fluid.  If symptoms become significantly worse during the night or over the weekend, proceed to the local emergency room.

## 2019-05-01 NOTE — ED Provider Notes (Signed)
[facility]    CSN: [id number] Arrival date & time: 05/01/19  0945      History   Chief Complaint Chief Complaint  Patient presents with  . Abdominal Pain    HPI Annette Parks is a 29 y.o. female.   Ten days ago patient had a sudden brief sharp right lower quadrant pain which recurred the next day.  The sporadic pain has persisted and is worse each morning.  She generally has nausea in the morning without vomiting.  She visited her OB/GYN where she had a normal pelvic exam.  She was started on nitrofurantoin for UTI.  Patient's last menstrual period was 04/17/2019 (exact date).  She has been constipated during the past week although she had a small bowel movement yesterday.  She states that she has been "burping" frequently. She is scheduled for pelvic ultrasound on 05/14/19. Patient has a history of Hashimoto's thyroiditis.  The history is provided by the patient.  Abdominal Pain Pain location:  RLQ Pain quality: sharp   Pain radiates to:  Does not radiate Pain severity:  Moderate Onset quality:  Sudden Duration:  10 days Timing:  Sporadic Progression:  Unchanged Chronicity:  New Context: eating   Context: not awakening from sleep, not recent illness, not sick contacts and not suspicious food intake   Relieved by:  Nothing Worsened by:  Nothing Ineffective treatments:  None tried Associated symptoms: belching and constipation   Associated symptoms: no anorexia, no chest pain, no chills, no cough, no diarrhea, no dysuria, no fatigue, no fever, no flatus, no hematemesis, no hematochezia, no hematuria, no melena, no nausea, no shortness of breath, no vaginal bleeding, no vaginal discharge and no vomiting     Past Medical History:  Diagnosis Date  . Anal fissure   . IBS (irritable bowel syndrome)   . Migraine headache   . Pelvic floor dysfunction?   . Perforated eardrum     Patient Active Problem List   Diagnosis Date Noted  . Hashimoto's thyroiditis  09/04/2018  . Hair loss 09/04/2018  . Hip pain, chronic, left 08/13/2017  . Lower back pain 08/11/2017  . Trochanteric bursitis of left hip 07/31/2017  . Sacroiliac (ligament) sprain 07/31/2017  . Common migraine 05/07/2016  . Eye pain, bilateral 05/07/2016  . Facial pain 05/07/2016  . GAD (generalized anxiety disorder) 03/22/2015  . Neck pain 07/11/2014  . Myalgia 07/11/2014  . Dysesthesia 07/11/2014    Past Surgical History:  Procedure Laterality Date  . EAR TUBE REMOVAL    . TONSILLECTOMY AND ADENOIDECTOMY    . TYMPANOPLASTY Right   . TYMPANOSTOMY TUBE PLACEMENT     x 3  . WISDOM TOOTH EXTRACTION      OB History   No obstetric history on file.      Home Medications    Prior to Admission medications   Medication Sig Start Date End Date Taking? Authorizing Provider  amoxicillin-clavulanate (AUGMENTIN) 875-125 MG tablet Take 1 tablet by mouth 2 (two) times daily. 04/02/19   Kennyth Arnold, FNP  cetirizine (ZYRTEC) 10 MG tablet Take by mouth.    [provider]  levonorgestrel-ethinyl estradiol (LUTERA) 0.1-20 MG-MCG tablet     [provider]  Multiple Vitamins-Minerals (HAIR SKIN AND NAILS FORMULA PO) Body, Hair, Skin and Nails    [provider]  Multiple Vitamins-Minerals (MULTIVITAMIN ADULT PO)     [provider]  Selenium 200 MCG CAPS     [provider]    Family  History Family History  Problem Relation Age of Onset  . Migraines Mother   . Hyperlipidemia Father   . Diverticulitis Father        had to have part of colon removed  . Asthma Brother   . Diabetes Maternal Grandmother     Social History Social History   Tobacco Use  . Smoking status: Never Smoker  . Smokeless tobacco: Never Used  Substance Use Topics  . Alcohol use: Yes    Alcohol/week: 0.0 standard drinks    Comment: occasional-2 per month  . Drug use: No     Allergies   Patient has no known allergies.   Review of Systems Review of  Systems  Constitutional: Negative for chills, fatigue and fever.  Respiratory: Negative for cough and shortness of breath.   Cardiovascular: Negative for chest pain.  Gastrointestinal: Positive for abdominal pain and constipation. Negative for anorexia, diarrhea, flatus, hematemesis, hematochezia, melena, nausea and vomiting.  Genitourinary: Negative for dysuria, hematuria, vaginal bleeding and vaginal discharge.  All other systems reviewed and are negative.    Physical Exam Triage Vital Signs ED Triage Vitals  Enc Vitals Group     BP 05/01/19 1312 (!) 128/93     Pulse Rate 05/01/19 1312 98     Resp 05/01/19 1312 18     Temp 05/01/19 1312 98.1 F (36.7 C)     Temp Source 05/01/19 1312 Oral     SpO2 05/01/19 1312 96 %     Weight --      Height --      Head Circumference --      Peak Flow --      Pain Score 05/01/19 1315 2     Pain Loc --      Pain Edu? --      Excl. in [place]? --    No data found.  Updated Vital Signs BP (!) 128/93 (BP Location: Right Arm)   Pulse 98   Temp 98.1 F (36.7 C) (Oral)   Resp 18   LMP 04/17/2019 (Exact Date)   SpO2 96%   Visual Acuity Right Eye Distance:   Left Eye Distance:   Bilateral Distance:    Right Eye Near:   Left Eye Near:    Bilateral Near:     Physical Exam Vitals and nursing note reviewed.  Constitutional:      General: She is not in acute distress.    Appearance: She is not ill-appearing.  HENT:     Head: Normocephalic.     Mouth/Throat:     Mouth: Mucous membranes are moist.  Eyes:     Pupils: Pupils are equal, round, and reactive to light.  Cardiovascular:     Rate and Rhythm: Normal rate.     Heart sounds: Normal heart sounds.  Pulmonary:     Breath sounds: Normal breath sounds.  Abdominal:     General: Abdomen is flat. Bowel sounds are normal. There is no distension.     Palpations: Abdomen is soft. There is no hepatomegaly or splenomegaly.     Tenderness: There is abdominal tenderness in the right lower  quadrant. There is no right CVA tenderness, left CVA tenderness, guarding or rebound. Negative signs include Murphy's sign and McBurney's sign.    Musculoskeletal:     Right lower leg: No edema.     Left lower leg: No edema.  Lymphadenopathy:     Cervical: No cervical adenopathy.  Skin:    General: Skin is warm and  dry.     Findings: No rash.  Neurological:     Mental Status: She is alert.      UC Treatments / Results  Labs (all labs ordered are listed, but only abnormal results are displayed) Labs Reviewed  POCT CBC W AUTO DIFF ([facility]):  WBC 7.0; LY 29.5; MO 3.1; GR 67.3; Hgb 14.4; Platelets 266   POCT URINALYSIS DIP (MANUAL ENTRY) negative    EKG   Radiology DG Abdomen 1 View  Result Date: 05/01/2019 CLINICAL DATA:  Intermittent abdominal pain. History of IBS. EXAM: ABDOMEN - 1 VIEW COMPARISON:  None. FINDINGS: There are no dilated loops of bowel to suggest obstruction. No supine evidence for free air. Moderate to heavy stool burden. No unexpected calcification. No acute finding in the visualized skeleton. IMPRESSION: Nonobstructive bowel gas pattern.  Moderate to heavy stool burden. Electronically Signed   By: Audie Pinto M.D.   On: 05/01/2019 15:03    Procedures Procedures (including critical care time)  Medications Ordered in UC Medications - No data to display  Initial Impression / Assessment and Plan / UC Course  I have reviewed the triage vital signs and the nursing notes.  Pertinent labs & imaging results that were available during my care of the patient were reviewed by me and considered in my medical decision making (see chart for details).     Normal CBC and urinalysis reassuring.  Note increased stool burden on KUB. Followup with OB/GYN after [place]. Recommend follow-up with PCP and gastroenterologist   Final Clinical Impressions(s) / UC Diagnoses   Final diagnoses:  Right lower quadrant abdominal pain  Other constipation      Discharge Instructions     Recommend taking daily Miralax, approximately 1/4 to 1/2 capful mixed in 4 to 8 ounces of water until bowel movements are regular. Recommend increasing natural fiber in diet.  May also take a daily fiber product such as Citrucel with plenty of fluid.  If symptoms become significantly worse during the night or over the weekend, proceed to the local emergency room.     ED Prescriptions    None        Kandra Nicolas, MD 05/08/19 [phone]

## 2019-05-14 DIAGNOSIS — R102 Pelvic and perineal pain: Secondary | ICD-10-CM | POA: Diagnosis not present

## 2019-05-14 MED FILL — DOXYCYCLINE HYCLATE 100 MG: 100 | 14 days supply | Qty: 28 | Fill #0

## 2019-05-14 MED FILL — LEVONOR-ETH ESTRAD 0.1-0.02: 0.1-20 | 63 days supply | Qty: 84 | Fill #0

## 2019-05-14 MED FILL — AMITRIPTYLINE HCL 10 MG TAB: 10 | 90 days supply | Qty: 90 | Fill #0

## 2019-06-07 DIAGNOSIS — N3010 Interstitial cystitis (chronic) without hematuria: Secondary | ICD-10-CM | POA: Diagnosis not present

## 2019-06-07 DIAGNOSIS — K5900 Constipation, unspecified: Secondary | ICD-10-CM | POA: Diagnosis not present

## 2019-06-07 DIAGNOSIS — N760 Acute vaginitis: Secondary | ICD-10-CM | POA: Diagnosis not present

## 2019-06-07 DIAGNOSIS — N941 Unspecified dyspareunia: Secondary | ICD-10-CM | POA: Diagnosis not present

## 2019-06-07 DIAGNOSIS — R1031 Right lower quadrant pain: Secondary | ICD-10-CM | POA: Diagnosis not present

## 2019-06-07 DIAGNOSIS — N898 Other specified noninflammatory disorders of vagina: Secondary | ICD-10-CM | POA: Diagnosis not present

## 2019-06-07 DIAGNOSIS — Z309 Encounter for contraceptive management, unspecified: Secondary | ICD-10-CM | POA: Diagnosis not present

## 2019-06-07 MED FILL — METRONIDAZOLE 500 MG TABS: 500 | 7 days supply | Qty: 14 | Fill #0

## 2019-06-25 ENCOUNTER — Ambulatory Visit: Payer: 59 | Attending: Obstetrics and Gynecology | Admitting: Physical Therapy

## 2019-06-25 ENCOUNTER — Other Ambulatory Visit: Payer: Self-pay

## 2019-06-25 ENCOUNTER — Encounter: Payer: Self-pay | Admitting: Physical Therapy

## 2019-06-25 DIAGNOSIS — R252 Cramp and spasm: Secondary | ICD-10-CM | POA: Diagnosis not present

## 2019-06-25 DIAGNOSIS — R279 Unspecified lack of coordination: Secondary | ICD-10-CM | POA: Insufficient documentation

## 2019-06-25 NOTE — Patient Instructions (Signed)
STRETCHING THE PELVIC FLOOR MUSCLES NO DILATOR  Supplies . Vaginal lubricant . Mirror (optional) . Gloves (optional) Positioning . Start in a semi-reclined position with your head propped up. Bend your knees and place your thumb or finger at the vaginal opening. Procedure . Apply a moderate amount of lubricant on the outer skin of your vagina, the labia minora.  Apply additional lubricant to your finger. Marland Kitchen Spread the skin away from the vaginal opening. Place the end of your finger at the opening. . Do a maximum contraction of the pelvic floor muscles. Tighten the vagina and the anus maximally and relax. . When you know they are relaxed, gently and slowly insert your finger into your vagina, directing your finger slightly downward, for 2-3 inches of insertion. . Relax and stretch the 6 o'clock position . Hold each stretch for _2 min__ and repeat __1_ time with rest breaks of _1__ seconds between each stretch. . Repeat the stretching in the 4 o'clock and 8 o'clock positions. . Total time should be _6__ minutes, _1__ x per day.  Note the amount of theme your were able to achieve and your tolerance to your finger in your vagina. . Once you have accomplished the techniques you may try them in standing with one foot resting on the tub, or in other positions.  This is a good stretch to do in the shower if you don't need to use lubricant.

## 2019-06-26 NOTE — Therapy (Signed)
[facility] [address] [place], [place], [address] Phone: [phone]   Fax:  [phone]  Physical Therapy Evaluation  Patient Details  Name: Annette Parks MRN: [id number] Date of Birth: 14-Apr-1991 Referring Provider (PT): Tyson Dense, MD   Encounter Date: 06/25/2019  PT End of Session - 06/26/19 1858    Visit Number  1    Date for PT Re-Evaluation  10/15/19    PT Start Time  1102    PT Stop Time  1143    PT Time Calculation (min)  41 min    Activity Tolerance  Patient tolerated treatment well    Behavior During Therapy  [facility] for tasks assessed/performed       Past Medical History:  Diagnosis Date  . Anal fissure   . IBS (irritable bowel syndrome)   . Migraine headache   . Pelvic floor dysfunction?   . Perforated eardrum     Past Surgical History:  Procedure Laterality Date  . EAR TUBE REMOVAL    . TONSILLECTOMY AND ADENOIDECTOMY    . TYMPANOPLASTY Right   . TYMPANOSTOMY TUBE PLACEMENT     x 3  . WISDOM TOOTH EXTRACTION      There were no vitals filed for this visit.   Subjective Assessment - 06/26/19 1936    Subjective  Pt states she has had chronic UTIs for 5-6 years.  Pt has recently gotten married and is having pain with intercourse and gets to 8/10.    Pertinent History  hx of hemorrhoids, chronic constipation, chronic UTIs    Currently in Pain?  No/denies         [facility] PT Assessment - 06/26/19 0001      Assessment   Medical Diagnosis  N94.10 (ICD-10-CM) - Unspecified dyspareunia    Referring Provider (PT)  Tyson Dense, MD      Precautions   Precautions  None      Balance Screen   Has the patient fallen in the past 6 months  No      [place] residence    Living Arrangements  Spouse/significant other      Prior Function   Level of Independence  Independent    Vocation  Full time employment    Loss adjuster, chartered  for neurology      Cognition   Overall Cognitive Status  Within Functional Limits for tasks assessed      Posture/Postural Control   Posture/Postural Control  No significant limitations      ROM / Strength   AROM / PROM / Strength  Strength      Strength   Overall Strength Comments  Lt hip 4/5 abduction and adduction; 4+/5 ext and flexion      Flexibility   Soft Tissue Assessment /Muscle Length  yes    Hamstrings  75% bilateral      Palpation   Palpation comment  tight lumbar, glutes, hamstrings      Ambulation/Gait   Gait Pattern  Within Functional Limits                Objective measurements completed on examination: See above findings.    Pelvic Floor Special Questions - 06/26/19 0001    Prior Pelvic/Prostate Exam  Yes    Are you Pregnant or attempting pregnancy?  No    Prior Pregnancies  No    Currently Sexually Active  Yes    Marinoff  Scale  pain prevents any attempts at intercourse    Urinary Leakage  No    Urinary urgency  No    Urinary frequency  when having bladder symptoms; nocturia 1x    Caffeine beverages  not right now    Falling out feeling (prolapse)  --   pressure around the anus when constipation   Perineal Body/Introitus   Elevated    Pelvic Floor Internal Exam  pt identity confirmed and informed consent given to perform internal sof tissue assessment    Exam Type  Vaginal    Palpation  Lt bulbocavernosis and ischio tight and TTP; levators tight    Strength  fair squeeze, definite lift    Strength # of seconds  7    Tone  high       [facility] Adult PT Treatment/Exercise - 06/26/19 0001      Self-Care   Self-Care  Other Self-Care Comments    Other Self-Care Comments   self massage to stretch pelvic - vaginal canal             PT Education - 06/26/19 1900    Education Details  stretching pelvic floor with no dilator       PT Short Term Goals - 06/26/19 1931      PT SHORT TERM GOAL #1   Title  ind with self massage with or  without dilators    Time  4    Period  Weeks    Status  New    Target Date  07/23/19      PT SHORT TERM GOAL #2   Title  Pt will be in with initial HEP and toileting techniques    Time  4    Period  Weeks    Status  New    Target Date  07/23/19        PT Long Term Goals - 06/26/19 1917      PT LONG TERM GOAL #1   Title  Pt will report No nocturia.    Time  16    Period  Weeks    Status  New    Target Date  10/15/19      PT LONG TERM GOAL #2   Title  Marinoff 1/3 or less    Time  16    Period  Weeks    Status  New    Target Date  10/15/19      PT LONG TERM GOAL #3   Title  Pt will report BM at least 5x/week without excess bearing down    Time  16    Period  Weeks    Status  New    Target Date  10/15/19      PT LONG TERM GOAL #4   Title  ind with advanced HEP    Time  16    Period  Weeks    Status  New    Target Date  10/15/19             Plan - 06/26/19 1900    Clinical Impression Statement  Pt presents to skilled PT with pelvic issues including dyspreunia, nocturia, bladder pain and constipation.  Pt has weakness of left hip.  Pt has tight lumbar, glutes and hamstring Lt>Rt.  Pt has tension and maybe a trigger point Lt bulbocavernosis.  Pt with dyspareunia 3/3 Marinoff.  Pt has pelvic floor strength of 3/5 MMT and can hold for about 7 sec  Pt has high tone  pelvic floor with bulbocavernosis especially restricted.  Pt will benefit from skilled PT to address impairments to help her manage pain and improve her bowel and bladder function as well as quality of life.    Personal Factors and Comorbidities  Time since onset of injury/illness/exacerbation    Examination-Participation Restrictions  Interpersonal Relationship    Stability/Clinical Decision Making  Stable/Uncomplicated    Clinical Decision Making  Low    Rehab Potential  Excellent    PT Frequency  1x / week    PT Duration  Other (comment)   16 weeks   PT Treatment/Interventions  ADLs/Self Care Home  Management;Biofeedback;Cryotherapy;Electrical Stimulation;Moist Heat;Therapeutic activities;Therapeutic exercise;Neuromuscular re-education;Patient/family education;Manual techniques;Dry needling;Taping    PT Next Visit Plan  biofeedback, toileting techniques, f/u on self massage    PT Home Exercise Plan  self massage    Consulted and Agree with Plan of Care  Patient       Patient will benefit from skilled therapeutic intervention in order to improve the following deficits and impairments:  Pain, Increased fascial restricitons, Decreased strength, Decreased coordination, Decreased range of motion, Impaired flexibility, Increased muscle spasms  Visit Diagnosis: Cramp and spasm  Unspecified lack of coordination     Problem List Patient Active Problem List   Diagnosis Date Noted  . Hashimoto's thyroiditis 09/04/2018  . Hair loss 09/04/2018  . Hip pain, chronic, left 08/13/2017  . Lower back pain 08/11/2017  . Trochanteric bursitis of left hip 07/31/2017  . Sacroiliac (ligament) sprain 07/31/2017  . Common migraine 05/07/2016  . Eye pain, bilateral 05/07/2016  . Facial pain 05/07/2016  . GAD (generalized anxiety disorder) 03/22/2015  . Neck pain 07/11/2014  . Myalgia 07/11/2014  . Dysesthesia 07/11/2014    Jule Ser, PT 06/26/2019, 7:42 PM  [facility] [address] [place], [place], [address] Phone: [phone]   Fax:  [phone]  Name: Annette Parks MRN: [id number] Date of Birth: [birth year]

## 2019-07-02 ENCOUNTER — Ambulatory Visit: Payer: 59 | Attending: Obstetrics and Gynecology | Admitting: Physical Therapy

## 2019-07-02 ENCOUNTER — Other Ambulatory Visit: Payer: Self-pay

## 2019-07-02 DIAGNOSIS — R252 Cramp and spasm: Secondary | ICD-10-CM | POA: Diagnosis not present

## 2019-07-02 DIAGNOSIS — R279 Unspecified lack of coordination: Secondary | ICD-10-CM

## 2019-07-02 NOTE — Patient Instructions (Addendum)
Toileting Techniques for Bowel Movements (Defecation) Using your belly (abdomen) and pelvic floor muscles to have a bowel movement is usually instinctive.  Sometimes people can have problems with these muscles and have to relearn proper defecation (emptying) techniques.  If you have weakness in your muscles, organs that are falling out, decreased sensation in your pelvis, or ignore your urge to go, you may find yourself straining to have a bowel movement.  You are straining if you are: . holding your breath or taking in a huge gulp of air and holding it  . keeping your lips and jaw tensed and closed tightly . turning red in the face because of excessive pushing or forcing . developing or worsening your  hemorrhoids . getting faint while pushing . not emptying completely and have to defecate many times a day  If you are straining, you are actually making it harder for yourself to have a bowel movement.  Many people find they are pulling up with the pelvic floor muscles and closing off instead of opening the anus. Due to lack pelvic floor relaxation and coordination the abdominal muscles, one has to work harder to push the feces out.  Many people have never been taught how to defecate efficiently and effectively.  Notice what happens to your body when you are having a bowel movement.  While you are sitting on the toilet pay attention to the following areas: . Jaw and mouth position . Angle of your hips   . Whether your feet touch the ground or not . Arm placement  . Spine position . Waist . Belly tension . Anus (opening of the anal canal)  An Evacuation/Defecation Plan   Here are the 4 basic points:  1. Lean forward enough for your elbows to rest on your knees 2. Support your feet on the floor or use a low stool if your feet don't touch the floor  3. Push out your belly as if you have swallowed a beach ball-you should feel a widening of your waist 4. Open and relax your pelvic floor muscles,  rather than tightening around the anus      The following conditions my require modifications to your toileting posture:  . If you have had surgery in the past that limits your back, hip, pelvic, knee or ankle flexibility . Constipation   Your healthcare practitioner may make the following additional suggestions and adjustments:  1) Sit on the toilet  a) Make sure your feet are supported. b) Notice your hip angle and spine position-most people find it effective to lean forward or raise their knees, which can help the muscles around the anus to relax  c) When you lean forward, place your forearms on your thighs for support  2) Relax suggestions a) Breath deeply in through your nose and out slowly through your mouth as if you are smelling the flowers and blowing out the candles. b) To become aware of how to relax your muscles, contracting and releasing muscles can be helpful.  Pull your pelvic floor muscles in tightly by using the image of holding back gas, or closing around the anus (visualize making a circle smaller) and lifting the anus up and in.  Then release the muscles and your anus should drop down and feel open. Repeat 5 times ending with the feeling of relaxation. c) Keep your pelvic floor muscles relaxed; let your belly bulge out. d) The digestive tract starts at the mouth and ends at the anal opening, so be   sure to relax both ends of the tube.  Place your tongue on the roof of your mouth with your teeth separated.  This helps relax your mouth and will help to relax the anus at the same time.  3) Empty (defecation) a) Keep your pelvic floor and sphincter relaxed, then bulge your anal muscles.  Make the anal opening wide.  b) Stick your belly out as if you have swallowed a beach ball. c) Make your belly wall hard using your belly muscles while continuing to breathe. Doing this makes it easier to open your anus. d) Breath out and give a grunt (or try using other sounds such as  ahhhh, shhhhh, ohhhh or grrrrrrr).  4) Finish a) As you finish your bowel movement, pull the pelvic floor muscles up and in.  This will leave your anus in the proper place rather than remaining pushed out and down. If you leave your anus pushed out and down, it will start to feel as though that is normal and give you incorrect signals about needing to have a bowel movement.   [facility] [address] [place], [place] [address] Access Code: [email or web address]  URL: [email or web address]  Date: 07/02/2019  Prepared by: Jari Favre   Exercises Seated Hamstring Stretch - 3 reps - 1 sets - 30 sec hold - 1x daily - 7x weekly Standing Hamstring Stretch with Step - 3 reps - 1 sets - 30 sec hold - 1x daily - 7x weekly Seated Piriformis Stretch with Trunk Bend - 3 reps - 1 sets - 30 sec hold - 1x daily - 7x weekly

## 2019-07-02 NOTE — Therapy (Signed)
[facility] [address] [place], [place], [address] Phone: [phone]   Fax:  [phone]  Physical Therapy Treatment  Patient Details  Name: Annette Parks MRN: [id number] Date of Birth: 1990/10/20 Referring Provider (PT): Tyson Dense, MD   Encounter Date: 07/02/2019  PT End of Session - 07/02/19 1024    Visit Number  2    Date for PT Re-Evaluation  10/15/19    PT Start Time  1016    PT Stop Time  1102    PT Time Calculation (min)  46 min    Activity Tolerance  Patient tolerated treatment well    Behavior During Therapy  [facility] for tasks assessed/performed       Past Medical History:  Diagnosis Date  . Anal fissure   . IBS (irritable bowel syndrome)   . Migraine headache   . Pelvic floor dysfunction?   . Perforated eardrum     Past Surgical History:  Procedure Laterality Date  . EAR TUBE REMOVAL    . TONSILLECTOMY AND ADENOIDECTOMY    . TYMPANOPLASTY Right   . TYMPANOSTOMY TUBE PLACEMENT     x 3  . WISDOM TOOTH EXTRACTION      There were no vitals filed for this visit.  Subjective Assessment - 07/02/19 1024    Subjective  Pt states no pain currently.    Currently in Pain?  No/denies                       [facility] Adult PT Treatment/Exercise - 07/02/19 0001      Self-Care   Other Self-Care Comments   toileting technique used with biofeedback to demonstrate position that relaxes muscles      Neuro Re-ed    Neuro Re-ed Details   biofeedback with stretch and kegel in sitting and standing- imporved contraction after resting tone lowered and able to get better contraction      Exercises   Exercises  Lumbar      Lumbar Exercises: Stretches   Active Hamstring Stretch  Right;Left;4 reps;10 seconds    Lower Trunk Rotation  3 reps;20 seconds    Prone on Elbows Stretch  1 rep;30 seconds    Figure 4 Stretch  3 reps;30 seconds      Lumbar Exercises: Seated   Other Seated Lumbar  Exercises  ball circles; breathing into belly      Manual Therapy   Manual Therapy  Soft tissue mobilization    Soft tissue mobilization  lumbar paraspinals       Trigger Point Dry Needling - 07/02/19 0001    Consent Given?  Yes    Education Handout Provided  --   verbally reviewed info   Muscles Treated Back/Hip  Lumbar multifidi    Lumbar multifidi Response  Palpable increased muscle length           PT Education - 07/02/19 1109    Education Details  toilet techiques and medbridge       PT Short Term Goals - 06/26/19 1931      PT SHORT TERM GOAL #1   Title  ind with self massage with or without dilators    Time  4    Period  Weeks    Status  New    Target Date  07/23/19      PT SHORT TERM GOAL #2   Title  Pt will be in with initial HEP and toileting techniques  Time  4    Period  Weeks    Status  New    Target Date  07/23/19        PT Long Term Goals - 06/26/19 1917      PT LONG TERM GOAL #1   Title  Pt will report No nocturia.    Time  16    Period  Weeks    Status  New    Target Date  10/15/19      PT LONG TERM GOAL #2   Title  Marinoff 1/3 or less    Time  16    Period  Weeks    Status  New    Target Date  10/15/19      PT LONG TERM GOAL #3   Title  Pt will report BM at least 5x/week without excess bearing down    Time  16    Period  Weeks    Status  New    Target Date  10/15/19      PT LONG TERM GOAL #4   Title  ind with advanced HEP    Time  16    Period  Weeks    Status  New    Target Date  10/15/19            Plan - 07/02/19 1110    Clinical Impression Statement  Pt responded well to stretches with resting tone from 42m to 2-351mafter.  Pt had increased muscle length with dry needling . No goals met todya due to initial treatment after eval.  She was given instructions and demo of toileting techniques.    PT Treatment/Interventions  ADLs/Self Care Home Management;Biofeedback;Cryotherapy;Electrical Stimulation;Moist  Heat;Therapeutic activities;Therapeutic exercise;Neuromuscular re-education;Patient/family education;Manual techniques;Dry needling;Taping    PT Next Visit Plan  internal STM if tolerated, breathing and bulging seated on ball; f/u on stretches    PT Home Exercise Plan  Access Code: [email or web address] toilet techniques    Consulted and Agree with Plan of Care  Patient       Patient will benefit from skilled therapeutic intervention in order to improve the following deficits and impairments:  Pain, Increased fascial restricitons, Decreased strength, Decreased coordination, Decreased range of motion, Impaired flexibility, Increased muscle spasms  Visit Diagnosis: Cramp and spasm  Unspecified lack of coordination     Problem List Patient Active Problem List   Diagnosis Date Noted  . Hashimoto's thyroiditis 09/04/2018  . Hair loss 09/04/2018  . Hip pain, chronic, left 08/13/2017  . Lower back pain 08/11/2017  . Trochanteric bursitis of left hip 07/31/2017  . Sacroiliac (ligament) sprain 07/31/2017  . Common migraine 05/07/2016  . Eye pain, bilateral 05/07/2016  . Facial pain 05/07/2016  . GAD (generalized anxiety disorder) 03/22/2015  . Neck pain 07/11/2014  . Myalgia 07/11/2014  . Dysesthesia 07/11/2014    JaJule SerPT 07/02/2019, 12:01 PM  [facility] [address] [place]: [phone] Fax:  [phone]: Annette NELLRN: [id number] of Birth: [birth year]

## 2019-07-09 ENCOUNTER — Other Ambulatory Visit: Payer: Self-pay

## 2019-07-09 ENCOUNTER — Ambulatory Visit: Payer: 59 | Admitting: Physical Therapy

## 2019-07-09 DIAGNOSIS — R252 Cramp and spasm: Secondary | ICD-10-CM | POA: Diagnosis not present

## 2019-07-09 DIAGNOSIS — R279 Unspecified lack of coordination: Secondary | ICD-10-CM

## 2019-07-09 NOTE — Therapy (Signed)
[facility] [address], [place], [place], [address] Phone: [phone]   Fax:  [phone]  Physical Therapy Treatment  Patient Details  Name: Annette Parks MRN: [id number] Date of Birth: Jan 10, 1991 Referring Provider (PT): Tyson Dense, MD   Encounter Date: 07/09/2019  PT End of Session - 07/09/19 1107    Visit Number  3    Date for PT Re-Evaluation  10/15/19    PT Start Time  0931    PT Stop Time  1011    PT Time Calculation (min)  40 min    Activity Tolerance  Patient tolerated treatment well    Behavior During Therapy  [facility] for tasks assessed/performed       Past Medical History:  Diagnosis Date  . Anal fissure   . IBS (irritable bowel syndrome)   . Migraine headache   . Pelvic floor dysfunction?   . Perforated eardrum     Past Surgical History:  Procedure Laterality Date  . EAR TUBE REMOVAL    . TONSILLECTOMY AND ADENOIDECTOMY    . TYMPANOPLASTY Right   . TYMPANOSTOMY TUBE PLACEMENT     x 3  . WISDOM TOOTH EXTRACTION      There were no vitals filed for this visit.  Subjective Assessment - 07/09/19 1108    Subjective  I have been stretching a lot and that is feeling better.    Currently in Pain?  No/denies                       [facility] Adult PT Treatment/Exercise - 07/09/19 0001      Neuro Re-ed    Neuro Re-ed Details   sitting on ball and chair with towel for tactile cues - circles, tilts and breathing and bulging      Manual Therapy   Manual Therapy  Soft tissue mobilization;Internal Pelvic Floor    Manual therapy comments  pt identity confirmed and consent given  to perform internal STM    Soft tissue mobilization  lumbar paraspinals; thoracic paraspinals    Internal Pelvic Floor  bulbospongeosis and transverse peroneus to Lt>Rt             PT Education - 07/09/19 1014    Education Details  Access Code: [email or web address]    Person(s) Educated  Patient    Methods   Explanation;Demonstration;Handout;Verbal cues    Comprehension  Verbalized understanding;Returned demonstration       PT Short Term Goals - 06/26/19 1931      PT SHORT TERM GOAL #1   Title  ind with self massage with or without dilators    Time  4    Period  Weeks    Status  New    Target Date  07/23/19      PT SHORT TERM GOAL #2   Title  Pt will be in with initial HEP and toileting techniques    Time  4    Period  Weeks    Status  New    Target Date  07/23/19        PT Long Term Goals - 06/26/19 1917      PT LONG TERM GOAL #1   Title  Pt will report No nocturia.    Time  16    Period  Weeks    Status  New    Target Date  10/15/19      PT LONG TERM GOAL #2   Title  Marinoff 1/3 or less    Time  16    Period  Weeks    Status  New    Target Date  10/15/19      PT LONG TERM GOAL #3   Title  Pt will report BM at least 5x/week without excess bearing down    Time  16    Period  Weeks    Status  New    Target Date  10/15/19      PT LONG TERM GOAL #4   Title  ind with advanced HEP    Time  16    Period  Weeks    Status  New    Target Date  10/15/19            Plan - 07/09/19 1014    Clinical Impression Statement  Pt had release of tension with STM today around bulbospongeosis and Lt trasverse peroneus as well as throughout spinal erectors.  Pt was able to feel bulge with breathing.  She will continue to benefit from soft tissue and muscle lengthening techniques.    Personal Factors and Comorbidities  Time since onset of injury/illness/exacerbation    PT Treatment/Interventions  ADLs/Self Care Home Management;Biofeedback;Cryotherapy;Electrical Stimulation;Moist Heat;Therapeutic activities;Therapeutic exercise;Neuromuscular re-education;Patient/family education;Manual techniques;Dry needling;Taping    PT Next Visit Plan  internal STM working into levators and OI if tolerated, prone press ups,lumbar flex and ext on ball    PT Home Exercise Plan  Access Code:  [email or web address] and toilet techniques    Consulted and Agree with Plan of Care  Patient       Patient will benefit from skilled therapeutic intervention in order to improve the following deficits and impairments:  Pain, Increased fascial restricitons, Decreased strength, Decreased coordination, Decreased range of motion, Impaired flexibility, Increased muscle spasms  Visit Diagnosis: Cramp and spasm  Unspecified lack of coordination     Problem List Patient Active Problem List   Diagnosis Date Noted  . Hashimoto's thyroiditis 09/04/2018  . Hair loss 09/04/2018  . Hip pain, chronic, left 08/13/2017  . Lower back pain 08/11/2017  . Trochanteric bursitis of left hip 07/31/2017  . Sacroiliac (ligament) sprain 07/31/2017  . Common migraine 05/07/2016  . Eye pain, bilateral 05/07/2016  . Facial pain 05/07/2016  . GAD (generalized anxiety disorder) 03/22/2015  . Neck pain 07/11/2014  . Myalgia 07/11/2014  . Dysesthesia 07/11/2014    Jule Ser, PT 07/09/2019, 11:35 AM  [facility] [address], [place], [place], [address] Phone: [phone]   Fax:  [phone]  Name: Annette Parks MRN: [id number] Date of Birth: 09/21/1990

## 2019-07-09 NOTE — Patient Instructions (Signed)
Access Code: [email or web address] URL: [email or web address] Date: 07/09/2019 Prepared by: Jari Favre  Exercises Seated Hamstring Stretch - 3 reps - 1 sets - 30 sec hold - 1x daily - 7x weekly Standing Hamstring Stretch with Step - 3 reps - 1 sets - 30 sec hold - 1x daily - 7x weekly Seated Piriformis Stretch with Trunk Bend - 3 reps - 1 sets - 30 sec hold - 1x daily - 7x weekly Diaphragmatic Breathing in Supported Child's Pose with Pelvic Floor Relaxation - 10 reps - 3 sets - 1x daily - 7x weekly

## 2019-07-21 MED FILL — LEVONOR-ETH ESTRAD 0.1-0.02: 0.1-20 | 63 days supply | Qty: 84 | Fill #1

## 2019-07-23 ENCOUNTER — Other Ambulatory Visit: Payer: Self-pay

## 2019-07-23 ENCOUNTER — Encounter: Payer: Self-pay | Admitting: Physical Therapy

## 2019-07-23 ENCOUNTER — Ambulatory Visit: Payer: 59 | Admitting: Physical Therapy

## 2019-07-23 DIAGNOSIS — R252 Cramp and spasm: Secondary | ICD-10-CM

## 2019-07-23 DIAGNOSIS — R279 Unspecified lack of coordination: Secondary | ICD-10-CM | POA: Diagnosis not present

## 2019-07-23 NOTE — Therapy (Signed)
[facility] [address], [place], [place], [address] Phone: [phone]   Fax:  [phone]  Physical Therapy Treatment  Patient Details  Name: Annette Parks MRN: [id number] Date of Birth: Oct 08, 1990 Referring Provider (PT): Tyson Dense, MD   Encounter Date: 07/23/2019  PT End of Session - 07/23/19 1141    Visit Number  4    Date for PT Re-Evaluation  10/15/19    PT Start Time  1100    PT Stop Time  1140    PT Time Calculation (min)  40 min    Activity Tolerance  Patient tolerated treatment well    Behavior During Therapy  [facility] for tasks assessed/performed       Past Medical History:  Diagnosis Date  . Anal fissure   . IBS (irritable bowel syndrome)   . Migraine headache   . Pelvic floor dysfunction?   . Perforated eardrum     Past Surgical History:  Procedure Laterality Date  . EAR TUBE REMOVAL    . TONSILLECTOMY AND ADENOIDECTOMY    . TYMPANOPLASTY Right   . TYMPANOSTOMY TUBE PLACEMENT     x 3  . WISDOM TOOTH EXTRACTION      There were no vitals filed for this visit.  Subjective Assessment - 07/23/19 1144    Subjective  I had intercourse without pain and the stretches are helping.  I have been getting more sciatic pain on the Lt side.    Currently in Pain?  No/denies                       [facility] Adult PT Treatment/Exercise - 07/23/19 0001      Neuro Re-ed    Neuro Re-ed Details   cues to activate  TrA - needs more cues on Lt side      Lumbar Exercises: Standing   Shoulder Extension  Strengthening;Both;20 reps;Theraband    Theraband Level (Shoulder Extension)  Level 2 (Red)    Other Standing Lumbar Exercises  isometric flexion - red band - fwd step 20x       Lumbar Exercises: Supine   Ab Set  10 reps    Bent Knee Raise  20 reps    Dead Bug Limitations  LE slow lowering alternating LE - on foam roller    Large Ball Oblique Isometric Limitations  isometric obliques  with red band - 10x each side             PT Education - 07/23/19 1140    Education Details  Access Code: [email or web address]    Person(s) Educated  Patient    Methods  Explanation;Demonstration;Verbal cues;Handout    Comprehension  Verbalized understanding;Returned demonstration       PT Short Term Goals - 06/26/19 1931      PT SHORT TERM GOAL #1   Title  ind with self massage with or without dilators    Time  4    Period  Weeks    Status  New    Target Date  07/23/19      PT SHORT TERM GOAL #2   Title  Pt will be in with initial HEP and toileting techniques    Time  4    Period  Weeks    Status  New    Target Date  07/23/19        PT Long Term Goals - 07/23/19 1103      PT LONG TERM  GOAL #1   Title  Pt will report No nocturia.    Baseline  1x/ nocutria      PT LONG TERM GOAL #2   Title  Marinoff 1/3 or less    Baseline  has intercourse and no pain    Status  Partially Met      PT LONG TERM GOAL #3   Title  Pt will report BM at least 5x/week without excess bearing down            Plan - 07/23/19 1142    Clinical Impression Statement  Pt did well with basic core strength.  Overall, she has met and made progress towards several goals.  She is still getting some sciatic pain along with stretches and will benefit from core strengthening    Personal Factors and Comorbidities  Time since onset of injury/illness/exacerbation    PT Treatment/Interventions  ADLs/Self Care Home Management;Biofeedback;Cryotherapy;Electrical Stimulation;Moist Heat;Therapeutic activities;Therapeutic exercise;Neuromuscular re-education;Patient/family education;Manual techniques;Dry needling;Taping    PT Next Visit Plan  core strength - add quadruped and side plank on knees    Consulted and Agree with Plan of Care  Patient       Patient will benefit from skilled therapeutic intervention in order to improve the following deficits and impairments:  Pain, Increased fascial restricitons,  Decreased strength, Decreased coordination, Decreased range of motion, Impaired flexibility, Increased muscle spasms  Visit Diagnosis: Cramp and spasm  Unspecified lack of coordination     Problem List Patient Active Problem List   Diagnosis Date Noted  . Hashimoto's thyroiditis 09/04/2018  . Hair loss 09/04/2018  . Hip pain, chronic, left 08/13/2017  . Lower back pain 08/11/2017  . Trochanteric bursitis of left hip 07/31/2017  . Sacroiliac (ligament) sprain 07/31/2017  . Common migraine 05/07/2016  . Eye pain, bilateral 05/07/2016  . Facial pain 05/07/2016  . GAD (generalized anxiety disorder) 03/22/2015  . Neck pain 07/11/2014  . Myalgia 07/11/2014  . Dysesthesia 07/11/2014    Jule Ser, PT 07/23/2019, 12:01 PM  [facility] [address], [place], [place], [address] Phone: [phone]   Fax:  [phone]  Name: Annette Parks MRN: [id number] Date of Birth: [birth year]

## 2019-07-23 NOTE — Patient Instructions (Signed)
Access Code: [email or web address] URL: [email or web address] Date: 07/23/2019 Prepared by: Jari Favre  Exercises Seated Hamstring Stretch - 1 x daily - 7 x weekly - 3 reps - 1 sets - 30 sec hold Standing Hamstring Stretch with Step - 1 x daily - 7 x weekly - 3 reps - 1 sets - 30 sec hold Seated Piriformis Stretch with Trunk Bend - 1 x daily - 7 x weekly - 3 reps - 1 sets - 30 sec hold Diaphragmatic Breathing in Supported Child's Pose with Pelvic Floor Relaxation - 1 x daily - 7 x weekly - 10 reps - 3 sets Shoulder Extension with Resistance - 1 x daily - 7 x weekly - 10 reps - 3 sets Supine Transversus Abdominis Bracing - Hands on Thighs - 1 x daily - 7 x weekly - 1 sets - 10 reps - 5 sec hold

## 2019-07-30 ENCOUNTER — Emergency Department (INDEPENDENT_AMBULATORY_CARE_PROVIDER_SITE_OTHER)
Admission: EM | Admit: 2019-07-30 | Discharge: 2019-07-30 | Disposition: A | Discharge status: Home / Self Care | Payer: 59 | Source: Home / Self Care

## 2019-07-30 ENCOUNTER — Other Ambulatory Visit: Payer: Self-pay

## 2019-07-30 DIAGNOSIS — S71111A Laceration without foreign body, right thigh, initial encounter: Secondary | ICD-10-CM | POA: Diagnosis not present

## 2019-07-30 NOTE — ED Triage Notes (Signed)
Pt c/o laceration to upper RT leg when she dropped the blade to her blender, slicing her thigh. Wrapped with steri strips. Pain 2/10

## 2019-07-30 NOTE — Discharge Instructions (Signed)
  You should keep bandage in place for 24 hours and try to avoid heavy lifting, jumping, squats, or other activities that could add extra pressure to the laceration over the next 48 hours.    After 24 hours you can gently remove the bandage and clean wound with warm water and mild soap. Pat dry. Do not soak your wound as the glue and steri-strips may fall off too soon.  Follow up in 5-7 days if wound not healing, sooner if concern for infection- increased pain, redness, drainage of pus, swelling.

## 2019-07-30 NOTE — ED Provider Notes (Signed)
[facility]    CSN: [id number] Arrival date & time: 07/30/19  1152      History   Chief Complaint Chief Complaint  Patient presents with  . Laceration    RT leg    HPI Annette Parks is a 29 y.o. female.   HPI Annette Parks is a 29 y.o. female presenting to UC with c/o laceration to her Right thigh that occurred about 1 hour PTA. Pt dropped the blade to her new blender while taking it out of the packaging.  She attempted to catch it but cut her thigh. She applied 2 steri-strips at home and an ace bandage. Pain is 2/10. Bleeding controlled PTA. Last tetanus 2019.     Past Medical History:  Diagnosis Date  . Anal fissure   . IBS (irritable bowel syndrome)   . Migraine headache   . Pelvic floor dysfunction?   . Perforated eardrum     Patient Active Problem List   Diagnosis Date Noted  . Hashimoto's thyroiditis 09/04/2018  . Hair loss 09/04/2018  . Hip pain, chronic, left 08/13/2017  . Lower back pain 08/11/2017  . Trochanteric bursitis of left hip 07/31/2017  . Sacroiliac (ligament) sprain 07/31/2017  . Common migraine 05/07/2016  . Eye pain, bilateral 05/07/2016  . Facial pain 05/07/2016  . GAD (generalized anxiety disorder) 03/22/2015  . Neck pain 07/11/2014  . Myalgia 07/11/2014  . Dysesthesia 07/11/2014    Past Surgical History:  Procedure Laterality Date  . EAR TUBE REMOVAL    . TONSILLECTOMY AND ADENOIDECTOMY    . TYMPANOPLASTY Right   . TYMPANOSTOMY TUBE PLACEMENT     x 3  . WISDOM TOOTH EXTRACTION      OB History   No obstetric history on file.      Home Medications    Prior to Admission medications   Medication Sig Start Date End Date Taking? Authorizing Provider  amitriptyline (ELAVIL) 10 MG tablet amitriptyline 10 mg tablet    [provider]  amoxicillin-clavulanate (AUGMENTIN) 875-125 MG tablet Take 1 tablet by mouth 2 (two) times daily. 04/02/19   Kennyth Arnold, FNP  cetirizine (ZYRTEC) 10 MG tablet Take by mouth.     [provider]  levonorgestrel-ethinyl estradiol (LUTERA) 0.1-20 MG-MCG tablet     [provider]  Multiple Vitamins-Minerals (HAIR SKIN AND NAILS FORMULA PO) Body, Hair, Skin and Nails    [provider]  Multiple Vitamins-Minerals (MULTIVITAMIN ADULT PO)     [provider]  Selenium 200 MCG CAPS     [provider]    Family History Family History  Problem Relation Age of Onset  . Migraines Mother   . Hyperlipidemia Father   . Diverticulitis Father        had to have part of colon removed  . Asthma Brother   . Diabetes Maternal Grandmother     Social History Social History   Tobacco Use  . Smoking status: Never Smoker  . Smokeless tobacco: Never Used  Substance Use Topics  . Alcohol use: Yes    Alcohol/week: 0.0 standard drinks    Comment: occasional-2 per month  . Drug use: No     Allergies   Patient has no known allergies.   Review of Systems Review of Systems  Musculoskeletal: Negative for gait problem.  Skin: Positive for wound. Negative for color change.  Neurological: Negative for weakness and numbness.     Physical Exam Triage Vital Signs ED Triage Vitals  Enc Vitals Group     BP 07/30/19 1203 (!) 127/93     Pulse Rate 07/30/19 1203 (!) 109     Resp 07/30/19 1203 18     Temp 07/30/19 1203 99.2 F (37.3 C)     Temp Source 07/30/19 1203 Oral     SpO2 07/30/19 1203 100 %     Weight --      Height --      Head Circumference --      Peak Flow --      Pain Score 07/30/19 1205 2     Pain Loc --      Pain Edu? --      Excl. in [place]? --    No data found.  Updated Vital Signs BP (!) 127/93 (BP Location: Left Arm)   Pulse (!) 109   Temp 99.2 F (37.3 C) (Oral)   Resp 18   SpO2 100%   Visual Acuity Right Eye Distance:   Left Eye Distance:   Bilateral Distance:    Right Eye Near:   Left Eye Near:    Bilateral Near:     Physical Exam Vitals and nursing note reviewed.  Constitutional:       Appearance: Normal appearance. She is well-developed.  HENT:     Head: Normocephalic and atraumatic.  Cardiovascular:     Rate and Rhythm: Normal rate.  Pulmonary:     Effort: Pulmonary effort is normal.  Musculoskeletal:        General: No swelling or tenderness. Normal range of motion.     Cervical back: Normal range of motion.     Comments: Right leg: full ROM  Skin:    General: Skin is warm and dry.     Findings: Laceration present. No bruising.       Neurological:     Mental Status: She is alert and oriented to person, place, and time.  Psychiatric:        Behavior: Behavior normal.      UC Treatments / Results  Labs (all labs ordered are listed, but only abnormal results are displayed) Labs Reviewed - No data to display  EKG   Radiology No results found.  Procedures Laceration Repair  Date/Time: 07/30/2019 12:40 PM Performed by: Noe Gens, PA-C Authorized by: Noe Gens, PA-C   Consent:    Consent obtained:  Verbal   Consent given by:  Patient   Risks discussed:  Infection, poor cosmetic result, pain, poor wound healing and need for additional repair   Alternatives discussed:  No treatment Anesthesia (see MAR for exact dosages):    Anesthesia method:  None Laceration details:    Location:  Leg   Leg location:  R upper leg   Length (cm):  4   Depth (mm):  2 Repair type:    Repair type:  Simple Pre-procedure details:    Preparation:  Patient was prepped and draped in usual sterile fashion Exploration:    Hemostasis achieved with:  Direct pressure   Wound exploration: wound explored through full range of motion and entire depth of wound probed and visualized     Wound extent: no areolar tissue violation noted, no fascia violation noted, no foreign bodies/material noted, no muscle damage noted, no nerve damage noted, no tendon damage noted, no underlying fracture noted and no vascular damage noted     Contaminated: no   Treatment:    Area  cleansed with:  Saline   Amount of cleaning:  Standard Skin repair:    Repair method:  Steri-Strips and tissue adhesive   Number of Steri-Strips:  3 Approximation:    Approximation:  Close Post-procedure details:    Dressing:  Non-adherent dressing   Patient tolerance of procedure:  Tolerated well, no immediate complications   (including critical care time)  Medications Ordered in UC Medications - No data to display  Initial Impression / Assessment and Plan / UC Course  I have reviewed the triage vital signs and the nursing notes.  Pertinent labs & imaging results that were available during my care of the patient were reviewed by me and considered in my medical decision making (see chart for details).     Uncomplicated laceration to anterior right thigh closed as noted above. Tetanus UTD AVS provided  Final Clinical Impressions(s) / UC Diagnoses   Final diagnoses:  Laceration of right thigh, initial encounter     Discharge Instructions      You should keep bandage in place for 24 hours and try to avoid heavy lifting, jumping, squats, or other activities that could add extra pressure to the laceration over the next 48 hours.    After 24 hours you can gently remove the bandage and clean wound with warm water and mild soap. Pat dry. Do not soak your wound as the glue and steri-strips may fall off too soon.  Follow up in 5-7 days if wound not healing, sooner if concern for infection- increased pain, redness, drainage of pus, swelling.     ED Prescriptions    None     PDMP not reviewed this encounter.   Noe Gens, [place] 07/30/19 1242

## 2019-08-03 ENCOUNTER — Encounter: Payer: Self-pay | Admitting: Family Medicine

## 2019-08-06 ENCOUNTER — Encounter: Payer: 59 | Admitting: Physical Therapy

## 2019-08-09 MED FILL — AMITRIPTYLINE HCL 10 MG TAB: 10 | 90 days supply | Qty: 90 | Fill #1

## 2019-08-20 ENCOUNTER — Ambulatory Visit: Payer: 59 | Attending: Obstetrics and Gynecology | Admitting: Physical Therapy

## 2019-08-20 ENCOUNTER — Other Ambulatory Visit: Payer: Self-pay

## 2019-08-20 DIAGNOSIS — R252 Cramp and spasm: Secondary | ICD-10-CM | POA: Insufficient documentation

## 2019-08-20 DIAGNOSIS — R279 Unspecified lack of coordination: Secondary | ICD-10-CM | POA: Insufficient documentation

## 2019-08-20 NOTE — Therapy (Signed)
[facility] [address] [place], [place], [address] Phone: [phone]   Fax:  [phone]  Physical Therapy Treatment  Patient Details  Name: MATALYN NAWAZ MRN: [id number] Date of Birth: 1990-08-21 Referring Provider (PT): Tyson Dense, MD   Encounter Date: 08/20/2019  PT End of Session - 08/20/19 1020    Visit Number  5    Date for PT Re-Evaluation  10/15/19    PT Start Time  1018    PT Stop Time  1059    PT Time Calculation (min)  41 min    Activity Tolerance  Patient tolerated treatment well    Behavior During Therapy  [facility] for tasks assessed/performed       Past Medical History:  Diagnosis Date  . Anal fissure   . IBS (irritable bowel syndrome)   . Migraine headache   . Pelvic floor dysfunction?   . Perforated eardrum     Past Surgical History:  Procedure Laterality Date  . EAR TUBE REMOVAL    . TONSILLECTOMY AND ADENOIDECTOMY    . TYMPANOPLASTY Right   . TYMPANOSTOMY TUBE PLACEMENT     x 3  . WISDOM TOOTH EXTRACTION      There were no vitals filed for this visit.  Subjective Assessment - 08/20/19 1024    Subjective  I have had more irritation but I think because I wasn't doing the exercises.  I cut my leg really bad and couldn't do the exercises so that's why.  I haven't been able to cross my legs at work and that has helped the low back pain feel better.    Currently in Pain?  No/denies                       [facility] Adult PT Treatment/Exercise - 08/20/19 0001      Lumbar Exercises: Stretches   Active Hamstring Stretch  Right;Left;1 rep;30 seconds    Hip Flexor Stretch  Right;Left;1 rep;20 seconds      Lumbar Exercises: Standing   Functional Squats  20 reps   anti-rotation with band   Forward Lunge Limitations  sliders 3 ways - 5 each side    Other Standing Lumbar Exercises  staggared stance - press out for anti-rotation - 10x each way      Lumbar Exercises: Seated    Hip Flexion on Ball  Strengthening;Both;20 reps   with alt UE 3lb     Lumbar Exercises: Quadruped   Opposite Arm/Leg Raise  Right arm/Left leg;Left arm/Right leg;10 reps    Other Quadruped Lumbar Exercises  knee lift pressup; knee lift suat    Other Quadruped Lumbar Exercises  attempted side plank on knees but hurts bilat shoulders             PT Education - 08/20/19 1100    Education Details  Access Code: [email or web address]    Person(s) Educated  Patient    Methods  Explanation;Demonstration;Verbal cues;Handout    Comprehension  Verbalized understanding;Returned demonstration       PT Short Term Goals - 08/20/19 1247      PT SHORT TERM GOAL #1   Title  ind with self massage with or without dilators    Status  Achieved      PT SHORT TERM GOAL #2   Title  Pt will be in with initial HEP and toileting techniques    Status  Achieved        PT Long  Term Goals - 07/23/19 1103      PT LONG TERM GOAL #1   Title  Pt will report No nocturia.    Baseline  1x/ nocutria      PT LONG TERM GOAL #2   Title  Marinoff 1/3 or less    Baseline  has intercourse and no pain    Status  Partially Met      PT LONG TERM GOAL #3   Title  Pt will report BM at least 5x/week without excess bearing down            Plan - 08/20/19 1100    Clinical Impression Statement  Today's session focused on core strength as patient is still showing improvements with reduced pain.  Minor setback due to leg laceration injury.  Pt demonstrates fatigue with exercises.  She was able to do exercises correctly with mild TC and VC to keep pelvis neutral and correct knee alignment.  Added several increased difficutly to HEP.    PT Treatment/Interventions  ADLs/Self Care Home Management;Biofeedback;Cryotherapy;Electrical Stimulation;Moist Heat;Therapeutic activities;Therapeutic exercise;Neuromuscular re-education;Patient/family education;Manual techniques;Dry needling;Taping    PT Next Visit Plan  core strength -  add pball exercises if still progressing; next    PT Home Exercise Plan  Access Code: [email or web address] and toilet techniques    Consulted and Agree with Plan of Care  Patient       Patient will benefit from skilled therapeutic intervention in order to improve the following deficits and impairments:  Pain, Increased fascial restricitons, Decreased strength, Decreased coordination, Decreased range of motion, Impaired flexibility, Increased muscle spasms  Visit Diagnosis: Cramp and spasm  Unspecified lack of coordination     Problem List Patient Active Problem List   Diagnosis Date Noted  . Hashimoto's thyroiditis 09/04/2018  . Hair loss 09/04/2018  . Hip pain, chronic, left 08/13/2017  . Lower back pain 08/11/2017  . Trochanteric bursitis of left hip 07/31/2017  . Sacroiliac (ligament) sprain 07/31/2017  . Common migraine 05/07/2016  . Eye pain, bilateral 05/07/2016  . Facial pain 05/07/2016  . GAD (generalized anxiety disorder) 03/22/2015  . Neck pain 07/11/2014  . Myalgia 07/11/2014  . Dysesthesia 07/11/2014    Jule Ser, PT 08/20/2019, 12:51 PM  [facility] [address], [place], [place], [address] Phone: [phone]   Fax:  [phone]  Name: CHASSIDY LAYSON MRN: [id number] Date of Birth: [birth year]

## 2019-08-20 NOTE — Patient Instructions (Signed)
Access Code: [email or web address] URL: [email or web address] Date: 08/20/2019 Prepared by: Jari Favre  Exercises Seated Hamstring Stretch - 1 x daily - 7 x weekly - 3 reps - 1 sets - 30 sec hold Standing Hamstring Stretch with Step - 1 x daily - 7 x weekly - 3 reps - 1 sets - 30 sec hold Seated Piriformis Stretch with Trunk Bend - 1 x daily - 7 x weekly - 3 reps - 1 sets - 30 sec hold Diaphragmatic Breathing in Supported Child's Pose with Pelvic Floor Relaxation - 1 x daily - 7 x weekly - 10 reps - 3 sets Shoulder Extension with Resistance - 1 x daily - 7 x weekly - 10 reps - 3 sets Supine Transversus Abdominis Bracing - Hands on Thighs - 1 x daily - 7 x weekly - 1 sets - 10 reps - 5 sec hold Bird Dog - 1 x daily - 7 x weekly - 10 reps - 3 sets Primal Push Up - 1 x daily - 7 x weekly - 10 reps - 3 sets 3-Way Lunge on Slider - 1 x daily - 7 x weekly - 3 sets - 10 reps

## 2019-08-27 ENCOUNTER — Ambulatory Visit: Payer: 59 | Admitting: Physical Therapy

## 2019-08-27 ENCOUNTER — Other Ambulatory Visit: Payer: Self-pay

## 2019-08-27 ENCOUNTER — Encounter: Payer: Self-pay | Admitting: Physical Therapy

## 2019-08-27 DIAGNOSIS — R279 Unspecified lack of coordination: Secondary | ICD-10-CM

## 2019-08-27 DIAGNOSIS — R252 Cramp and spasm: Secondary | ICD-10-CM

## 2019-08-27 NOTE — Therapy (Signed)
[facility] [address] [place], [place], [address] Phone: [phone]   Fax:  [phone]  Physical Therapy Treatment  Patient Details  Name: MOESHIA WILFONG MRN: [id number] Date of Birth: [birth year] Referring Provider (PT): Tyson Dense, MD   Encounter Date: 08/27/2019  PT End of Session - 08/27/19 1017    Visit Number  6    Date for PT Re-Evaluation  10/15/19    PT Start Time  1017    PT Stop Time  1053    PT Time Calculation (min)  36 min    Activity Tolerance  Patient tolerated treatment well    Behavior During Therapy  [facility] for tasks assessed/performed       Past Medical History:  Diagnosis Date  . Anal fissure   . IBS (irritable bowel syndrome)   . Migraine headache   . Pelvic floor dysfunction?   . Perforated eardrum     Past Surgical History:  Procedure Laterality Date  . EAR TUBE REMOVAL    . TONSILLECTOMY AND ADENOIDECTOMY    . TYMPANOPLASTY Right   . TYMPANOSTOMY TUBE PLACEMENT     x 3  . WISDOM TOOTH EXTRACTION      There were no vitals filed for this visit.  Subjective Assessment - 08/27/19 1023    Subjective  I feel good.  Pt was asked about goals and updates as reported below on goals that were achieved. States her leg is feeling 100% better now and that she is able to continue on her own with everything.    Currently in Pain?  No/denies                       [facility] Adult PT Treatment/Exercise - 08/27/19 0001      Lumbar Exercises: Aerobic   Elliptical  L3 x 5 min      Lumbar Exercises: Seated   Hip Flexion on Ball  Strengthening;Both;20 reps   with alt UE 3lb   Other Seated Lumbar Exercises  thoracic ext on ball and bridge with back on ball    Other Seated Lumbar Exercises  seated LAQ on ball      Lumbar Exercises: Supine   Other Supine Lumbar Exercises  pball - bridges LE straight and knee bent 10x each      Lumbar Exercises: Prone   Other Prone  Lumbar Exercises  pball - W, Y, T -10x each      Lumbar Exercises: Quadruped   Other Quadruped Lumbar Exercises  alt on ball - 20x               PT Short Term Goals - 08/20/19 1247      PT SHORT TERM GOAL #1   Title  ind with self massage with or without dilators    Status  Achieved      PT SHORT TERM GOAL #2   Title  Pt will be in with initial HEP and toileting techniques    Status  Achieved        PT Long Term Goals - 08/27/19 1018      PT LONG TERM GOAL #1   Title  Pt will report No nocturia.    Baseline  not getting up    Status  Achieved      PT LONG TERM GOAL #2   Title  Marinoff 1/3 or less    Baseline  so far so good  Status  Achieved      PT LONG TERM GOAL #3   Title  Pt will report BM at least 5x/week without excess bearing down    Status  Achieved      PT LONG TERM GOAL #4   Title  ind with advanced HEP    Status  Achieved            Plan - 08/27/19 1025    Clinical Impression Statement  Pt is ind and did well with all exercises today and she will d/c today with HEP    PT Treatment/Interventions  ADLs/Self Care Home Management;Biofeedback;Cryotherapy;Electrical Stimulation;Moist Heat;Therapeutic activities;Therapeutic exercise;Neuromuscular re-education;Patient/family education;Manual techniques;Dry needling;Taping    PT Next Visit Plan  ind with exercises - d/c today    PT Home Exercise Plan  Access Code: [email or web address] and toilet techniques    Consulted and Agree with Plan of Care  Patient       Patient will benefit from skilled therapeutic intervention in order to improve the following deficits and impairments:  Pain, Increased fascial restricitons, Decreased strength, Decreased coordination, Decreased range of motion, Impaired flexibility, Increased muscle spasms  Visit Diagnosis: Cramp and spasm  Unspecified lack of coordination     Problem List Patient Active Problem List   Diagnosis Date Noted  . Hashimoto's thyroiditis  09/04/2018  . Hair loss 09/04/2018  . Hip pain, chronic, left 08/13/2017  . Lower back pain 08/11/2017  . Trochanteric bursitis of left hip 07/31/2017  . Sacroiliac (ligament) sprain 07/31/2017  . Common migraine 05/07/2016  . Eye pain, bilateral 05/07/2016  . Facial pain 05/07/2016  . GAD (generalized anxiety disorder) 03/22/2015  . Neck pain 07/11/2014  . Myalgia 07/11/2014  . Dysesthesia 07/11/2014    Jule Ser, PT 08/27/2019, 10:59 AM  [facility] [address] [place], [place], [address] Phone: [phone]   Fax:  [phone]  Name: TELISHA GERDES MRN: [id number] Date of Birth: 1991/03/07

## 2019-09-13 ENCOUNTER — Other Ambulatory Visit: Payer: Self-pay

## 2019-09-13 ENCOUNTER — Encounter: Payer: Self-pay | Admitting: Internal Medicine

## 2019-09-13 ENCOUNTER — Ambulatory Visit: Payer: 59 | Admitting: Internal Medicine

## 2019-09-13 VITALS — BP 118/70 | HR 125 | Ht 65.0 in | Wt 133.0 lb

## 2019-09-13 DIAGNOSIS — E063 Autoimmune thyroiditis: Secondary | ICD-10-CM | POA: Diagnosis not present

## 2019-09-13 NOTE — Patient Instructions (Signed)
Please stop at the lab.  Please come back for a follow-up appointment in 1 year.  

## 2019-09-13 NOTE — Progress Notes (Signed)
Patient ID: [name], female   DOB: 1990-08-05, 29 y.o.   MRN: [id number]   This visit occurred during the SARS-CoV-2 public health emergency.  Safety protocols were in place, including screening questions prior to the visit, additional usage of staff PPE, and extensive cleaning of exam room while observing appropriate contact time as indicated for disinfecting solutions.   HPI  Annette Parks is a 29 y.o.-year-old very pleasant female, initially referred by her PCP, Dr. Edilia Bo, returning for follow-up for Hashimoto's thyroiditis.  Last visit 6 months ago. Her mother, Annette Parks, is also my patient.  She was diagnosed with Hashimoto's thyroiditis in 07/2018.  She has normal thyroid tests so she did not require levothyroxine.  Reviewed her TFTs: Lab Results  Component Value Date   TSH 1.58 03/08/2019   TSH 1.66 08/20/2018   TSH 1.73 08/05/2016   TSH 1.30 02/16/2016   TSH 1.529 03/22/2015   FREET4 1.02 03/08/2019   Her thyroid antibodies were elevated: Component     Latest Ref Rng & Units 03/22/2015 08/20/2018 03/08/2019  Thyroperoxidase Ab SerPl-aCnc     <9 IU/mL 2 23 (H) 46 (H)  Thyroglobulin Ab     < or = 1 IU/mL  1    We started selenium 200 mcg daily in 08/2018, but her TPO antibodies were still elevated at last check so I advised her that selenium is not mandatory. She stopped it since last visit.  She previously had mild weight gain, fatigue, cold intolerance, anxiety, constipation, hair thinning and loss.  These are stable her weight stabilized.  Her hair loss improved after starting hair skin and nails vitamins.  Pt denies: - feeling nodules in neck - hoarseness - dysphagia - choking - SOB with lying down  She has + FH of thyroid disorders in: mother, MGM. No FH of thyroid cancer. No h/o radiation tx to head or neck.  No seaweed or kelp. No recent contrast studies. No herbal supplements. No Biotin use now (stopped). No recent steroids use.   Pt. also has a history  of Raynauds phenomenon. She is on OCPs - since a teenager. She tried to stop x 8 mo restarted b/c acne and also dysmenorrhea. Now on 3 consecutive months. She also has a history of heart valve insufficiency. She has neck, lower back pain, hip pain; she was diagnosed with spina bifida occulta. She has constipation (chronic) and anal fissures. She has hyperlipidemia She got married since last visit.  No plans for children the first 2 years of marriage. She is on Amitriptyline for bladder dysfxn.   She works at [employer].  She and her husband are trying to buy a house with the increasing prices, they may wait longer.  ROS: Constitutional: + See HPI Eyes: no blurry vision, no xerophthalmia ENT: no sore throat, + see HPI Cardiovascular: no CP/no SOB/no palpitations/no leg swelling Respiratory: no cough/no SOB/no wheezing Gastrointestinal: no N/no V/no D/no C/no acid reflux Musculoskeletal: no muscle aches/no joint aches Skin: no rashes, no hair loss but hair thinning Neurological: no tremors/no numbness/no tingling/no dizziness  I reviewed pt's medications, allergies, PMH, social hx, family hx, and changes were documented in the history of present illness. Otherwise, unchanged from my initial visit note.  Past Medical History:  Diagnosis Date  . Anal fissure   . IBS (irritable bowel syndrome)   . Migraine headache   . Pelvic floor dysfunction?   . Perforated eardrum    Past Surgical History:  Procedure Laterality Date  .  EAR TUBE REMOVAL    . TONSILLECTOMY AND ADENOIDECTOMY    . TYMPANOPLASTY Right   . TYMPANOSTOMY TUBE PLACEMENT     x 3  . WISDOM TOOTH EXTRACTION     Social History   Socioeconomic History  . Marital status: Married    Spouse name: Not on file  . Number of children: 0  . Years of education: Not on file  . Highest education level: Not on file  Occupational History  . Occupation: Equities trader  Tobacco Use  . Smoking status: Never Smoker  .  Smokeless tobacco: Never Used  Substance and Sexual Activity  . Alcohol use: Yes    Alcohol/week: 0.0 standard drinks    Comment: occasional-2 per month  . Drug use: No  . Sexual activity: Not on file  Other Topics Concern  . Not on file  Social History Narrative   Single, no children    moved here from [place] went to [employer]. Moved here after her parents moved here.   Clinic nurse in [employer]   Social Determinants of Health   Financial Resource Strain:   . Difficulty of Paying Living Expenses:   Food Insecurity:   . Worried About Charity fundraiser in the Last Year:   . Arboriculturist in the Last Year:   Transportation Needs:   . Film/video editor (Medical):   Marland Kitchen Lack of Transportation (Non-Medical):   Physical Activity:   . Days of Exercise per Week:   . Minutes of Exercise per Session:   Stress:   . Feeling of Stress :   Social Connections:   . Frequency of Communication with Friends and Family:   . Frequency of Social Gatherings with Friends and Family:   . Attends Religious Services:   . Active Member of Clubs or Organizations:   . Attends Archivist Meetings:   Marland Kitchen Marital Status:   Intimate Partner Violence:   . Fear of Current or Ex-Partner:   . Emotionally Abused:   Marland Kitchen Physically Abused:   . Sexually Abused:    Current Outpatient Medications on File Prior to Visit  Medication Sig Dispense Refill  . amitriptyline (ELAVIL) 10 MG tablet amitriptyline 10 mg tablet    . cetirizine (ZYRTEC) 10 MG tablet Take by mouth.    . levonorgestrel-ethinyl estradiol (LUTERA) 0.1-20 MG-MCG tablet      No current facility-administered medications on file prior to visit.   No Known Allergies Family History  Problem Relation Age of Onset  . Migraines Mother   . Hyperlipidemia Father   . Diverticulitis Father        had to have part of colon removed  . Asthma Brother   . Diabetes Maternal Grandmother      PE: BP 118/70   Pulse (!) 125   Ht 5\' 5"  (1.651 m)   Wt 133 lb (60.3 kg)   SpO2 99%   BMI 22.13 kg/m  Wt Readings from Last 3 Encounters:  09/13/19 133 lb (60.3 kg)  03/08/19 131 lb (59.4 kg)  12/16/18 132 lb (59.9 kg)   Constitutional: Normal weight, in NAD Eyes: PERRLA, EOMI, no exophthalmos ENT: moist mucous membranes, no thyromegaly, no cervical lymphadenopathy Cardiovascular: Tachycardia RR, No MRG Respiratory: CTA B Gastrointestinal: abdomen soft, NT, ND, BS+ Musculoskeletal: no deformities, strength intact in all 4 Skin: moist, warm, no rashes Neurological: no tremor with outstretched hands, DTR normal in all 4   ASSESSMENT: 1. Hashimoto thyroiditis  PLAN: 1. Hashimoto thyroiditis -Reviewed her TFTs and they have been consistently normal while her thyroid antibodies were slightly elevated.  Therefore, she does not require thyroid hormones for now. -She denies neck compression symptoms.  We did discuss that with Hashimoto's thyroiditis, she may experience transient fullness, due to increased thyroid inflammation, which, then resolves when her immune system recovers. -Last year we started selenium in an effort to reduce her antibody titer and, subsequently, the thyroid inflammation.  We also discussed about improving her immune system by diet, exercise, sleep, relaxation. However, at last visit, her TPO antibodies were still high so we discussed that selenium was not mandatory.  She stopped it since last visit.  She does not feel different after stopping selenium. -At this visit, she only has mild fatigue, her weight is stable, and hair loss improved. -We did discuss that with high TPO antibodies, she will most likely require levothyroxine if pregnant and with a TSH higher than 2.5.  She will need to let me know when she and her husband will start trying for pregnancy -We will recheck her TFTs now and will add TPO antibodies. -I will see her back in a year  Component      Latest Ref Rng & Units 09/13/2019  TSH     0.35 - 4.50 uIU/mL 1.35  Thyroperoxidase Ab SerPl-aCnc     <9 IU/mL 53 (H)  Triiodothyronine,Free,Serum     2.3 - 4.2 pg/mL 3.3  T4,Free(Direct)     0.60 - 1.60 ng/dL 0.80   TPO antibody still slightly high.  Normal TFTs.  Philemon Kingdom, MD PhD [facility] Endocrinology

## 2019-09-14 ENCOUNTER — Encounter: Payer: Self-pay | Admitting: Internal Medicine

## 2019-09-14 LAB — T3, FREE: T3, Free: 3.3 pg/mL (ref 2.3–4.2)

## 2019-09-14 LAB — THYROID PEROXIDASE ANTIBODY: Thyroperoxidase Ab SerPl-aCnc: 53 IU/mL — ABNORMAL HIGH (ref <9)

## 2019-09-14 LAB — TSH: TSH: 1.35 u[IU]/mL (ref 0.35–4.50)

## 2019-09-14 LAB — T4, FREE: Free T4: 0.8 ng/dL (ref 0.60–1.60)

## 2019-09-28 MED FILL — LEVONOR-ETH ESTRAD 0.1-0.02: 0.1-20 | 63 days supply | Qty: 84 | Fill #2

## 2019-10-21 ENCOUNTER — Emergency Department (HOSPITAL_COMMUNITY)
Admission: EM | Admit: 2019-10-21 | Discharge: 2019-10-22 | Disposition: A | Discharge status: Home / Self Care | Payer: 59 | Attending: Emergency Medicine | Admitting: Emergency Medicine

## 2019-10-21 ENCOUNTER — Emergency Department (HOSPITAL_COMMUNITY): Payer: 59

## 2019-10-21 ENCOUNTER — Encounter (HOSPITAL_COMMUNITY): Payer: Self-pay

## 2019-10-21 ENCOUNTER — Other Ambulatory Visit: Payer: Self-pay

## 2019-10-21 DIAGNOSIS — R519 Headache, unspecified: Secondary | ICD-10-CM | POA: Diagnosis not present

## 2019-10-21 DIAGNOSIS — S0083XA Contusion of other part of head, initial encounter: Secondary | ICD-10-CM | POA: Diagnosis not present

## 2019-10-21 DIAGNOSIS — Y9301 Activity, walking, marching and hiking: Secondary | ICD-10-CM | POA: Diagnosis not present

## 2019-10-21 DIAGNOSIS — Z79899 Other long term (current) drug therapy: Secondary | ICD-10-CM | POA: Diagnosis not present

## 2019-10-21 DIAGNOSIS — Y929 Unspecified place or not applicable: Secondary | ICD-10-CM | POA: Diagnosis not present

## 2019-10-21 DIAGNOSIS — Y999 Unspecified external cause status: Secondary | ICD-10-CM | POA: Diagnosis not present

## 2019-10-21 DIAGNOSIS — W010XXA Fall on same level from slipping, tripping and stumbling without subsequent striking against object, initial encounter: Secondary | ICD-10-CM | POA: Diagnosis not present

## 2019-10-21 DIAGNOSIS — S6991XA Unspecified injury of right wrist, hand and finger(s), initial encounter: Secondary | ICD-10-CM | POA: Diagnosis present

## 2019-10-21 DIAGNOSIS — S62316A Displaced fracture of base of fifth metacarpal bone, right hand, initial encounter for closed fracture: Secondary | ICD-10-CM | POA: Insufficient documentation

## 2019-10-21 DIAGNOSIS — S6291XA Unspecified fracture of right wrist and hand, initial encounter for closed fracture: Secondary | ICD-10-CM

## 2019-10-21 DIAGNOSIS — W19XXXA Unspecified fall, initial encounter: Secondary | ICD-10-CM

## 2019-10-21 MED ORDER — ONDANSETRON 4 MG PO TBDP
4.0000 mg | ORAL_TABLET | Freq: Once | ORAL | Status: AC
  Administered 2019-10-21: 4 mg via ORAL
  Filled 2019-10-21: qty 1

## 2019-10-21 MED ORDER — ACETAMINOPHEN 325 MG PO TABS
650.0000 mg | ORAL_TABLET | Freq: Once | ORAL | Status: AC
  Administered 2019-10-21: 650 mg via ORAL
  Filled 2019-10-21: qty 2

## 2019-10-21 NOTE — Progress Notes (Signed)
Orthopedic Tech Progress Note Patient Details:  Annette Parks [birth year] [id number]  Ortho Devices Type of Ortho Device: Volar splint Ortho Device/Splint Location: RUE Ortho Device/Splint Interventions: Application   Post Interventions Patient Tolerated: Well Instructions Provided: Care of device   [name] Orlin Kann 10/21/2019, 11:13 PM

## 2019-10-21 NOTE — ED Provider Notes (Signed)
[facility] EMERGENCY DEPARTMENT Provider Note   CSN: [id number] Arrival date & time: 10/21/19  2103     History Chief Complaint  Patient presents with  . Fall    Annette Parks is a 29 y.o. female.  The history is provided by the patient and medical records.  Fall Associated symptoms include headaches.    29 year old female with history of IBS, migraine headache, Hashimoto's, presenting to the ED after a fall.  States she was on a walk with her parents and their dog, somehow got tripped up and fell face first onto the concrete.  Has reports she stuck her right hand out to try to break her fall but ultimately hit her face rather hard on the cement.  She may have blacked out for a few seconds but quickly came to.  States since this occurred she has had persistent pain in her right wrist, more so along the 5th metacarpal.  She is left hand dominant.  States headache has worsened since arrival in the ED, now feeling nauseated, dizzy, and very "off".  She has not felt confused, had any vomiting, blurred vision, slurred speech, numbness, weakness.  She did have tylenol on arrival to ED, no further medications.  Tetanus UTD.  Past Medical History:  Diagnosis Date  . Anal fissure   . IBS (irritable bowel syndrome)   . Migraine headache   . Pelvic floor dysfunction?   . Perforated eardrum     Patient Active Problem List   Diagnosis Date Noted  . Hashimoto's thyroiditis 09/04/2018  . Hair loss 09/04/2018  . Hip pain, chronic, left 08/13/2017  . Lower back pain 08/11/2017  . Trochanteric bursitis of left hip 07/31/2017  . Sacroiliac (ligament) sprain 07/31/2017  . Common migraine 05/07/2016  . Eye pain, bilateral 05/07/2016  . Facial pain 05/07/2016  . GAD (generalized anxiety disorder) 03/22/2015  . Neck pain 07/11/2014  . Myalgia 07/11/2014  . Dysesthesia 07/11/2014    Past Surgical History:  Procedure Laterality Date  . EAR TUBE REMOVAL    . TONSILLECTOMY  AND ADENOIDECTOMY    . TYMPANOPLASTY Right   . TYMPANOSTOMY TUBE PLACEMENT     x 3  . WISDOM TOOTH EXTRACTION       OB History   No obstetric history on file.     Family History  Problem Relation Age of Onset  . Migraines Mother   . Hyperlipidemia Father   . Diverticulitis Father        had to have part of colon removed  . Asthma Brother   . Diabetes Maternal Grandmother     Social History   Tobacco Use  . Smoking status: Never Smoker  . Smokeless tobacco: Never Used  Vaping Use  . Vaping Use: Never used  Substance Use Topics  . Alcohol use: Yes    Alcohol/week: 0.0 standard drinks    Comment: occasional-2 per month  . Drug use: No    Home Medications Prior to Admission medications   Medication Sig Start Date End Date Taking? Authorizing Provider  amitriptyline (ELAVIL) 10 MG tablet amitriptyline 10 mg tablet    [provider]  cetirizine (ZYRTEC) 10 MG tablet Take by mouth.    [provider]  levonorgestrel-ethinyl estradiol (LUTERA) 0.1-20 MG-MCG tablet     [provider]    Allergies    Patient has no known allergies.  Review of Systems   Review of Systems  Musculoskeletal: Positive for arthralgias.  Skin: Positive  for wound.  Neurological: Positive for headaches.  All other systems reviewed and are negative.   Physical Exam Updated Vital Signs BP (!) 189/119 (BP Location: Left Arm)   Pulse (!) 120   Temp 99 F (37.2 C) (Oral)   Resp 20   Ht 5\' 5"  (1.651 m)   Wt 59.4 kg   SpO2 100%   BMI 21.80 kg/m   Physical Exam Vitals and nursing note reviewed.  Constitutional:      Appearance: She is well-developed.  HENT:     Head: Normocephalic and atraumatic.     Comments: Moderate sized hematoma to right forehead with early bruising, abrasions noted to right forehead and beneath right eye, mid-face stable, dentition appears intact Eyes:     Conjunctiva/sclera: Conjunctivae normal.     Pupils: Pupils are equal, round,  and reactive to light.  Neck:     Comments: Full ROM, no tenderness, no deformity noted Cardiovascular:     Rate and Rhythm: Normal rate and regular rhythm.     Heart sounds: Normal heart sounds.  Pulmonary:     Effort: Pulmonary effort is normal.     Breath sounds: Normal breath sounds.  Abdominal:     General: Bowel sounds are normal.     Palpations: Abdomen is soft.  Musculoskeletal:        General: Normal range of motion.     Cervical back: Full passive range of motion without pain, normal range of motion and neck supple.     Comments: Right hand with tenderness along 5th proximal metacarpal/distal wrist, no gross deformity, pain with ROM, moving fingers normally, radial pulse intact  Skin:    General: Skin is warm and dry.  Neurological:     Mental Status: She is alert and oriented to person, place, and time.     Comments: AAOx3, answering questions and following commands appropriately; equal strength UE and LE bilaterally; CN grossly intact; moves all extremities appropriately without ataxia; no focal neuro deficits or facial asymmetry appreciated     ED Results / Procedures / Treatments   Labs (all labs ordered are listed, but only abnormal results are displayed) Labs Reviewed - No data to display  EKG None  Radiology DG Wrist Complete Right  Result Date: 10/21/2019 CLINICAL DATA:  Fall with right hand and wrist injury EXAM: RIGHT WRIST - COMPLETE 3+ VIEW COMPARISON:  None. FINDINGS: Small fracture fragment either from base of fifth metacarpal or from the hamate bone. No subluxation. No radiopaque foreign body IMPRESSION: Small fracture fragment either from base of fifth metacarpal or from the hamate bone. Electronically Signed   By: Donavan Foil M.D.   On: 10/21/2019 21:41   CT Head Wo Contrast  Result Date: 10/22/2019 CLINICAL DATA:  Headache after falling. EXAM: CT HEAD WITHOUT CONTRAST TECHNIQUE: Contiguous axial images were obtained from the base of the skull  through the vertex without intravenous contrast. COMPARISON:  None. FINDINGS: Brain: There is no mass, hemorrhage or extra-axial collection. The size and configuration of the ventricles and extra-axial CSF spaces are normal. The brain parenchyma is normal, without acute or chronic infarction. Vascular: No abnormal hyperdensity of the major intracranial arteries or dural venous sinuses. No intracranial atherosclerosis. Skull: Frontal scalp hematoma.  No skull fracture. Sinuses/Orbits: No fluid levels or advanced mucosal thickening of the visualized paranasal sinuses. No mastoid or middle ear effusion. The orbits are normal. IMPRESSION: 1. No acute intracranial abnormality. 2. Frontal scalp hematoma without skull fracture. Electronically Signed  By: Ulyses Jarred M.D.   On: 10/22/2019 00:51    Procedures Procedures (including critical care time)  Medications Ordered in ED Medications  acetaminophen (TYLENOL) tablet 650 mg (650 mg Oral Given 10/21/19 2202)  ondansetron (ZOFRAN-ODT) disintegrating tablet 4 mg (4 mg Oral Given 10/21/19 2305)    ED Course  I have reviewed the triage vital signs and the nursing notes.  Pertinent labs & imaging results that were available during my care of the patient were reviewed by me and considered in my medical decision making (see chart for details).    MDM Rules/Calculators/A&P  29 year old female presenting to the ED after fall with head trauma.  Was walking with family and dog when she got tripped up, fell, hit her right forehead on the concrete.  There was questionable loss of consciousness for a few seconds.  Since this time she has developed worsening headache and nausea.  She is not had any vomiting.  No dizziness, confusion, numbness, or weakness.  She also has some right wrist pain, husband reports she tried to break her fall.  She is generally left-hand-dominant.  She is awake, alert, appropriately oriented here.  Her neurologic exam is nonfocal.  She does  have moderate size hematoma with abrasions to right forehead and right upper cheek.  There is no facial deformity noted, dentition intact.  Does have some tenderness along the ulnar aspect of right wrist and fifth metacarpal.  Does appear to have a small fracture on x-ray.  Volar splint was applied.  CT of the head was obtained, no acute findings.  Patient given Zofran here with improvement of nausea.  She remains without any vomiting and has been able to tolerate oral fluids.  May have slight concussion.  Feel she is stable for discharge home with concussion precautions.  Rx.  Encouraged Tylenol Motrin as needed for headache.  Can follow-up with PCP.  She is also given hand surgery follow-up for wrist fracture.  She may return here for any new or acute changes.  Final Clinical Impression(s) / ED Diagnoses Final diagnoses:  Fall, initial encounter  Closed fracture of right hand, initial encounter    Rx / DC Orders ED Discharge Orders         Ordered    ondansetron (ZOFRAN ODT) 4 MG disintegrating tablet  Every 8 hours PRN     Discontinue  Reprint     10/22/19 0059    ibuprofen (ADVIL) 800 MG tablet  3 times daily     Discontinue  Reprint     10/22/19 Sarles, Sven Pinheiro M, PA-C 10/22/19 [place], Delice Bison, DO 10/22/19 5320

## 2019-10-21 NOTE — ED Triage Notes (Addendum)
Pt arrives to ED w/ c/o R wrist pain secondary to a fall. Pt tripped while walking her dog. Pt reports 6/10 pain, no obvious deformity. Pt has abrasions to R side of face. Pt endorses headache, AOx4, neuro intact.

## 2019-10-22 ENCOUNTER — Emergency Department (HOSPITAL_COMMUNITY): Payer: 59

## 2019-10-22 DIAGNOSIS — R519 Headache, unspecified: Secondary | ICD-10-CM | POA: Diagnosis not present

## 2019-10-22 DIAGNOSIS — S0083XA Contusion of other part of head, initial encounter: Secondary | ICD-10-CM | POA: Diagnosis not present

## 2019-10-22 DIAGNOSIS — Z79899 Other long term (current) drug therapy: Secondary | ICD-10-CM | POA: Diagnosis not present

## 2019-10-22 DIAGNOSIS — S62316A Displaced fracture of base of fifth metacarpal bone, right hand, initial encounter for closed fracture: Secondary | ICD-10-CM | POA: Diagnosis not present

## 2019-10-22 MED ORDER — ONDANSETRON 4 MG PO TBDP: 4.0000 mg | ORAL_TABLET | Freq: Three times a day (TID) | ORAL | 0 refills | Status: DC | PRN

## 2019-10-22 MED ORDER — IBUPROFEN 800 MG PO TABS: 800.0000 mg | ORAL_TABLET | Freq: Three times a day (TID) | ORAL | 0 refills | Status: DC

## 2019-10-22 NOTE — ED Notes (Signed)
Patient verbalizes understanding of discharge instructions. Opportunity for questioning and answers were provided. Armband removed by staff, pt discharged from ED. Pt. ambulatory and discharged home.  

## 2019-10-22 NOTE — Discharge Instructions (Signed)
Take the prescribed medication as directed-- sent to pharmacy for you..  You may continue to have some headaches and nausea for the next few days or up to 2 weeks which can be normal.   Try to limit TV, reading, cell phone, Ipad use, etc to reduce eye strain and subsequent headaches. Follow-up with Dr. Jeannie Fend-- call in the morning to schedule appt. Return to the ED for new or worsening symptoms.

## 2019-10-26 DIAGNOSIS — M25511 Pain in right shoulder: Secondary | ICD-10-CM | POA: Insufficient documentation

## 2019-10-26 DIAGNOSIS — M25531 Pain in right wrist: Secondary | ICD-10-CM | POA: Insufficient documentation

## 2019-10-26 DIAGNOSIS — M79641 Pain in right hand: Secondary | ICD-10-CM | POA: Diagnosis not present

## 2019-10-29 ENCOUNTER — Other Ambulatory Visit (HOSPITAL_COMMUNITY): Payer: Self-pay | Admitting: Obstetrics and Gynecology

## 2019-10-29 DIAGNOSIS — N941 Unspecified dyspareunia: Secondary | ICD-10-CM | POA: Diagnosis not present

## 2019-10-29 DIAGNOSIS — Z6822 Body mass index (BMI) 22.0-22.9, adult: Secondary | ICD-10-CM | POA: Diagnosis not present

## 2019-10-29 DIAGNOSIS — N3010 Interstitial cystitis (chronic) without hematuria: Secondary | ICD-10-CM | POA: Diagnosis not present

## 2019-10-29 DIAGNOSIS — Z01419 Encounter for gynecological examination (general) (routine) without abnormal findings: Secondary | ICD-10-CM | POA: Diagnosis not present

## 2019-10-29 MED FILL — AMITRIPTYLINE HCL 10 MG TAB: 10 | 90 days supply | Qty: 90 | Fill #0

## 2019-11-05 DIAGNOSIS — S62306A Unspecified fracture of fifth metacarpal bone, right hand, initial encounter for closed fracture: Secondary | ICD-10-CM | POA: Insufficient documentation

## 2019-11-12 DIAGNOSIS — S62306D Unspecified fracture of fifth metacarpal bone, right hand, subsequent encounter for fracture with routine healing: Secondary | ICD-10-CM | POA: Diagnosis not present

## 2019-11-18 MED FILL — AMITRIPTYLINE HCL 10 MG TAB: 10 | 90 days supply | Qty: 90 | Fill #2

## 2019-11-26 LAB — HEPATIC FUNCTION PANEL
ALT: 13 (ref 7–35)
AST: 15 (ref 13–35)
Alkaline Phosphatase: 57 (ref 25–125)
Bilirubin, Total: 1

## 2019-11-26 LAB — BASIC METABOLIC PANEL
BUN: 11 (ref 4–21)
Creatinine: 0.8 (ref <=1.1)

## 2019-11-26 LAB — HM HIV SCREENING LAB: HM HIV Screening: NEGATIVE

## 2019-11-26 LAB — LIPID PANEL
Cholesterol: 174 (ref 0–200)
HDL: 76 — AB (ref 35–70)
LDL Cholesterol: 71
LDl/HDL Ratio: 2.3
Triglycerides: 134 (ref 40–160)

## 2019-12-10 DIAGNOSIS — S62306D Unspecified fracture of fifth metacarpal bone, right hand, subsequent encounter for fracture with routine healing: Secondary | ICD-10-CM | POA: Diagnosis not present

## 2019-12-17 MED FILL — LEVONOR-ETH ESTRAD 0.1-0.02: 0.1-20 | 70 days supply | Qty: 84 | Fill #0

## 2019-12-22 ENCOUNTER — Encounter: Payer: 59 | Admitting: Family Medicine

## 2019-12-25 NOTE — Progress Notes (Deleted)
[place] at [facility] [address], [place], [place] [address] [phone]  Date:  12/27/2019   Name:  Annette Parks   DOB:  Sep 02, 1990   MRN:  [id number]  PCP:  Darreld Mclean, MD    Chief Complaint: No chief complaint on file.   History of Present Illness:  Annette Parks is a 29 y.o. 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  She was also seen in the ER back in April, after she sustained a laceration from a blender blade  COVID-19 vaccine Pap per her gynecologist Labs on chart from 2020-thyroid levels from May of this year are also noted  Never smoker Moderate alcohol Exercise Patient Active Problem List   Diagnosis Date Noted  . Hashimoto's thyroiditis 09/04/2018  . Hair loss 09/04/2018  . Hip pain, chronic, left 08/13/2017  . Lower back pain 08/11/2017  . Trochanteric bursitis of left hip 07/31/2017  . Sacroiliac (ligament) sprain 07/31/2017  . Common migraine 05/07/2016  . Eye pain, bilateral 05/07/2016  . Facial pain 05/07/2016  . GAD (generalized anxiety disorder) 03/22/2015  . Neck pain 07/11/2014  . Myalgia 07/11/2014  . Dysesthesia 07/11/2014    Past Medical History:  Diagnosis Date  . Anal fissure   . IBS (irritable bowel syndrome)   . Migraine headache   . Pelvic floor dysfunction?   . Perforated eardrum     Past Surgical History:  Procedure Laterality Date  . EAR TUBE REMOVAL    . TONSILLECTOMY AND ADENOIDECTOMY    . TYMPANOPLASTY Right   . TYMPANOSTOMY TUBE PLACEMENT     x 3  . WISDOM TOOTH EXTRACTION      Social History    Tobacco Use  . Smoking status: Never Smoker  . Smokeless tobacco: Never Used  Vaping Use  . Vaping Use: Never used  Substance Use Topics  . Alcohol use: Yes    Alcohol/week: 0.0 standard drinks    Comment: occasional-2 per month  . Drug use: No    Family History  Problem Relation Age of Onset  . Migraines Mother   . Hyperlipidemia Father   . Diverticulitis Father        had to have part of colon removed  . Asthma Brother   . Diabetes Maternal Grandmother     No Known Allergies  Medication list has been reviewed and updated.  Current Outpatient Medications on File Prior to Visit  Medication Sig Dispense Refill  . amitriptyline (ELAVIL) 10 MG tablet amitriptyline 10 mg tablet    . cetirizine (ZYRTEC) 10 MG tablet Take by mouth.    Marland Kitchen ibuprofen (ADVIL) 800 MG tablet Take 1 tablet (800 mg total) by mouth 3 (three) times daily. 21 tablet 0  . levonorgestrel-ethinyl estradiol (LUTERA) 0.1-20 MG-MCG tablet     . ondansetron (ZOFRAN ODT) 4 MG disintegrating tablet Take 1 tablet (4 mg total) by mouth every 8 (eight) hours as needed for nausea. 10 tablet 0   No current facility-administered medications on file prior to visit.    Review of Systems:  As per  HPI- otherwise negative.   Physical Examination: There were no vitals filed for this visit. There were no vitals filed for this visit. There is no height or weight on file to calculate BMI. Ideal Body Weight:    GEN: no acute distress. HEENT: Atraumatic, Normocephalic.  Ears and Nose: No external deformity. CV: RRR, No M/G/R. No JVD. No thrill. No extra heart sounds. PULM: CTA B, no wheezes, crackles, rhonchi. No retractions. No resp. distress. No accessory muscle use. ABD: S, NT, ND, +BS. No rebound. No HSM. EXTR: No c/c/e PSYCH: Normally interactive. Conversant.    Assessment and Plan: *** Routine physical today Encouraged continued healthy diet and exercise habits This visit occurred during the SARS-CoV-2  public health emergency.  Safety protocols were in place, including screening questions prior to the visit, additional usage of staff PPE, and extensive cleaning of exam room while observing appropriate contact time as indicated for disinfecting solutions.    Signed Lamar Blinks, MD

## 2019-12-27 ENCOUNTER — Encounter: Payer: Self-pay | Admitting: Family Medicine

## 2019-12-27 ENCOUNTER — Telehealth: Payer: Self-pay | Admitting: Cardiology

## 2019-12-27 ENCOUNTER — Other Ambulatory Visit: Payer: Self-pay

## 2019-12-27 ENCOUNTER — Ambulatory Visit (INDEPENDENT_AMBULATORY_CARE_PROVIDER_SITE_OTHER): Payer: 59 | Admitting: Family Medicine

## 2019-12-27 VITALS — BP 114/72 | HR 121 | Resp 16 | Ht 65.0 in | Wt 133.0 lb

## 2019-12-27 DIAGNOSIS — Z131 Encounter for screening for diabetes mellitus: Secondary | ICD-10-CM

## 2019-12-27 DIAGNOSIS — I471 Supraventricular tachycardia, unspecified: Secondary | ICD-10-CM

## 2019-12-27 DIAGNOSIS — Z0000 Encounter for general adult medical examination without abnormal findings: Secondary | ICD-10-CM | POA: Diagnosis not present

## 2019-12-27 DIAGNOSIS — Z1159 Encounter for screening for other viral diseases: Secondary | ICD-10-CM

## 2019-12-27 DIAGNOSIS — Z130 Encounter for screening for diseases of the blood and blood-forming organs and certain disorders involving the immune mechanism: Secondary | ICD-10-CM | POA: Diagnosis not present

## 2019-12-27 DIAGNOSIS — Z538 Procedure and treatment not carried out for other reasons: Secondary | ICD-10-CM

## 2019-12-27 NOTE — Progress Notes (Addendum)
[place] at [facility] [address], [place], [place] [address] [phone]  Date:  12/27/2019   Name:  Annette Parks   DOB:  06/26/1990   MRN:  [id number]  PCP:  Darreld Mclean, MD    Chief Complaint: Annual Exam   History of Present Illness:  Annette Parks is a 29 y.o. very pleasant female patient who presents with the following:  Generally healthy young woman here today for physical exam She brings in some recent lab work done for her insurance company for review Normal A1c, lipids favorable, A1c normal   Last seen by myself about 1 year ago for physical She has Hashimoto's thyroiditis, is managed by endocrinology, Dr. Conni Slipper currently on any medication  She was married this past November.  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  The lowest heart rate she tends to see is in the 90s She does have history of PVCs and tachycardia No recent CP-she did have some episodes of sharp chest pain last year She thinks she had an echocardiogram when she was a teenager- She was told she had mitral valve regurg when she was in [place] she is not getting a lot of exercise recently due to being very busy   Patient Active Problem List   Diagnosis Date Noted  . Hashimoto's thyroiditis 09/04/2018  . Hair loss 09/04/2018  . Hip pain, chronic, left 08/13/2017  . Lower back pain 08/11/2017  . Trochanteric bursitis of left hip 07/31/2017  . Sacroiliac (ligament) sprain 07/31/2017  . Common migraine  05/07/2016  . Eye pain, bilateral 05/07/2016  . Facial pain 05/07/2016  . GAD (generalized anxiety disorder) 03/22/2015  . Neck pain 07/11/2014  . Myalgia 07/11/2014  . Dysesthesia 07/11/2014    Past Medical History:  Diagnosis Date  . Anal fissure   . IBS (irritable bowel syndrome)   . Migraine headache   . Pelvic floor dysfunction?   . Perforated eardrum     Past Surgical History:  Procedure Laterality Date  . EAR TUBE REMOVAL    . TONSILLECTOMY AND ADENOIDECTOMY    . TYMPANOPLASTY Right   . TYMPANOSTOMY TUBE PLACEMENT     x 3  . WISDOM TOOTH EXTRACTION      Social History   Tobacco Use  . Smoking status: Never Smoker  . Smokeless tobacco: Never Used  Vaping Use  . Vaping Use: Never used  Substance Use Topics  . Alcohol use: Yes    Alcohol/week: 0.0 standard drinks    Comment: occasional-2 per month  . Drug use: No    Family History  Problem Relation Age of Onset  . Migraines Mother   . Hyperlipidemia Father   . Diverticulitis Father        had to have part of colon removed  . Asthma Brother   . Diabetes Maternal Grandmother     No Known Allergies  Medication list has been reviewed and updated.  Current Outpatient Medications  on File Prior to Visit  Medication Sig Dispense Refill  . amitriptyline (ELAVIL) 10 MG tablet amitriptyline 10 mg tablet    . cetirizine (ZYRTEC) 10 MG tablet Take by mouth.    . levonorgestrel-ethinyl estradiol (LUTERA) 0.1-20 MG-MCG tablet      No current facility-administered medications on file prior to visit.    Review of Systems:  As per HPI- otherwise negative.   Physical Examination: Vitals:   12/27/19 1351  BP: 114/72  Pulse: (!) 121  Resp: 16  SpO2: 98%   Vitals:   12/27/19 1351  Weight: 133 lb (60.3 kg)  Height: 5\' 5"  (1.651 m)   Body mass index is 22.13 kg/m. Ideal Body Weight: Weight in (lb) to have BMI = 25: 149.9  GEN: no acute distress.  Slender build, looks well HEENT: Atraumatic,  Normocephalic.   Bilateral TM wnl, oropharynx normal.  PEERL,EOMI.   Ears and Nose: No external deformity. CV: RRR-tachycardic, No M/G/R. No JVD. No thrill. No extra heart sounds. PULM: CTA B, no wheezes, crackles, rhonchi. No retractions. No resp. distress. No accessory muscle use. ABD: S, NT, ND, +BS. No rebound. No HSM. EXTR: No c/c/e PSYCH: Normally interactive. Conversant.   Pulse Readings from Last 3 Encounters:  12/27/19 (!) 121  10/22/19 97  09/13/19 (!) 125  2018 pulse 99 2019 pulse 92, 96 EKG shows tachycardia 114 bpm machine  read out as atrial fib/flutter but appears to be sinus tach.  Discussed with cardiology Dr. The day, Dr. Stanford Breed.  He confirms this  is actually sinus rhythm with some PACs. Assessment and Plan: Physical exam  Encounter for hepatitis C screening test for low risk patient - Plan: Hepatitis C antibody  Screening for iron deficiency anemia - Plan: CBC  Screening for diabetes mellitus - Plan: Basic metabolic panel  Paroxysmal supraventricular tachycardia ([place]) - Plan: EKG 12-Lead, TSH, Ambulatory referral to Cardiology, CANCELED: Ambulatory referral to Cardiology, CANCELED: Ambulatory referral to Cardiology  Physical exam today Generally healthy young woman, some labs recently done per her life insurance company will abstract.  Other labs are pending as above Main concern today is tachycardia.  EKG today confirms sinus tachycardia with rate of about 115.  The patient and her husband are considering starting a family in the next year or two, she would like to have cardiac evaluation be sure all is well which is quite reasonable.  Referral to cardiology placed today Asked her let me know if any change or worsening of her symptoms in the meantime  Will plan further follow- up pending labs. Discussed flu shot and COVID-19 district This visit occurred during the SARS-CoV-2 public health emergency.  Safety protocols were in place, including screening questions  prior to the visit, additional usage of staff PPE, and extensive cleaning of exam room while observing appropriate contact time as indicated for disinfecting solutions.    Signed Lamar Blinks, MD  addnd 8/31- received her labs as below  Results for orders placed or performed in visit on 12/27/19  CBC  Result Value Ref Range   WBC 7.6 3.8 - 10.8 Thousand/uL   RBC 5.17 (H) 3.80 - 5.10 Million/uL   Hemoglobin 14.4 11.7 - 15.5 g/dL   HCT 43.0 35 - 45 %   MCV 83.2 80.0 - 100.0 fL   MCH 27.9 27.0 - 33.0 pg   MCHC 33.5 32.0 - 36.0 g/dL   RDW 12.4 11.0 - 15.0 %   Platelets 306 140 - 400 Thousand/uL   MPV 11.4  7.5 - 12.5 fL  Basic metabolic panel  Result Value Ref Range   Glucose, Bld 120 (H) 65 - 99 mg/dL   BUN 9 7 - 25 mg/dL   Creat 0.85 0.50 - 1.10 mg/dL   BUN/Creatinine Ratio NOT APPLICABLE 6 - 22 (calc)   Sodium 138 135 - 146 mmol/L   Potassium 4.2 3.5 - 5.3 mmol/L   Chloride 104 98 - 110 mmol/L   CO2 24 20 - 32 mmol/L   Calcium 9.5 8.6 - 10.2 mg/dL  Hepatitis C antibody  Result Value Ref Range   Hepatitis C Ab NON-REACTIVE NON-REACTI   SIGNAL TO CUT-OFF 0.01 <1.00  TSH  Result Value Ref Range   TSH 1.67 mIU/L

## 2019-12-27 NOTE — Patient Instructions (Signed)
Great to see you again today as always!  Take care and I will be in touch with your labs asap We will get you set up to see cardiology for an evaluation as well  Flu shot, covid booster this fall   Health Maintenance, Female Adopting a healthy lifestyle and getting preventive care are important in promoting health and wellness. Ask your health care provider about:  The right schedule for you to have regular tests and exams.  Things you can do on your own to prevent diseases and keep yourself healthy. What should I know about diet, weight, and exercise? Eat a healthy diet   Eat a diet that includes plenty of vegetables, fruits, low-fat dairy products, and lean protein.  Do not eat a lot of foods that are high in solid fats, added sugars, or sodium. Maintain a healthy weight Body mass index (BMI) is used to identify weight problems. It estimates body fat based on height and weight. Your health care provider can help determine your BMI and help you achieve or maintain a healthy weight. Get regular exercise Get regular exercise. This is one of the most important things you can do for your health. Most adults should:  Exercise for at least 150 minutes each week. The exercise should increase your heart rate and make you sweat (moderate-intensity exercise).  Do strengthening exercises at least twice a week. This is in addition to the moderate-intensity exercise.  Spend less time sitting. Even light physical activity can be beneficial. Watch cholesterol and blood lipids Have your blood tested for lipids and cholesterol at 29 years of age, then have this test every 5 years. Have your cholesterol levels checked more often if:  Your lipid or cholesterol levels are high.  You are older than 29 years of age.  You are at high risk for heart disease. What should I know about cancer screening? Depending on your health history and family history, you may need to have cancer screening at various  ages. This may include screening for:  Breast cancer.  Cervical cancer.  Colorectal cancer.  Skin cancer.  Lung cancer. What should I know about heart disease, diabetes, and high blood pressure? Blood pressure and heart disease  High blood pressure causes heart disease and increases the risk of stroke. This is more likely to develop in people who have high blood pressure readings, are of African descent, or are overweight.  Have your blood pressure checked: ? Every 3-5 years if you are 70-44 years of age. ? Every year if you are 6 years old or older. Diabetes Have regular diabetes screenings. This checks your fasting blood sugar level. Have the screening done:  Once every three years after age 58 if you are at a normal weight and have a low risk for diabetes.  More often and at a younger age if you are overweight or have a high risk for diabetes. What should I know about preventing infection? Hepatitis B If you have a higher risk for hepatitis B, you should be screened for this virus. Talk with your health care provider to find out if you are at risk for hepatitis B infection. Hepatitis C Testing is recommended for:  Everyone born from [birth year] through 1965.  Anyone with known risk factors for hepatitis C. Sexually transmitted infections (STIs)  Get screened for STIs, including gonorrhea and chlamydia, if: ? You are sexually active and are younger than 29 years of age. ? You are older than 29 years of age  and your health care provider tells you that you are at risk for this type of infection. ? Your sexual activity has changed since you were last screened, and you are at increased risk for chlamydia or gonorrhea. Ask your health care provider if you are at risk.  Ask your health care provider about whether you are at high risk for HIV. Your health care provider may recommend a prescription medicine to help prevent HIV infection. If you choose to take medicine to prevent HIV, you  should first get tested for HIV. You should then be tested every 3 months for as long as you are taking the medicine. Pregnancy  If you are about to stop having your period (premenopausal) and you may become pregnant, seek counseling before you get pregnant.  Take 400 to 800 micrograms (mcg) of folic acid every day if you become pregnant.  Ask for birth control (contraception) if you want to prevent pregnancy. Osteoporosis and menopause Osteoporosis is a disease in which the bones lose minerals and strength with aging. This can result in bone fractures. If you are 30 years old or older, or if you are at risk for osteoporosis and fractures, ask your health care provider if you should:  Be screened for bone loss.  Take a calcium or vitamin D supplement to lower your risk of fractures.  Be given hormone replacement therapy (HRT) to treat symptoms of menopause. Follow these instructions at home: Lifestyle  Do not use any products that contain nicotine or tobacco, such as cigarettes, e-cigarettes, and chewing tobacco. If you need help quitting, ask your health care provider.  Do not use street drugs.  Do not share needles.  Ask your health care provider for help if you need support or information about quitting drugs. Alcohol use  Do not drink alcohol if: ? Your health care provider tells you not to drink. ? You are pregnant, may be pregnant, or are planning to become pregnant.  If you drink alcohol: ? Limit how much you use to 0-1 drink a day. ? Limit intake if you are breastfeeding.  Be aware of how much alcohol is in your drink. In the [place] one drink equals one 12 oz bottle of beer (355 mL), one 5 oz glass of wine (148 mL), or one 1 oz glass of hard liquor (44 mL). General instructions  Schedule regular health, dental, and eye exams.  Stay current with your vaccines.  Tell your health care provider if: ? You often feel depressed. ? You have ever been abused or do not feel  safe at home. Summary  Adopting a healthy lifestyle and getting preventive care are important in promoting health and wellness.  Follow your health care provider's instructions about healthy diet, exercising, and getting tested or screened for diseases.  Follow your health care provider's instructions on monitoring your cholesterol and blood pressure. This information is not intended to replace advice given to you by your health care provider. Make sure you discuss any questions you have with your health care provider. Document Revised: 04/08/2018 Document Reviewed: 04/08/2018 Elsevier Patient Education  2020 [employer].

## 2019-12-27 NOTE — Telephone Encounter (Signed)
Dr Stanford Breed reviewed EKG and discussed with dr copeland.

## 2019-12-28 ENCOUNTER — Encounter: Payer: Self-pay | Admitting: Family Medicine

## 2019-12-29 ENCOUNTER — Encounter: Payer: Self-pay | Admitting: Family Medicine

## 2019-12-29 ENCOUNTER — Other Ambulatory Visit: Payer: Self-pay | Admitting: Family Medicine

## 2019-12-29 DIAGNOSIS — I471 Supraventricular tachycardia: Secondary | ICD-10-CM

## 2019-12-29 LAB — CBC
HCT: 43 % (ref 35.0–45.0)
Hemoglobin: 14.4 g/dL (ref 11.7–15.5)
MCH: 27.9 pg (ref 27.0–33.0)
MCHC: 33.5 g/dL (ref 32.0–36.0)
MCV: 83.2 fL (ref 80.0–100.0)
MPV: 11.4 fL (ref 7.5–12.5)
Platelets: 306 10*3/uL (ref 140–400)
RBC: 5.17 10*6/uL — ABNORMAL HIGH (ref 3.80–5.10)
RDW: 12.4 % (ref 11.0–15.0)
WBC: 7.6 10*3/uL (ref 3.8–10.8)

## 2019-12-29 LAB — BASIC METABOLIC PANEL
BUN: 9 mg/dL (ref 7–25)
CO2: 24 mmol/L (ref 20–32)
Calcium: 9.5 mg/dL (ref 8.6–10.2)
Chloride: 104 mmol/L (ref 98–110)
Creat: 0.85 mg/dL (ref 0.50–1.10)
Glucose, Bld: 120 mg/dL — ABNORMAL HIGH (ref 65–99)
Potassium: 4.2 mmol/L (ref 3.5–5.3)
Sodium: 138 mmol/L (ref 135–146)

## 2019-12-29 LAB — TSH: TSH: 1.67 mIU/L

## 2019-12-29 LAB — HEPATITIS C ANTIBODY
Hepatitis C Ab: NONREACTIVE
SIGNAL TO CUT-OFF: 0.01 (ref <1.00)

## 2020-01-01 ENCOUNTER — Encounter: Payer: Self-pay | Admitting: Family Medicine

## 2020-01-07 ENCOUNTER — Other Ambulatory Visit: Payer: Self-pay

## 2020-01-07 ENCOUNTER — Telehealth: Payer: Self-pay | Admitting: Radiology

## 2020-01-07 ENCOUNTER — Ambulatory Visit (HOSPITAL_COMMUNITY): Payer: 59 | Attending: Cardiovascular Disease

## 2020-01-07 ENCOUNTER — Encounter: Payer: Self-pay | Admitting: Family Medicine

## 2020-01-07 DIAGNOSIS — I471 Supraventricular tachycardia: Secondary | ICD-10-CM | POA: Insufficient documentation

## 2020-01-07 LAB — ECHOCARDIOGRAM COMPLETE
Area-P 1/2: 3.53 cm2
S' Lateral: 2.4 cm

## 2020-01-07 NOTE — Telephone Encounter (Signed)
Enrolled patient for a 14 day Zio XT monitor to be mailed to patients home. Brief instructions were gone over with patient and she knows to expect the monitor to arrive in 3-4 days.

## 2020-01-17 ENCOUNTER — Ambulatory Visit (INDEPENDENT_AMBULATORY_CARE_PROVIDER_SITE_OTHER): Payer: 59

## 2020-01-17 DIAGNOSIS — I471 Supraventricular tachycardia: Secondary | ICD-10-CM | POA: Diagnosis not present

## 2020-01-18 ENCOUNTER — Encounter: Payer: Self-pay | Admitting: Cardiovascular Disease

## 2020-01-18 ENCOUNTER — Other Ambulatory Visit: Payer: Self-pay

## 2020-01-18 ENCOUNTER — Ambulatory Visit: Payer: 59 | Admitting: Cardiovascular Disease

## 2020-01-18 DIAGNOSIS — R002 Palpitations: Secondary | ICD-10-CM | POA: Diagnosis not present

## 2020-01-18 NOTE — Progress Notes (Signed)
01/18/2020 Annette Parks   30-Aug-1990  [id number]  Primary Physician Copland, Gay Filler, MD Primary Cardiologist: Lorretta Harp MD Annette Parks, [place]  HPI:  Annette Parks is a 29 y.o. 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 Janett Billow Copland for evaluation of palpitations.  She basically has no cardiovascular risk factors.  She said palpitations since she was an adolescent related to PVCs.  There is a question of valvular heart disease by remote echo although 2D echo performed 01/07/2020 was entirely normal.  She does have migraine headaches, Raynaud's phenomenon and Hashimoto's thyroiditis with normal TSH and IBS.  She says that over the last year she is had increased frequency and severity of her palpitations.  This is triggered by anxiety.  Also occurs for no reason.  She is currently wearing a 2-week Zio patch which she just put on yesterday.   Current Meds  Medication Sig   amitriptyline (ELAVIL) 10 MG tablet amitriptyline 10 mg tablet   cetirizine (ZYRTEC) 10 MG tablet Take by mouth.   levonorgestrel-ethinyl estradiol (LUTERA) 0.1-20 MG-MCG tablet      No Known Allergies  Social History   Socioeconomic History   Marital status: Married    Spouse name: Not on file   Number of children: 0   Years of education: Not on file   Highest education level: Not on file  Occupational History   Occupation: Registered nurse  Tobacco Use   Smoking status: Never Smoker   Smokeless tobacco: Never Used  Vaping Use   Vaping Use: Never used  Substance and Sexual Activity   Alcohol use: Yes    Alcohol/week: 0.0 standard drinks    Comment: occasional-2 per month   Drug use: No   Sexual activity: Not on file  Other Topics Concern   Not on file  Social History Narrative   Single, no children    moved here from [place] went to [employer] [facility] [employer] [employer].  Moved here after her parents moved here.   Clinic nurse in [employer]   Social Determinants of Health   Financial Resource Strain:    Difficulty of Paying Living Expenses: Not on file  Food Insecurity:    Worried About [place] in the Last Year: Not on file   Ran Out of Food in the Last Year: Not on file  Transportation Needs:    Lack of Transportation (Medical): Not on file   Lack of Transportation (Non-Medical): Not on file  Physical Activity:    Days of Exercise per Week: Not on file   Minutes of Exercise per Session: Not on file  Stress:    Feeling of Stress : Not on file  Social Connections:    Frequency of Communication with Friends and Family: Not on file   Frequency of Social Gatherings with Friends and Family: Not on file   Attends Religious Services: Not on file   Active Member of Clubs or Organizations: Not on file   Attends Archivist Meetings: Not on file   Marital Status: Not on file  Intimate Partner Violence:    Fear of Current or Ex-Partner: Not on file   Emotionally Abused: Not on file   Physically Abused: Not on file   Sexually Abused: Not on file     Review of Systems: General: negative for chills, fever, night sweats or  weight changes.  Cardiovascular: negative for chest pain, dyspnea on exertion, edema, orthopnea, palpitations, paroxysmal nocturnal dyspnea or shortness of breath Dermatological: negative for rash Respiratory: negative for cough or wheezing Urologic: negative for hematuria Abdominal: negative for nausea, vomiting, diarrhea, bright red blood per rectum, melena, or hematemesis Neurologic: negative for visual changes, syncope, or dizziness All other systems reviewed and are otherwise negative except as noted above.    Blood pressure 120/88, pulse (!) 103, height 5\' 5"  (1.651 m), weight 134 lb 6.4 oz (61 kg), last menstrual period 01/10/2020, SpO2 99 %.  General appearance: alert  and no distress Neck: no adenopathy, no carotid bruit, no JVD, supple, symmetrical, trachea midline and thyroid not enlarged, symmetric, no tenderness/mass/nodules Lungs: clear to auscultation bilaterally Heart: regular rate and rhythm, S1, S2 normal, no murmur, click, rub or gallop Extremities: extremities normal, atraumatic, no cyanosis or edema Pulses: 2+ and symmetric Skin: Skin color, texture, turgor normal. No rashes or lesions Neurologic: Alert and oriented X 3, normal strength and tone. Normal symmetric reflexes. Normal coordination and gait  EKG sinus tachycardia 103 with RSR prime in leads V1 and V2 suggesting RV conduction delay.  I personally reviewed this EKG.  ASSESSMENT AND PLAN:   Palpitations Ms. Spurling is referred to me by Dr. Edilia Bo for evaluation of palpitations.  He has had a history of PVCs for many years and she was an adolescent.  She says that within the last year she has noticed increased frequency and severity.  She is never had dizziness or presyncope.  She also has migraine headaches and Raynaud's phenomenon.  She is currently wearing a Zio patch.  She also has Hashimoto's thyroiditis with normal TSH followed by an endocrinologist.  She drinks minimal amounts of caffeine.  She says that her palpitations get worse with stress.  She is currently wearing a 2-week Zio patch.      Lorretta Harp MD FACP,FACC,FAHA, [facility] 01/18/2020 10:14 AM

## 2020-01-18 NOTE — Assessment & Plan Note (Signed)
Annette Parks is referred to me by Dr. Edilia Bo for evaluation of palpitations.  He has had a history of PVCs for many years and she was an adolescent.  She says that within the last year she has noticed increased frequency and severity.  She is never had dizziness or presyncope.  She also has migraine headaches and Raynaud's phenomenon.  She is currently wearing a Zio patch.  She also has Hashimoto's thyroiditis with normal TSH followed by an endocrinologist.  She drinks minimal amounts of caffeine.  She says that her palpitations get worse with stress.  She is currently wearing a 2-week Zio patch.

## 2020-01-18 NOTE — Patient Instructions (Signed)
Medication Instructions:  None Ordered At This Time.  *If you need a refill on your cardiac medications before your next appointment, please call your pharmacy*  Lab Work: None Ordered At This Time.  If you have labs (blood work) drawn today and your tests are completely normal, you will receive your results only by: Marland Kitchen MyChart Message (if you have MyChart) OR . A paper copy in the mail If you have any lab test that is abnormal or we need to change your treatment, we will call you to review the results.  Testing/Procedures: None Ordered At This Time.   Follow-Up: At [facility], you and your health needs are our priority.  As part of our continuing mission to provide you with exceptional heart care, we have created designated Provider Care Teams.  These Care Teams include your primary Cardiologist (physician) and Advanced Practice Providers (APPs -  Physician Assistants and Nurse Practitioners) who all work together to provide you with the care you need, when you need it.  Your next appointment:   3 month(s) to follow up Zio Patch   The format for your next appointment:   In Person  Provider:   Quay Burow, MD

## 2020-01-24 LAB — CHG ANGIOTENSIN I-CONVERTING ENZYME: A1c: 5.1

## 2020-01-24 LAB — CHG ASSAY OF GLUTAMYLTRASE GAMMA: GGT: 15 (ref 10–52)

## 2020-02-05 DIAGNOSIS — I471 Supraventricular tachycardia: Secondary | ICD-10-CM | POA: Diagnosis not present

## 2020-02-08 ENCOUNTER — Other Ambulatory Visit: Payer: Self-pay | Admitting: Family Medicine

## 2020-02-08 DIAGNOSIS — I471 Supraventricular tachycardia: Secondary | ICD-10-CM

## 2020-02-23 MED FILL — AMITRIPTYLINE HCL 10 MG TAB: 10 | 90 days supply | Qty: 90 | Fill #3

## 2020-02-23 MED FILL — LEVONOR-ETH ESTRAD 0.1-0.02: 0.1-20 | 70 days supply | Qty: 84 | Fill #1

## 2020-03-07 DIAGNOSIS — L720 Epidermal cyst: Secondary | ICD-10-CM | POA: Diagnosis not present

## 2020-03-07 DIAGNOSIS — L738 Other specified follicular disorders: Secondary | ICD-10-CM | POA: Diagnosis not present

## 2020-03-07 DIAGNOSIS — Q825 Congenital non-neoplastic nevus: Secondary | ICD-10-CM | POA: Diagnosis not present

## 2020-03-07 DIAGNOSIS — L821 Other seborrheic keratosis: Secondary | ICD-10-CM | POA: Diagnosis not present

## 2020-03-07 DIAGNOSIS — L858 Other specified epidermal thickening: Secondary | ICD-10-CM | POA: Diagnosis not present

## 2020-03-07 DIAGNOSIS — L813 Cafe au lait spots: Secondary | ICD-10-CM | POA: Diagnosis not present

## 2020-04-04 ENCOUNTER — Other Ambulatory Visit: Payer: Self-pay

## 2020-04-04 ENCOUNTER — Ambulatory Visit: Payer: 59 | Admitting: Cardiovascular Disease

## 2020-04-04 ENCOUNTER — Encounter: Payer: Self-pay | Admitting: Cardiovascular Disease

## 2020-04-04 VITALS — BP 102/80 | HR 98 | Ht 65.0 in | Wt 129.4 lb

## 2020-04-04 DIAGNOSIS — R002 Palpitations: Secondary | ICD-10-CM | POA: Diagnosis not present

## 2020-04-04 NOTE — Patient Instructions (Signed)
Medication Instructions:  Your physician recommends that you continue on your current medications as directed. Please refer to the Current Medication list given to you today.  *If you need a refill on your cardiac medications before your next appointment, please call your pharmacy*  Follow-Up: At [facility], you and your health needs are our priority.  As part of our continuing mission to provide you with exceptional heart care, we have created designated Provider Care Teams.  These Care Teams include your primary Cardiologist (physician) and Advanced Practice Providers (APPs -  Physician Assistants and Nurse Practitioners) who all work together to provide you with the care you need, when you need it.  We recommend signing up for the patient portal called "MyChart".  Sign up information is provided on this After Visit Summary.  MyChart is used to connect with patients for Virtual Visits (Telemedicine).  Patients are able to view lab/test results, encounter notes, upcoming appointments, etc.  Non-urgent messages can be sent to your provider as well.   To learn more about what you can do with MyChart, go to [email or web address].    Your next appointment:   You may see Dr. Gwenlyn Found as needed. Please call our office if you need to schedule an appointment.

## 2020-04-04 NOTE — Progress Notes (Signed)
Annette Parks returns for follow-up of her noninvasive test.  2D echo was entirely normal and a 2-week Zio patch showed only isolated PACs.  She has been started on a beta-blocker in the past for her migraines which she did not tolerate (atenolol).  Her blood pressure is low to begin with.  We talked about pharmacologic therapy but we both agree this is not appropriate at this time.  She feels better knowing that her palpitations are benign.  I will see her back as needed.  Lorretta Harp, M.D., [place], [facility], [place] [address] [place], [place]  [id number]  [phone] 04/04/2020 9:43 AM

## 2020-05-04 DIAGNOSIS — Z20822 Contact with and (suspected) exposure to covid-19: Secondary | ICD-10-CM | POA: Diagnosis not present

## 2020-05-29 MED FILL — AMITRIPTYLINE HCL 10 MG TAB: 10 | 90 days supply | Qty: 90 | Fill #0

## 2020-05-30 ENCOUNTER — Other Ambulatory Visit (HOSPITAL_COMMUNITY): Payer: Self-pay | Admitting: Obstetrics and Gynecology

## 2020-06-25 ENCOUNTER — Telehealth: Payer: 59 | Admitting: Physician Assistant

## 2020-06-25 DIAGNOSIS — M5442 Lumbago with sciatica, left side: Secondary | ICD-10-CM | POA: Diagnosis not present

## 2020-06-25 MED ORDER — NAPROXEN 500 MG PO TABS: 500.0000 mg | ORAL_TABLET | Freq: Two times a day (BID) | ORAL | 0 refills | Status: DC

## 2020-06-25 MED ORDER — BACLOFEN 10 MG PO TABS: 10.0000 mg | ORAL_TABLET | Freq: Three times a day (TID) | ORAL | 0 refills | Status: DC

## 2020-06-25 NOTE — Progress Notes (Signed)
I have spent 5 minutes in review of e-visit questionnaire, review and updating patient chart, medical decision making and response to patient.   Rebakah Cokley Cody Darothy Courtright, PA-C    

## 2020-06-25 NOTE — Progress Notes (Signed)

## 2020-06-27 ENCOUNTER — Ambulatory Visit: Payer: 59 | Admitting: Orthopaedic Surgery

## 2020-06-27 ENCOUNTER — Other Ambulatory Visit: Payer: Self-pay

## 2020-06-27 ENCOUNTER — Encounter: Payer: Self-pay | Admitting: Orthopaedic Surgery

## 2020-06-27 ENCOUNTER — Other Ambulatory Visit: Payer: Self-pay | Admitting: Orthopaedic Surgery

## 2020-06-27 VITALS — Ht 65.0 in | Wt 129.0 lb

## 2020-06-27 DIAGNOSIS — M5442 Lumbago with sciatica, left side: Secondary | ICD-10-CM | POA: Diagnosis not present

## 2020-06-27 MED ORDER — METHYLPREDNISOLONE 4 MG PO TBPK: ORAL_TABLET | ORAL | 0 refills | Status: DC

## 2020-06-27 MED FILL — METHYLPREDNISOLONE 4 MG TBP: 4 | 6 days supply | Qty: 21 | Fill #0

## 2020-06-27 NOTE — Progress Notes (Signed)
Office Visit Note   Patient: Annette Parks           Date of Birth: 28-Mar-1991           MRN: [id number] Visit Date: 06/27/2020              Requested by: Darreld Mclean, MD [address] [place],  [place] [address] PCP: Darreld Mclean, MD   Assessment & Plan: Visit Diagnoses:  1. Left-sided low back pain with left-sided sciatica, unspecified chronicity     Plan: 4 to 5-day history of low back pain associated with left lower extremity radiculopathy.  Neurologically intact.  No bowel or bladder changes in the last week.  Has had a prior MRI scan of her pelvis and lumbar spine in 2019 that were negative.  Could have nerve root irritation but not having any trouble with coughing or sneezing and straight leg raise is negative.  We will try a Medrol Dosepak and reevaluate in 1 week.  Consider injecting  trigger point tenderness in the left parasacral region if no improvement.  Also consider repeat MRI scan  Follow-Up Instructions: Return in about 1 week (around 07/04/2020).   Orders:  No orders of the defined types were placed in this encounter.  Meds ordered this encounter  Medications  . methylPREDNISolone (MEDROL DOSEPAK) 4 MG TBPK tablet    Sig: Take as directed on package.    Dispense:  21 tablet    Refill:  0      Procedures: No procedures performed   Clinical Data: No additional findings.   Subjective: Chief Complaint  Patient presents with  . Lower Back - Pain  Patient presents today for lower back and left leg pain. She states that she woke Friday morning with lower back and left leg pain. She said that the pain is worse with bending, activity, or anything pushing on her lower back. She has left leg weakness and feels like it is "fatigued". She had an E-visit and was prescribed baclofen and Naproxen. 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 She did try a full dose yesterday and states that it seemed to help some.  She has gotten relief with Lidocaine patches and heat. She had a previous MRI of her lower back in 2019.  Achiness and heaviness of her left leg off and on for the past year.  Also has had repeat current back pain over the last several years.  In 2019 she had an MRI scan of her lumbar spine and pelvis both of which were negative.  No changes in bowel or bladder function.  No right lower extremity symptoms  HPI  Review of Systems   Objective: Vital Signs: Ht 5\' 5"  (1.651 m)   Wt 129 lb (58.5 kg)   BMI 21.47 kg/m   Physical Exam Constitutional:      Appearance: She is well-developed and well-nourished.  HENT:     Mouth/Throat:     Mouth: Oropharynx is clear and moist.  Eyes:     Extraocular Movements: EOM normal.     Pupils: Pupils are equal, round, and reactive to light.  Pulmonary:     Effort: Pulmonary effort is normal.  Skin:    General: Skin is warm and dry.  Neurological:     Mental Status: She is alert and oriented to person, place, and time.  Psychiatric:        Mood and  Affect: Mood and affect normal.        Behavior: Behavior normal.     Ortho Exam straight leg raise is negative bilaterally.  Reflexes were brisk and symmetrical.  Motor and sensory exam intact.  Had an area of localized tenderness in the left parasacral region but no masses.  No midline tenderness.  No pain in the groin with range of motion or pain over the greater trochanter.  Specialty Comments:  No specialty comments available.  Imaging: No results found.   PMFS History: Patient Active Problem List   Diagnosis Date Noted  . Palpitations 01/18/2020  . Hashimoto's thyroiditis 09/04/2018  . Hair loss 09/04/2018  . Hip pain, chronic, left 08/13/2017  . Lower back pain 08/11/2017  . Trochanteric bursitis of left hip 07/31/2017  . Sacroiliac (ligament) sprain 07/31/2017  . Common migraine 05/07/2016  . Eye pain, bilateral 05/07/2016  . Facial pain 05/07/2016  . GAD (generalized anxiety  disorder) 03/22/2015  . Neck pain 07/11/2014  . Myalgia 07/11/2014  . Dysesthesia 07/11/2014   Past Medical History:  Diagnosis Date  . Anal fissure   . IBS (irritable bowel syndrome)   . Migraine headache   . Pelvic floor dysfunction?   . Perforated eardrum     Family History  Problem Relation Age of Onset  . Migraines Mother   . Hyperlipidemia Father   . Diverticulitis Father        had to have part of colon removed  . Asthma Brother   . Diabetes Maternal Grandmother     Past Surgical History:  Procedure Laterality Date  . EAR TUBE REMOVAL    . TONSILLECTOMY AND ADENOIDECTOMY    . TYMPANOPLASTY Right   . TYMPANOSTOMY TUBE PLACEMENT     x 3  . WISDOM TOOTH EXTRACTION     Social History   Occupational History  . Occupation: Equities trader  Tobacco Use  . Smoking status: Never Smoker  . Smokeless tobacco: Never Used  Vaping Use  . Vaping Use: Never used  Substance and Sexual Activity  . Alcohol use: Yes    Alcohol/week: 0.0 standard drinks    Comment: occasional-2 per month  . Drug use: No  . Sexual activity: Not on file

## 2020-07-11 ENCOUNTER — Other Ambulatory Visit: Payer: Self-pay

## 2020-07-11 ENCOUNTER — Encounter: Payer: Self-pay | Admitting: Orthopaedic Surgery

## 2020-07-11 ENCOUNTER — Ambulatory Visit: Payer: 59 | Admitting: Orthopaedic Surgery

## 2020-07-11 VITALS — Ht 65.0 in | Wt 129.0 lb

## 2020-07-11 DIAGNOSIS — M5442 Lumbago with sciatica, left side: Secondary | ICD-10-CM | POA: Diagnosis not present

## 2020-07-11 NOTE — Progress Notes (Signed)
Office Visit Note   Patient: Annette Parks           Date of Birth: Jul 08, 1990           MRN: [id number] Visit Date: 07/11/2020              Requested by: Darreld Mclean, MD [address] [place],  [place] [address] PCP: Darreld Mclean, MD   Assessment & Plan: Visit Diagnoses:  1. Left-sided low back pain with left-sided sciatica, unspecified chronicity     Plan: Low back pain has resolved with a Medrol Dosepak.  Still having some posterior left thigh pain.  Hamstring appears to be tight.  Not having any numbness or tingling or bowel or bladder changes.  Will ask therapy to evaluate for back exercises and hamstring stretching.  In follow-up can consider repeat MRI of lumbar spine.  Had negative MRI scan in 2018  Follow-Up Instructions: Return if symptoms worsen or fail to improve.   Orders:  Orders Placed This Encounter  Procedures  . Ambulatory referral to Physical Therapy   No orders of the defined types were placed in this encounter.     Procedures: No procedures performed   Clinical Data: No additional findings.   Subjective: Chief Complaint  Patient presents with  . Lower Back - Follow-up  Patient presents today for follow up on her lower back. She was here two weeks ago and started a medrol dose pak. She finished the dosepak last week and states that it definitely helped with her lower back pain. She still has the same pain in her left leg and, especially, in the posterior thigh.  No numbness or tingling or discomfort distal to the knee.  No groin pain  HPI  Review of Systems   Objective: Vital Signs: Ht 5\' 5"  (1.651 m)   Wt 129 lb (58.5 kg)   BMI 21.47 kg/m   Physical Exam Constitutional:      Appearance: She is well-developed.  Eyes:     Pupils: Pupils are equal, round, and reactive to light.  Pulmonary:     Effort: Pulmonary effort is normal.  Skin:    General: Skin is warm and dry.  Neurological:     Mental Status: She  is alert and oriented to person, place, and time.  Psychiatric:        Behavior: Behavior normal.     Ortho Exam awake and alert.  Walks without a limp.  No percussible back pain.  No pain over either SI joint.  No pain over the ischial tuberosity or buttock.  Does have some tightness of her left hamstring.  No tightness on the right.  Neurologically intact.  No knee pain Specialty Comments:  No specialty comments available.  Imaging: No results found.   PMFS History: Patient Active Problem List   Diagnosis Date Noted  . Palpitations 01/18/2020  . Hashimoto's thyroiditis 09/04/2018  . Hair loss 09/04/2018  . Hip pain, chronic, left 08/13/2017  . Lower back pain 08/11/2017  . Trochanteric bursitis of left hip 07/31/2017  . Sacroiliac (ligament) sprain 07/31/2017  . Common migraine 05/07/2016  . Eye pain, bilateral 05/07/2016  . Facial pain 05/07/2016  . GAD (generalized anxiety disorder) 03/22/2015  . Neck pain 07/11/2014  . Myalgia 07/11/2014  . Dysesthesia 07/11/2014   Past Medical History:  Diagnosis Date  . Anal fissure   . IBS (irritable bowel syndrome)   . Migraine headache   . Pelvic  floor dysfunction?   . Perforated eardrum     Family History  Problem Relation Age of Onset  . Migraines Mother   . Hyperlipidemia Father   . Diverticulitis Father        had to have part of colon removed  . Asthma Brother   . Diabetes Maternal Grandmother     Past Surgical History:  Procedure Laterality Date  . EAR TUBE REMOVAL    . TONSILLECTOMY AND ADENOIDECTOMY    . TYMPANOPLASTY Right   . TYMPANOSTOMY TUBE PLACEMENT     x 3  . WISDOM TOOTH EXTRACTION     Social History   Occupational History  . Occupation: Equities trader  Tobacco Use  . Smoking status: Never Smoker  . Smokeless tobacco: Never Used  Vaping Use  . Vaping Use: Never used  Substance and Sexual Activity  . Alcohol use: Yes    Alcohol/week: 0.0 standard drinks    Comment: occasional-2 per month   . Drug use: No  . Sexual activity: Not on file

## 2020-07-28 ENCOUNTER — Ambulatory Visit: Payer: 59 | Admitting: Rehabilitative and Restorative Service Providers"

## 2020-08-21 ENCOUNTER — Other Ambulatory Visit (HOSPITAL_COMMUNITY): Payer: Self-pay

## 2020-08-21 MED FILL — Levonorgestrel & Ethinyl Estradiol Tab 0.1 MG-20 MCG: ORAL | 63 days supply | Qty: 84 | Fill #0 | Status: AC

## 2020-09-05 DIAGNOSIS — L821 Other seborrheic keratosis: Secondary | ICD-10-CM | POA: Diagnosis not present

## 2020-09-05 DIAGNOSIS — Q825 Congenital non-neoplastic nevus: Secondary | ICD-10-CM | POA: Diagnosis not present

## 2020-09-18 ENCOUNTER — Ambulatory Visit: Payer: 59 | Admitting: Internal Medicine

## 2020-09-18 ENCOUNTER — Encounter: Payer: Self-pay | Admitting: Internal Medicine

## 2020-09-18 ENCOUNTER — Other Ambulatory Visit: Payer: Self-pay

## 2020-09-18 VITALS — BP 120/78 | HR 102 | Ht 65.0 in | Wt 130.0 lb

## 2020-09-18 DIAGNOSIS — E063 Autoimmune thyroiditis: Secondary | ICD-10-CM | POA: Diagnosis not present

## 2020-09-18 NOTE — Progress Notes (Addendum)
Patient ID: [name], female   DOB: April 07, 1991, 30 y.o.   MRN: [id number]   This visit occurred during the SARS-CoV-2 public health emergency.  Safety protocols were in place, including screening questions prior to the visit, additional usage of staff PPE, and extensive cleaning of exam room while observing appropriate contact time as indicated for disinfecting solutions.   HPI  AKSHITHA CULMER is a 30 y.o.-year-old very pleasant female, initially referred by her PCP, Dr. Edilia Bo, returning for follow-up for Hashimoto's thyroiditis.  Last visit 1 year ago. Her mother, [name], is also my patient.  Interim hx: She describes getting very hot at night for last few years, despite leaving air conditioning on 31F and having the ceiling fan on. She has palpitations - had cardiology eval. In last year >> PVCs. She started a prenatal vitamin. She had a "lump in throat" after each Covid vaccine. She has [place] in 04/2020 - mild. She had back pain.  Reviewed history: She was diagnosed with Hashimoto's thyroiditis in 07/2018.  She has normal thyroid tests so she did not require levothyroxine.  Reviewed her TFTs: Lab Results  Component Value Date   TSH 1.67 12/27/2019   TSH 1.35 09/13/2019   TSH 1.58 03/08/2019   TSH 1.66 08/20/2018   TSH 1.73 08/05/2016   TSH 1.30 02/16/2016   TSH 1.529 03/22/2015   FREET4 0.80 09/13/2019   FREET4 1.02 03/08/2019   Her thyroid antibodies were elevated: Component     Latest Ref Rng & Units 09/13/2019  Thyroperoxidase Ab SerPl-aCnc     <9 IU/mL 53 (H)   Component     Latest Ref Rng & Units 03/22/2015 08/20/2018 03/08/2019  Thyroperoxidase Ab SerPl-aCnc     <9 IU/mL 2 23 (H) 46 (H)  Thyroglobulin Ab     < or = 1 IU/mL  1    We started selenium 200 mcg daily in 08/2018, but her TPO antibodies were still elevated at last check so I advised her that selenium is not mandatory, so she stopped it.  She previously had mild weight gain, fatigue, cold  intolerance, anxiety, constipation, hair thinning and loss.  These improved.  Her hair loss improved after starting hair skin and nails vitamins.  Now on prenatal vitamins.  She is now on Slim fast diet.  Pt denies: - feeling nodules in neck - hoarseness - dysphagia - choking - SOB with lying down  She has + FH of thyroid disorders in: mother, MGM. No FH of thyroid cancer. No h/o radiation tx to head or neck.  No seaweed or kelp. No recent contrast studies. No herbal supplements. No Biotin use now. No recent steroids use.   Pt. also has a history of Raynauds phenomenon. She is on OCPs - since a teenager. She tried to stop x 8 mo restarted b/c acne and also dysmenorrhea. Now on 3 consecutive months. She also has a history of heart valve insufficiency. She has neck, lower back pain, hip pain; she was diagnosed with spina bifida occulta. She has constipation (chronic) and anal fissures. She has hyperlipidemia She got married since last visit.  No plans for children the first 2 years of marriage. She is on Amitriptyline for bladder dysfxn.   She works at [employer].  She and her husband are trying to buy a house with the increasing prices, they may wait longer.  They are planning for pregnancy later in the year.  ROS: Constitutional: + See HPI Eyes: no blurry vision,  no xerophthalmia ENT: no sore throat, + see HPI Cardiovascular: no CP/no SOB/no palpitations/no leg swelling Respiratory: no cough/no SOB/no wheezing Gastrointestinal: no N/no V/no D/no C/no acid reflux Musculoskeletal: no muscle aches/no joint aches Skin: no rashes, no hair loss but hair thinning Neurological: no tremors/no numbness/no tingling/no dizziness  I reviewed pt's medications, allergies, PMH, social hx, family hx, and changes were documented in the history of present illness. Otherwise, unchanged from my initial visit note.  Past Medical History:  Diagnosis Date  . Anal fissure   . IBS (irritable  bowel syndrome)   . Migraine headache   . Pelvic floor dysfunction?   . Perforated eardrum    Past Surgical History:  Procedure Laterality Date  . EAR TUBE REMOVAL    . TONSILLECTOMY AND ADENOIDECTOMY    . TYMPANOPLASTY Right   . TYMPANOSTOMY TUBE PLACEMENT     x 3  . WISDOM TOOTH EXTRACTION     Social History   Socioeconomic History  . Marital status: Married    Spouse name: Not on file  . Number of children: 0  . Years of education: Not on file  . Highest education level: Not on file  Occupational History  . Occupation: Equities trader  Tobacco Use  . Smoking status: Never Smoker  . Smokeless tobacco: Never Used  Vaping Use  . Vaping Use: Never used  Substance and Sexual Activity  . Alcohol use: Yes    Alcohol/week: 0.0 standard drinks    Comment: occasional-2 per month  . Drug use: No  . Sexual activity: Not on file  Other Topics Concern  . Not on file  Social History Narrative   Single, no children    moved here from [place] went to [facility]. Moved here after her parents moved here.   Clinic nurse in [employer]   Social Determinants of Health   Financial Resource Strain: Not on file  Food Insecurity: Not on file  Transportation Needs: Not on file  Physical Activity: Not on file  Stress: Not on file  Social Connections: Not on file  Intimate Partner Violence: Not on file   Current Outpatient Medications on File Prior to Visit  Medication Sig Dispense Refill  . cetirizine (ZYRTEC) 10 MG tablet Take by mouth.    . levonorgestrel-ethinyl estradiol (ALESSE) 0.1-20 MG-MCG tablet      No current facility-administered medications on file prior to visit.   No Known Allergies Family History  Problem Relation Age of Onset  . Migraines Mother   . Hyperlipidemia Father   . Diverticulitis Father        had to have part of colon removed  . Asthma Brother   . Diabetes Maternal Grandmother     PE: BP 120/78 (BP  Location: Right Arm, Patient Position: Sitting, Cuff Size: Normal)   Pulse (!) 102   Ht 5\' 5"  (1.651 m)   Wt 130 lb (59 kg)   SpO2 99%   BMI 21.63 kg/m  Wt Readings from Last 3 Encounters:  09/18/20 130 lb (59 kg)  07/11/20 129 lb (58.5 kg)  06/27/20 129 lb (58.5 kg)   Constitutional: Normal weight, in NAD Eyes: PERRLA, EOMI, no exophthalmos ENT: moist mucous membranes, no thyromegaly, no cervical lymphadenopathy Cardiovascular: Tachycardia, RR, No MRG Respiratory: CTA B Gastrointestinal: abdomen soft, NT, ND, BS+ Musculoskeletal: no deformities, strength intact in all 4 Skin: moist, warm, no rashes Neurological: no tremor with outstretched hands, DTR normal in all 4  ASSESSMENT: 1.  Hashimoto thyroiditis  PLAN: 1. Hashimoto thyroiditis -Euthyroid -Reviewed together her previous TFTs from last visit and also from 11/2020 and these were excellent -Her TPO antibodies are slightly elevated, and a little higher at last visit. -We tried selenium in the past but this did not help in decreasing her TPO antibodies -We discussed that this is a evidence that her immune system is active against her thyroid, but the inflammation did not destroy her thyroid gland so we do not need to use levothyroxine. -We discussed about possibly checking another thyroid test before she gets pregnant and possibly even afterwards, if not checked by OB/GYN -No neck compression symptoms but she did have neck fullness after her COVID vaccines, likely an inflamed lymph node.  This resolved. -At this visit we will recheck her TFTs and TPO antibodies -I will see her back in a year, possibly sooner for labs  Component     Latest Ref Rng & Units 09/18/2020  TSH     0.35 - 4.50 uIU/mL 1.60  Thyroglobulin Ab     < or = 1 IU/mL 1  Thyroperoxidase Ab SerPl-aCnc     <9 IU/mL 94 (H)  Triiodothyronine,Free,Serum     2.3 - 4.2 pg/mL 3.5  T4,Free(Direct)     0.60 - 1.60 ng/dL 0.90  TFTs are normal, but TPO antibody  titer is higher. Will recommend a healthier diet - we did discuss about the slim fast diet at the time of the visit and I recommended to not continue this long-term since it relies on shakes and protein bars for all her meals and snacks except dinner.   Philemon Kingdom, MD PhD [facility] Endocr[facility]

## 2020-09-18 NOTE — Patient Instructions (Signed)
Please stop at the lab.  Please come back for a follow-up appointment in 1 year.  

## 2020-09-19 LAB — T4, FREE: Free T4: 0.9 ng/dL (ref 0.60–1.60)

## 2020-09-19 LAB — TSH: TSH: 1.6 u[IU]/mL (ref 0.35–4.50)

## 2020-09-19 LAB — T3, FREE: T3, Free: 3.5 pg/mL (ref 2.3–4.2)

## 2020-09-19 LAB — THYROGLOBULIN ANTIBODY: Thyroglobulin Ab: 1 IU/mL (ref <=1)

## 2020-09-19 LAB — THYROID PEROXIDASE ANTIBODY: Thyroperoxidase Ab SerPl-aCnc: 94 IU/mL — ABNORMAL HIGH (ref <9)

## 2020-10-13 DIAGNOSIS — H52223 Regular astigmatism, bilateral: Secondary | ICD-10-CM | POA: Diagnosis not present

## 2020-11-06 ENCOUNTER — Other Ambulatory Visit (HOSPITAL_COMMUNITY): Payer: Self-pay

## 2020-11-06 DIAGNOSIS — N3010 Interstitial cystitis (chronic) without hematuria: Secondary | ICD-10-CM | POA: Diagnosis not present

## 2020-11-06 DIAGNOSIS — Z01411 Encounter for gynecological examination (general) (routine) with abnormal findings: Secondary | ICD-10-CM | POA: Diagnosis not present

## 2020-11-06 DIAGNOSIS — Z6822 Body mass index (BMI) 22.0-22.9, adult: Secondary | ICD-10-CM | POA: Diagnosis not present

## 2020-11-06 DIAGNOSIS — Z319 Encounter for procreative management, unspecified: Secondary | ICD-10-CM | POA: Diagnosis not present

## 2020-11-06 DIAGNOSIS — N941 Unspecified dyspareunia: Secondary | ICD-10-CM | POA: Diagnosis not present

## 2020-11-06 DIAGNOSIS — Z01419 Encounter for gynecological examination (general) (routine) without abnormal findings: Secondary | ICD-10-CM | POA: Diagnosis not present

## 2020-11-06 DIAGNOSIS — Z309 Encounter for contraceptive management, unspecified: Secondary | ICD-10-CM | POA: Diagnosis not present

## 2020-11-06 MED ORDER — AMITRIPTYLINE HCL 10 MG PO TABS
10.0000 mg | ORAL_TABLET | Freq: Every day | ORAL | 2 refills | Status: DC
  Filled 2020-11-06: qty 90, 90d supply, fill #0

## 2020-11-06 MED ORDER — LEVONORGESTREL-ETHINYL ESTRAD 0.1-20 MG-MCG PO TABS
ORAL_TABLET | ORAL | 3 refills | Status: DC
  Filled 2020-11-06: qty 112, 84d supply, fill #0

## 2020-11-07 ENCOUNTER — Other Ambulatory Visit (HOSPITAL_COMMUNITY): Payer: Self-pay

## 2020-11-15 ENCOUNTER — Other Ambulatory Visit (HOSPITAL_COMMUNITY): Payer: Self-pay

## 2020-12-02 ENCOUNTER — Telehealth: Payer: 59 | Admitting: Nurse Practitioner

## 2020-12-02 DIAGNOSIS — N3000 Acute cystitis without hematuria: Secondary | ICD-10-CM | POA: Diagnosis not present

## 2020-12-02 MED ORDER — NITROFURANTOIN MONOHYD MACRO 100 MG PO CAPS: 100.0000 mg | ORAL_CAPSULE | Freq: Two times a day (BID) | ORAL | 0 refills | Status: AC

## 2020-12-02 NOTE — Progress Notes (Signed)
E-Visit for Urinary Problems  We are sorry that you are not feeling well.  Here is how we plan to help!  Based on what you shared with me it looks like you most likely have a simple urinary tract infection.  A UTI (Urinary Tract Infection) is a bacterial infection of the bladder.  Most cases of urinary tract infections are simple to treat but a key part of your care is to encourage you to drink plenty of fluids and watch your symptoms carefully.  I have prescribed MacroBid 100 mg twice a day for 5 days.  Your symptoms should gradually improve. Call us if the burning in your urine worsens, you develop worsening fever, back pain or pelvic pain or if your symptoms do not resolve after completing the antibiotic.  Urinary tract infections can be prevented by drinking plenty of water to keep your body hydrated.  Also be sure when you wipe, wipe from front to back and don't hold it in!  If possible, empty your bladder every 4 hours.  HOME CARE Drink plenty of fluids Compete the full course of the antibiotics even if the symptoms resolve Remember, when you need to go.go. Holding in your urine can increase the likelihood of getting a UTI! GET HELP RIGHT AWAY IF: You cannot urinate You get a high fever Worsening back pain occurs You see blood in your urine You feel sick to your stomach or throw up You feel like you are going to pass out  MAKE SURE YOU  Understand these instructions. Will watch your condition. Will get help right away if you are not doing well or get worse.   Thank you for choosing an e-visit.  Your e-visit answers were reviewed by a board certified advanced clinical practitioner to complete your personal care plan. Depending upon the condition, your plan could have included both over the counter or prescription medications.  Please review your pharmacy choice. Make sure the pharmacy is open so you can pick up prescription now. If there is a problem, you may contact your  provider through [employer] and have the prescription routed to another pharmacy.  Your safety is important to [place]. If you have drug allergies check your prescription carefully.   For the next 24 hours you can use MyChart to ask questions about today's visit, request a non-urgent call back, or ask for a work or school excuse. You will get an email in the next two days asking about your experience. I hope that your e-visit has been valuable and will speed your recovery.   I spent approximately 7 minutes reviewing the patient's history, current symptoms and coordinating their plan of care today.    Meds ordered this encounter  Medications   nitrofurantoin, macrocrystal-monohydrate, (MACROBID) 100 MG capsule    Sig: Take 1 capsule (100 mg total) by mouth 2 (two) times daily for 5 days.    Dispense:  10 capsule    Refill:  0

## 2020-12-18 DIAGNOSIS — Z319 Encounter for procreative management, unspecified: Secondary | ICD-10-CM | POA: Diagnosis not present

## 2020-12-20 DIAGNOSIS — O209 Hemorrhage in early pregnancy, unspecified: Secondary | ICD-10-CM | POA: Diagnosis not present

## 2020-12-22 DIAGNOSIS — O209 Hemorrhage in early pregnancy, unspecified: Secondary | ICD-10-CM | POA: Diagnosis not present

## 2020-12-26 ENCOUNTER — Other Ambulatory Visit (HOSPITAL_COMMUNITY): Payer: Self-pay

## 2020-12-26 DIAGNOSIS — N911 Secondary amenorrhea: Secondary | ICD-10-CM | POA: Diagnosis not present

## 2020-12-26 DIAGNOSIS — O209 Hemorrhage in early pregnancy, unspecified: Secondary | ICD-10-CM | POA: Diagnosis not present

## 2020-12-26 DIAGNOSIS — E063 Autoimmune thyroiditis: Secondary | ICD-10-CM | POA: Diagnosis not present

## 2020-12-26 DIAGNOSIS — R110 Nausea: Secondary | ICD-10-CM | POA: Diagnosis not present

## 2020-12-26 MED ORDER — DOXYLAMINE-PYRIDOXINE 10-10 MG PO TBEC
DELAYED_RELEASE_TABLET | ORAL | 0 refills | Status: DC
  Filled 2020-12-26: qty 30, 15d supply, fill #0

## 2021-01-02 DIAGNOSIS — O209 Hemorrhage in early pregnancy, unspecified: Secondary | ICD-10-CM | POA: Diagnosis not present

## 2021-01-04 DIAGNOSIS — O0281 Inappropriate change in quantitative human chorionic gonadotropin (hCG) in early pregnancy: Secondary | ICD-10-CM | POA: Diagnosis not present

## 2021-01-05 ENCOUNTER — Inpatient Hospital Stay (HOSPITAL_COMMUNITY)
Admission: AD | Admit: 2021-01-05 | Discharge: 2021-01-05 | Disposition: A | Discharge status: Home / Self Care | Payer: 59 | Attending: Obstetrics and Gynecology | Admitting: Obstetrics and Gynecology

## 2021-01-05 ENCOUNTER — Encounter (HOSPITAL_COMMUNITY): Payer: Self-pay | Admitting: Obstetrics and Gynecology

## 2021-01-05 ENCOUNTER — Other Ambulatory Visit: Payer: Self-pay

## 2021-01-05 DIAGNOSIS — O00109 Unspecified tubal pregnancy without intrauterine pregnancy: Secondary | ICD-10-CM | POA: Insufficient documentation

## 2021-01-05 DIAGNOSIS — O26891 Other specified pregnancy related conditions, first trimester: Secondary | ICD-10-CM | POA: Diagnosis not present

## 2021-01-05 DIAGNOSIS — N911 Secondary amenorrhea: Secondary | ICD-10-CM | POA: Diagnosis not present

## 2021-01-05 DIAGNOSIS — O26851 Spotting complicating pregnancy, first trimester: Secondary | ICD-10-CM | POA: Diagnosis not present

## 2021-01-05 DIAGNOSIS — R1031 Right lower quadrant pain: Secondary | ICD-10-CM | POA: Diagnosis not present

## 2021-01-05 DIAGNOSIS — O021 Missed abortion: Secondary | ICD-10-CM | POA: Diagnosis not present

## 2021-01-05 DIAGNOSIS — Z3A08 8 weeks gestation of pregnancy: Secondary | ICD-10-CM | POA: Diagnosis not present

## 2021-01-05 HISTORY — DX: Other specified postprocedural states: Z98.890

## 2021-01-05 HISTORY — DX: Unspecified abnormal cytological findings in specimens from vagina: R87.629

## 2021-01-05 HISTORY — DX: Nausea with vomiting, unspecified: R11.2

## 2021-01-05 HISTORY — DX: Autoimmune thyroiditis: E06.3

## 2021-01-05 HISTORY — DX: Urinary tract infection, site not specified: N39.0

## 2021-01-05 LAB — CBC
HCT: 43.2 % (ref 36.0–46.0)
Hemoglobin: 14.5 g/dL (ref 12.0–15.0)
MCH: 28 pg (ref 26.0–34.0)
MCHC: 33.6 g/dL (ref 30.0–36.0)
MCV: 83.4 fL (ref 80.0–100.0)
Platelets: 294 10*3/uL (ref 150–400)
RBC: 5.18 MIL/uL — ABNORMAL HIGH (ref 3.87–5.11)
RDW: 12.5 % (ref 11.5–15.5)
WBC: 7 10*3/uL (ref 4.0–10.5)
nRBC: 0 % (ref 0.0–0.2)

## 2021-01-05 LAB — COMPREHENSIVE METABOLIC PANEL
ALT: 23 U/L (ref 0–44)
AST: 22 U/L (ref 15–41)
Albumin: 4.2 g/dL (ref 3.5–5.0)
Alkaline Phosphatase: 55 U/L (ref 38–126)
Anion gap: 6 (ref 5–15)
BUN: 9 mg/dL (ref 6–20)
CO2: 27 mmol/L (ref 22–32)
Calcium: 9 mg/dL (ref 8.9–10.3)
Chloride: 106 mmol/L (ref 98–111)
Creatinine, Ser: 0.68 mg/dL (ref 0.44–1.00)
GFR, Estimated: >60 mL/min (ref >60)
Glucose, Bld: 110 mg/dL — ABNORMAL HIGH (ref 70–99)
Potassium: 3.6 mmol/L (ref 3.5–5.1)
Sodium: 139 mmol/L (ref 135–145)
Total Bilirubin: 1 mg/dL (ref 0.3–1.2)
Total Protein: 7.2 g/dL (ref 6.5–8.1)

## 2021-01-05 LAB — HCG, QUANTITATIVE, PREGNANCY: hCG, Beta Chain, Quant, S: 378 m[IU]/mL — ABNORMAL HIGH (ref <5)

## 2021-01-05 MED ORDER — METHOTREXATE FOR ECTOPIC PREGNANCY
50.0000 mg/m2 | Freq: Once | INTRAMUSCULAR | Status: AC
  Administered 2021-01-05: 83 mg via INTRAMUSCULAR
  Filled 2021-01-05: qty 3.32

## 2021-01-05 NOTE — MAU Provider Note (Signed)
Event Date/Time   First Provider Initiated Contact with Patient 01/05/21 1450      S Ms. Annette Parks is a 29 y.o. G1P0 patient who presents to MAU today from the office for treatment for an ectopic pregnancy. She has intermittent right lower quadrant pain that she rates a 4/10. She reports a small amount of bleeding for the last 2 weeks. She is appropriately tearful regarding the diagnosis  O BP 133/85 (BP Location: Right Arm)   Pulse (!) 105   Temp 98.5 F (36.9 C) (Oral)   Resp 16   Ht '5\' 5"'$  (1.651 m)   Wt 60.1 kg   LMP 11/14/2020   SpO2 100%   BMI 22.05 kg/m  Physical Exam Vitals and nursing note reviewed.  Constitutional:      General: She is not in acute distress.    Appearance: She is well-developed.  HENT:     Head: Normocephalic.  Eyes:     Pupils: Pupils are equal, round, and reactive to light.  Cardiovascular:     Rate and Rhythm: Normal rate.  Pulmonary:     Effort: Pulmonary effort is normal. No respiratory distress.  Abdominal:     General: There is no distension.     Palpations: Abdomen is soft.     Tenderness: There is no abdominal tenderness.  Skin:    General: Skin is warm and dry.  Neurological:     Mental Status: She is alert and oriented to person, place, and time.  Psychiatric:        Mood and Affect: Mood normal.        Behavior: Behavior normal.        Thought Content: Thought content normal.        Judgment: Judgment normal.    A Medical screening exam complete CNM received report from Dr. Royston Sinner and will call with CMP results so MD can place order for MTX   P: MSE complete Care turned over to MD  Wende Mott, CNM 01/05/2021 2:50 PM

## 2021-01-05 NOTE — Progress Notes (Signed)
[facility] [address], [place], [place] [address] [phone]  Date:  01/08/2021   Name:  Annette Parks   DOB:  [birth year]   MRN:  [id number]  PCP:  Darreld Mclean, MD    Chief Complaint: Annual Exam   History of Present Illness:  Annette Parks is a 30 y.o. very pleasant female patient who presents with the following:  Pt seen today for an annual exam Last seen by myself about one year ago for a CPE  She has Hashimoto's thyroiditis, is managed by endocrinology, Dr. Conni Slipper currently on any medication  Married to Randall Hiss She works for [employer] neurology  Has GYN care with DR Alfred Levins Last visit with Dr Cruzita Lederer was in May of this year   Covid booster Pap- per her OBG- done in July  Unfortunately Timisha is currently being treated for ectopic pregnancy.  She tested positive for pregnancy in early August but shortly thereafter began having bleeding.  Her ectopic was just confirmed this last weekend and she was treated with a methotrexate injection  Right now her pain and bleeding is manageable, she feels that she is improving. Patient Active Problem List   Diagnosis Date Noted   Palpitations 01/18/2020   Closed fracture of fifth metacarpal bone of right hand 11/05/2019   Pain of right shoulder joint on movement 10/26/2019   Acute pain of right wrist 10/26/2019   Hashimoto's thyroiditis 09/04/2018   Hair loss 09/04/2018   Conductive hearing loss of right ear with unrestricted hearing of left ear 09/26/2017   Perforation of right tympanic membrane 09/26/2017   Hip pain, chronic, left 08/13/2017   Lower back pain 08/11/2017   Trochanteric bursitis of left hip 07/31/2017   Sacroiliac (ligament) sprain 07/31/2017   Common migraine 05/07/2016   Eye pain, bilateral 05/07/2016   Facial pain 05/07/2016   GAD (generalized anxiety disorder) 03/22/2015   Neck pain 07/11/2014   Myalgia 07/11/2014   Dysesthesia 07/11/2014     Past Medical History:  Diagnosis Date   Anal fissure    Hashimoto's disease    IBS (irritable bowel syndrome)    Migraine headache    Pelvic floor dysfunction?    Perforated eardrum    PONV (postoperative nausea and vomiting)    UTI (urinary tract infection)    Vaginal Pap smear, abnormal     Past Surgical History:  Procedure Laterality Date   EAR TUBE REMOVAL     TONSILLECTOMY AND ADENOIDECTOMY     TYMPANOPLASTY Right    TYMPANOSTOMY TUBE PLACEMENT     x 3   WISDOM TOOTH EXTRACTION      Social History   Tobacco Use   Smoking status: Never   Smokeless tobacco: Never  Vaping Use   Vaping Use: Never used  Substance Use Topics   Alcohol use: Not Currently    Comment: occasional-2 per month   Drug use: No    Family History  Problem Relation Age of Onset   Migraines Mother    Hashimoto's thyroiditis Mother    Endometriosis Mother    Hyperlipidemia Father    Diverticulitis Father        had to have part of colon removed   Asthma Brother    Diabetes Maternal Grandmother     No Known Allergies  Medication list has been reviewed and updated.  Current Outpatient Medications on File Prior to Visit  Medication Sig Dispense Refill  acetaminophen (TYLENOL) 500 MG tablet Take 1,000 mg by mouth every 6 (six) hours as needed.     loratadine (CLARITIN) 10 MG tablet Take 10 mg by mouth daily.     No current facility-administered medications on file prior to visit.    Review of Systems:  As per HPI- otherwise negative.   Physical Examination: Vitals:   01/08/21 1428  BP: 122/78  Pulse: 95  Resp: 16  Temp: (!) 97.3 F (36.3 C)  SpO2: 98%   Vitals:   01/08/21 1428  Weight: 132 lb (59.9 kg)  Height: '5\' 5"'$  (1.651 m)   Body mass index is 21.97 kg/m. Ideal Body Weight: Weight in (lb) to have BMI = 25: 149.9  GEN: no acute distress.  Normal weight, looks well.  She is understandably upset today HEENT: Atraumatic, Normocephalic.   TM wnl, PEERl Ears  and Nose: No external deformity. CV: RRR, No M/G/R. No JVD. No thrill. No extra heart sounds. PULM: CTA B, no wheezes, crackles, rhonchi. No retractions. No resp. distress. No accessory muscle use. ABD: S, NT, ND, +BS. No rebound. No HSM. EXTR: No c/c/e PSYCH: Normally interactive. Conversant.    Assessment and Plan: Physical exam  Patient seen today for physical exam.  She recently had blood work-CMP and CBC performed on September 9.  She has had quite a few blood draws recently, will do her cholesterol at a later date  Encouraged healthy diet and exercise routine  Suggested getting the valvular COVID booster in the future at her convenience  Offered support and condolences on her recent pregnancy loss  This visit occurred during the SARS-CoV-2 public health emergency.  Safety protocols were in place, including screening questions prior to the visit, additional usage of staff PPE, and extensive cleaning of exam room while observing appropriate contact time as indicated for disinfecting solutions.   Signed Lamar Blinks, MD

## 2021-01-05 NOTE — Progress Notes (Signed)
30 yo G1P0 presenting with right sided ectopic pregnancy for MTX.  She has been followed in our office for vaginal bleeding with inappropriately rising HCGs.  HCG trend: 46/102/147/110/239/294. She had an [place] today given the above findings and a 1.6cm x 1.4cm ectopic was seen.  She currently denies severe pain. She has had some mild cramping. She continues to have light bleeding/spotting.  She has been counseled regarding options in detail ab r/b of each and elects MTX. Given that she has HCG of <300, ectopic is <3cm, she appears clinically stable without a surgical abdomen and has minimal tenderness, she is a candidate for expectant management with MTX and has no contraindications to it. LFTs allow MTX administration. Risks of MTX discussed including failure and the possibility of needing [facility] anyway. Also discussed risk of needing emergent surgery/death if ectopic were to rupture. Also gave the option of surgical management with [facility] salpingectomy. Risks discussed including infection, bleeding, damage to surrounding structures, and the need for additional procedures. Patient declines surgery at this time and opts for MTX administration. The patient is reliable and understands the importance of strict follow up. Discussed following HCG until <5, pelvic rest until then, and the importance of not becoming pregnant as to skew HCG results. Dicussed the importance of supplementation with folic acid prior to becoming pregnant again. Discussed pelvic rest, avoidance of NSAIDs, avoidance of folic acid, avoidance of getting pregnant until HCG <5 AND for 3 months after MTX administration.  Patient understands the need to come immediately to hospital if she has heavy vaginal bleeding, worsening abdominal pain, syncope, pre-syncope, or dizziness. Lastly, discussed the need for Day 4 (Monday 9/12) in our office and Day 7 (Thursday 9/15) HCGs which we will follow in our office. Patient to make an appt Monday for HCG draw and  to se a provider in our office. All questions answered and pt desires to proceed with MTX.  Arty Baumgartner MD

## 2021-01-05 NOTE — Patient Instructions (Addendum)
Great to see you again today!  I am so sorry for your recent pregnancy loss- please let me know if there is anything we can do for you Please plan to get your flu shot later on this fall- I would also recommend getting the "bivalent" covid booster at your convenience

## 2021-01-05 NOTE — MAU Note (Addendum)
Has been followed in office.   On going bleeding last 2 wks.  Had sharp pains in RLQ.  Today on [place], dx with rt ectopic preg.  Mild cramping now.  Some nausea. Sent in for Methotrexate

## 2021-01-08 ENCOUNTER — Ambulatory Visit (INDEPENDENT_AMBULATORY_CARE_PROVIDER_SITE_OTHER): Payer: 59 | Admitting: Family Medicine

## 2021-01-08 ENCOUNTER — Other Ambulatory Visit: Payer: Self-pay

## 2021-01-08 ENCOUNTER — Encounter: Payer: Self-pay | Admitting: Family Medicine

## 2021-01-08 VITALS — BP 122/78 | HR 95 | Temp 97.3°F | Resp 16 | Ht 65.0 in | Wt 132.0 lb

## 2021-01-08 DIAGNOSIS — Z0000 Encounter for general adult medical examination without abnormal findings: Secondary | ICD-10-CM | POA: Diagnosis not present

## 2021-01-08 DIAGNOSIS — N911 Secondary amenorrhea: Secondary | ICD-10-CM | POA: Diagnosis not present

## 2021-01-11 ENCOUNTER — Other Ambulatory Visit: Payer: Self-pay

## 2021-01-11 ENCOUNTER — Inpatient Hospital Stay (HOSPITAL_COMMUNITY)
Admission: AD | Admit: 2021-01-11 | Discharge: 2021-01-11 | Disposition: A | Discharge status: Home / Self Care | Payer: 59 | Attending: Obstetrics and Gynecology | Admitting: Obstetrics and Gynecology

## 2021-01-11 DIAGNOSIS — O00101 Right tubal pregnancy without intrauterine pregnancy: Secondary | ICD-10-CM | POA: Diagnosis not present

## 2021-01-11 DIAGNOSIS — O009 Unspecified ectopic pregnancy without intrauterine pregnancy: Secondary | ICD-10-CM | POA: Diagnosis not present

## 2021-01-11 LAB — COMPREHENSIVE METABOLIC PANEL
ALT: 26 U/L (ref 0–44)
AST: 22 U/L (ref 15–41)
Albumin: 4.1 g/dL (ref 3.5–5.0)
Alkaline Phosphatase: 54 U/L (ref 38–126)
Anion gap: 7 (ref 5–15)
BUN: 9 mg/dL (ref 6–20)
CO2: 27 mmol/L (ref 22–32)
Calcium: 9.4 mg/dL (ref 8.9–10.3)
Chloride: 106 mmol/L (ref 98–111)
Creatinine, Ser: 0.78 mg/dL (ref 0.44–1.00)
GFR, Estimated: >60 mL/min (ref >60)
Glucose, Bld: 110 mg/dL — ABNORMAL HIGH (ref 70–99)
Potassium: 3.6 mmol/L (ref 3.5–5.1)
Sodium: 140 mmol/L (ref 135–145)
Total Bilirubin: 0.9 mg/dL (ref 0.3–1.2)
Total Protein: 6.8 g/dL (ref 6.5–8.1)

## 2021-01-11 MED ORDER — METHOTREXATE FOR ECTOPIC PREGNANCY
50.0000 mg/m2 | Freq: Once | INTRAMUSCULAR | Status: AC
  Administered 2021-01-11: 83 mg via INTRAMUSCULAR
  Filled 2021-01-11: qty 3.32

## 2021-01-11 NOTE — MAU Note (Signed)
Pt sent from office for ectopic pregnany. Stated she went to [facility] and had some clots come out and pain on right side is 4/10. Al ittle more than it was.  VSS.

## 2021-01-15 DIAGNOSIS — O009 Unspecified ectopic pregnancy without intrauterine pregnancy: Secondary | ICD-10-CM | POA: Diagnosis not present

## 2021-01-18 DIAGNOSIS — O009 Unspecified ectopic pregnancy without intrauterine pregnancy: Secondary | ICD-10-CM | POA: Diagnosis not present

## 2021-01-25 DIAGNOSIS — O009 Unspecified ectopic pregnancy without intrauterine pregnancy: Secondary | ICD-10-CM | POA: Diagnosis not present

## 2021-02-01 DIAGNOSIS — O009 Unspecified ectopic pregnancy without intrauterine pregnancy: Secondary | ICD-10-CM | POA: Diagnosis not present

## 2021-03-28 ENCOUNTER — Telehealth: Payer: 59 | Admitting: Physician Assistant

## 2021-03-28 DIAGNOSIS — B9689 Other specified bacterial agents as the cause of diseases classified elsewhere: Secondary | ICD-10-CM | POA: Diagnosis not present

## 2021-03-28 DIAGNOSIS — J019 Acute sinusitis, unspecified: Secondary | ICD-10-CM | POA: Diagnosis not present

## 2021-03-28 MED ORDER — AMOXICILLIN-POT CLAVULANATE 875-125 MG PO TABS: 1.0000 | ORAL_TABLET | Freq: Two times a day (BID) | ORAL | 0 refills | Status: DC

## 2021-03-28 NOTE — Progress Notes (Signed)
I have spent 5 minutes in review of e-visit questionnaire, review and updating patient chart, medical decision making and response to patient.   Westyn Driggers Cody Georgeann Brinkman, PA-C    

## 2021-03-28 NOTE — Progress Notes (Signed)

## 2021-06-02 ENCOUNTER — Telehealth: Payer: 59 | Admitting: Family

## 2021-06-02 DIAGNOSIS — R399 Unspecified symptoms and signs involving the genitourinary system: Secondary | ICD-10-CM

## 2021-06-03 MED ORDER — CEPHALEXIN 500 MG PO CAPS: 500.0000 mg | ORAL_CAPSULE | Freq: Two times a day (BID) | ORAL | 0 refills | Status: DC

## 2021-06-03 NOTE — Progress Notes (Signed)

## 2021-06-22 DIAGNOSIS — O009 Unspecified ectopic pregnancy without intrauterine pregnancy: Secondary | ICD-10-CM | POA: Diagnosis not present

## 2021-07-05 DIAGNOSIS — Z3141 Encounter for fertility testing: Secondary | ICD-10-CM | POA: Diagnosis not present

## 2021-07-31 DIAGNOSIS — O3680X9 Pregnancy with inconclusive fetal viability, other fetus: Secondary | ICD-10-CM | POA: Diagnosis not present

## 2021-07-31 DIAGNOSIS — N912 Amenorrhea, unspecified: Secondary | ICD-10-CM | POA: Diagnosis not present

## 2021-08-02 DIAGNOSIS — O3680X9 Pregnancy with inconclusive fetal viability, other fetus: Secondary | ICD-10-CM | POA: Diagnosis not present

## 2021-08-02 DIAGNOSIS — N912 Amenorrhea, unspecified: Secondary | ICD-10-CM | POA: Diagnosis not present

## 2021-08-17 DIAGNOSIS — N911 Secondary amenorrhea: Secondary | ICD-10-CM | POA: Diagnosis not present

## 2021-08-20 DIAGNOSIS — O3680X9 Pregnancy with inconclusive fetal viability, other fetus: Secondary | ICD-10-CM | POA: Diagnosis not present

## 2021-08-20 DIAGNOSIS — N911 Secondary amenorrhea: Secondary | ICD-10-CM | POA: Diagnosis not present

## 2021-08-23 DIAGNOSIS — N911 Secondary amenorrhea: Secondary | ICD-10-CM | POA: Diagnosis not present

## 2021-08-27 DIAGNOSIS — N911 Secondary amenorrhea: Secondary | ICD-10-CM | POA: Diagnosis not present

## 2021-08-27 DIAGNOSIS — O3680X9 Pregnancy with inconclusive fetal viability, other fetus: Secondary | ICD-10-CM | POA: Diagnosis not present

## 2021-08-29 DIAGNOSIS — O3680X9 Pregnancy with inconclusive fetal viability, other fetus: Secondary | ICD-10-CM | POA: Diagnosis not present

## 2021-08-31 DIAGNOSIS — N911 Secondary amenorrhea: Secondary | ICD-10-CM | POA: Diagnosis not present

## 2021-09-04 ENCOUNTER — Other Ambulatory Visit (HOSPITAL_COMMUNITY): Payer: Self-pay

## 2021-09-04 DIAGNOSIS — Z129 Encounter for screening for malignant neoplasm, site unspecified: Secondary | ICD-10-CM | POA: Diagnosis not present

## 2021-09-04 DIAGNOSIS — L700 Acne vulgaris: Secondary | ICD-10-CM | POA: Diagnosis not present

## 2021-09-04 DIAGNOSIS — D485 Neoplasm of uncertain behavior of skin: Secondary | ICD-10-CM | POA: Diagnosis not present

## 2021-09-04 DIAGNOSIS — D225 Melanocytic nevi of trunk: Secondary | ICD-10-CM | POA: Diagnosis not present

## 2021-09-04 MED ORDER — AZELAIC ACID 15 % EX GEL
CUTANEOUS | 3 refills | Status: DC
  Filled 2021-09-04 – 2021-09-21 (×2): qty 50, 30d supply, fill #0

## 2021-09-06 DIAGNOSIS — O020 Blighted ovum and nonhydatidiform mole: Secondary | ICD-10-CM | POA: Diagnosis not present

## 2021-09-06 DIAGNOSIS — O039 Complete or unspecified spontaneous abortion without complication: Secondary | ICD-10-CM | POA: Diagnosis not present

## 2021-09-06 DIAGNOSIS — O021 Missed abortion: Secondary | ICD-10-CM | POA: Diagnosis not present

## 2021-09-06 HISTORY — PX: DILATION AND EVACUATION: SHX1459

## 2021-09-12 ENCOUNTER — Other Ambulatory Visit (HOSPITAL_COMMUNITY): Payer: Self-pay

## 2021-09-12 DIAGNOSIS — D235 Other benign neoplasm of skin of trunk: Secondary | ICD-10-CM | POA: Diagnosis not present

## 2021-09-18 ENCOUNTER — Ambulatory Visit: Payer: 59 | Admitting: Internal Medicine

## 2021-09-18 ENCOUNTER — Encounter: Payer: Self-pay | Admitting: Internal Medicine

## 2021-09-18 VITALS — BP 110/76 | HR 95 | Ht 65.0 in | Wt 141.8 lb

## 2021-09-18 DIAGNOSIS — E063 Autoimmune thyroiditis: Secondary | ICD-10-CM

## 2021-09-18 NOTE — Progress Notes (Unsigned)
Patient ID: Annette Parks, female   DOB: Apr 24, 1991, 31 y.o.   MRN: [id number]   This visit occurred during the SARS-CoV-2 public health emergency.  Safety protocols were in place, including screening questions prior to the visit, additional usage of staff PPE, and extensive cleaning of exam room while observing appropriate contact time as indicated for disinfecting solutions.   HPI  Annette Parks is a 31 y.o.-year-old very pleasant female, initially referred by her PCP, Dr. Edilia Bo, returning for follow-up for Hashimoto's thyroiditis.  Last visit 1 year ago. Her mother, Annette Parks, is also my patient.  Interim hx: She had an ectopic pregnancy 11/2020 for which she had to get methotrexate. More recently, she had a miscarriage at 9 weeks, earlier this mo, for which she had to have a D and C.  She is still bleeding. She has some fatigue and weight gain. Had nausea with the recent pregnancy >> now feeling better.   She recently had a abnormal mole removed from her back - dermatology: Dr. Janna Arch  Reviewed history: She was diagnosed with Hashimoto's thyroiditis in 07/2018.  She has normal thyroid tests so she did not require levothyroxine.  Reviewed her TFTs: Lab Results  Component Value Date   TSH 1.60 09/18/2020   TSH 1.67 12/27/2019   TSH 1.35 09/13/2019   TSH 1.58 03/08/2019   TSH 1.66 08/20/2018   TSH 1.73 08/05/2016   TSH 1.30 02/16/2016   TSH 1.529 03/22/2015   FREET4 0.90 09/18/2020   FREET4 0.80 09/13/2019   FREET4 1.02 03/08/2019   Her thyroid antibodies were elevated: Component     Latest Ref Rng 09/18/2020  Thyroglobulin Ab     < or = 1 IU/mL 1   Thyroperoxidase Ab SerPl-aCnc     <9 IU/mL 94 (H)     Component     Latest Ref Rng & Units 09/13/2019  Thyroperoxidase Ab SerPl-aCnc     <9 IU/mL 53 (H)   Component     Latest Ref Rng & Units 03/22/2015 08/20/2018 03/08/2019  Thyroperoxidase Ab SerPl-aCnc     <9 IU/mL 2 23 (H) 46 (H)  Thyroglobulin Ab     < or = 1 IU/mL  1     We started selenium 200 mcg daily in 08/2018, but her TPO antibodies were still elevated at last check so I advised her that selenium was not mandatory, so she stopped it.  She previously had mild weight gain, fatigue, cold intolerance, anxiety, constipation, hair thinning and loss.  These improved.  Her hair loss improved after starting hair skin and nails vitamins.  Then switched to prenatal vitamins.  Pt denies: - feeling nodules in neck - hoarseness - dysphagia - choking - SOB with lying down  She has + FH of thyroid disorders in: mother, MGM. No FH of thyroid cancer. No h/o radiation tx to head or neck. No herbal supplements. No Biotin use now. No recent steroids use.   Pt. also has a history of Raynauds phenomenon. She is on OCPs - since a teenager. She tried to stop x 8 mo restarted b/c acne and also dysmenorrhea. Now on 3 consecutive months. She also has a history of heart valve insufficiency. She has neck, lower back pain, hip pain; she was diagnosed with spina bifida occulta. She has constipation (chronic) and anal fissures. She has hyperlipidemia She got married since last visit.  No plans for children the first 2 years of marriage. She was on Amitriptyline for bladder dysfxn.  Previously had palpitations found to be PVCs, by cardiology.  No recent episodes.  She works at [employer] neurology.  ROS: + See HPI  I reviewed pt's medications, allergies, PMH, social hx, family hx, and changes were documented in the history of present illness. Otherwise, unchanged from my initial visit note.  Past Medical History:  Diagnosis Date   Anal fissure    Hashimoto's disease    IBS (irritable bowel syndrome)    Migraine headache    Pelvic floor dysfunction?    Perforated eardrum    PONV (postoperative nausea and vomiting)    UTI (urinary tract infection)    Vaginal Pap smear, abnormal    Past Surgical History:  Procedure Laterality Date   EAR TUBE REMOVAL      TONSILLECTOMY AND ADENOIDECTOMY     TYMPANOPLASTY Right    TYMPANOSTOMY TUBE PLACEMENT     x 3   WISDOM TOOTH EXTRACTION     Social History   Socioeconomic History   Marital status: Married    Spouse name: Not on file   Number of children: 0   Years of education: Not on file   Highest education level: Not on file  Occupational History   Occupation: Registered nurse  Tobacco Use   Smoking status: Never   Smokeless tobacco: Never  Vaping Use   Vaping Use: Never used  Substance and Sexual Activity   Alcohol use: Not Currently    Comment: occasional-2 per month   Drug use: No   Sexual activity: Not Currently  Other Topics Concern   Not on file  Social History Narrative   Single, no children    moved here from [place] went to [facility]. Moved here after her parents moved here.   Clinic nurse in [employer]   Social Determinants of Health   Financial Resource Strain: Not on file  Food Insecurity: Not on file  Transportation Needs: Not on file  Physical Activity: Not on file  Stress: Not on file  Social Connections: Not on file  Intimate Partner Violence: Not on file   Current Outpatient Medications on File Prior to Visit  Medication Sig Dispense Refill   acetaminophen (TYLENOL) 500 MG tablet Take 1,000 mg by mouth every 6 (six) hours as needed.     amoxicillin-clavulanate (AUGMENTIN) 875-125 MG tablet Take 1 tablet by mouth 2 (two) times daily. 14 tablet 0   Azelaic Acid (FINACEA) 15 % gel Apply topically 2 times a day to the areas of acne 50 g 3   cephALEXin (KEFLEX) 500 MG capsule Take 1 capsule (500 mg total) by mouth 2 (two) times daily. 14 capsule 0   loratadine (CLARITIN) 10 MG tablet Take 10 mg by mouth daily.     No current facility-administered medications on file prior to visit.   No Known Allergies Family History  Problem Relation Age of Onset   Migraines Mother    Hashimoto's thyroiditis Mother    Endometriosis  Mother    Hyperlipidemia Father    Diverticulitis Father        had to have part of colon removed   Asthma Brother    Diabetes Maternal Grandmother     PE: BP 110/76 (BP Location: Right Arm, Patient Position: Sitting, Cuff Size: Normal)   Pulse 95   Ht '5\' 5"'$  (1.651 m)   Wt 141 lb 12.8 oz (64.3 kg)   LMP 11/14/2020   SpO2 99%   BMI 23.60 kg/m  Wt Readings from  Last 3 Encounters:  09/18/21 141 lb 12.8 oz (64.3 kg)  01/08/21 132 lb (59.9 kg)  01/05/21 132 lb 8 oz (60.1 kg)   Constitutional: Normal weight, in NAD Eyes: PERRLA, EOMI, no exophthalmos ENT: moist mucous membranes, no thyromegaly, no cervical lymphadenopathy Cardiovascular: Tachycardia, RR, No MRG Respiratory: CTA B Musculoskeletal: no deformities, strength intact in all 4 Skin: moist, warm, no rashes Neurological: no tremor with outstretched hands, DTR normal in all 4  ASSESSMENT: 1. Hashimoto thyroiditis   PLAN: 1. Patient with history of Hashimoto's thyroiditis, not on levothyroxine yet - she appears euthyroid.  - she had an ectopic pregnancy last year and a recent miscarriage - she does not appear to have a goiter, thyroid nodules, or neck compression symptoms.  She previously had neck fullness after her COVID vaccines (but not after COVID infection), likely due to an inflamed lymph node, but this resolved - latest thyroid labs reviewed with pt. >> normal: Lab Results  Component Value Date   TSH 1.60 09/18/2020  - she tried selenium in the past but this did not help decreasing her TPO antibodies so we stopped.  I would not suggest to restart it now - we previously discussed about how to take levothyroxine in case we need to start: every day, with water, >30 minutes before breakfast, separated by >4 hours from acid reflux medications, calcium, iron, multivitamins. Pt. is taking it correctly. - will check thyroid tests today: TSH, free T3 and fT4.  Due to the recent miscarriage, I will also recheck her  Hashimoto's antibodies. - If labs are abnormal, she will need to return for repeat TFTs in 1.5 months - OTW, RTC in 1 year  Philemon Kingdom, MD PhD [facility]

## 2021-09-18 NOTE — Patient Instructions (Signed)
Please stop at the lab.  Please come back for a follow-up appointment in 1 year.  

## 2021-09-19 LAB — T4, FREE: Free T4: 0.96 ng/dL (ref 0.60–1.60)

## 2021-09-19 LAB — THYROGLOBULIN ANTIBODY: Thyroglobulin Ab: 1 IU/mL (ref <=1)

## 2021-09-19 LAB — T3, FREE: T3, Free: 3.4 pg/mL (ref 2.3–4.2)

## 2021-09-19 LAB — TSH: TSH: 1.96 u[IU]/mL (ref 0.35–5.50)

## 2021-09-19 LAB — THYROID PEROXIDASE ANTIBODY: Thyroperoxidase Ab SerPl-aCnc: 346 IU/mL — ABNORMAL HIGH (ref <9)

## 2021-09-21 ENCOUNTER — Other Ambulatory Visit (HOSPITAL_COMMUNITY): Payer: Self-pay

## 2021-10-01 ENCOUNTER — Emergency Department (INDEPENDENT_AMBULATORY_CARE_PROVIDER_SITE_OTHER)
Admission: RE | Admit: 2021-10-01 | Discharge: 2021-10-01 | Disposition: A | Discharge status: Home / Self Care | Payer: 59 | Source: Ambulatory Visit

## 2021-10-01 VITALS — BP 130/96 | HR 104 | Temp 99.7°F | Resp 15 | Ht 65.0 in | Wt 140.0 lb

## 2021-10-01 DIAGNOSIS — J029 Acute pharyngitis, unspecified: Secondary | ICD-10-CM | POA: Diagnosis not present

## 2021-10-01 LAB — POCT RAPID STREP A (OFFICE): Rapid Strep A Screen: NEGATIVE

## 2021-10-01 MED ORDER — AZITHROMYCIN 250 MG PO TABS: ORAL_TABLET | ORAL | 0 refills | Status: DC

## 2021-10-01 MED ORDER — PREDNISONE 10 MG PO TABS: 10.0000 mg | ORAL_TABLET | Freq: Every day | ORAL | 0 refills | Status: AC

## 2021-10-01 NOTE — ED Provider Notes (Addendum)
[facility]    CSN: [id number] Arrival date & time: 10/01/21  1240      History   Chief Complaint Chief Complaint  Patient presents with   Sore Throat    HPI Annette Parks is a 31 y.o. female.   HPI Patient with a history tonsillectomy presents with a 3 days history of worsening sore throat. Throat pain is worsened by talking, swallowing, and lying down. She has not had fever. Endorses feeling aching and not at baselines. No recent sick exposures.Patient is currently not pregnant.  Past Medical History:  Diagnosis Date   Anal fissure    Hashimoto's disease    IBS (irritable bowel syndrome)    Migraine headache    Pelvic floor dysfunction?    Perforated eardrum    PONV (postoperative nausea and vomiting)    UTI (urinary tract infection)    Vaginal Pap smear, abnormal     Patient Active Problem List   Diagnosis Date Noted   Palpitations 01/18/2020   Closed fracture of fifth metacarpal bone of right hand 11/05/2019   Pain of right shoulder joint on movement 10/26/2019   Acute pain of right wrist 10/26/2019   Hashimoto's thyroiditis 09/04/2018   Hair loss 09/04/2018   Conductive hearing loss of right ear with unrestricted hearing of left ear 09/26/2017   Perforation of right tympanic membrane 09/26/2017   Hip pain, chronic, left 08/13/2017   Lower back pain 08/11/2017   Trochanteric bursitis of left hip 07/31/2017   Sacroiliac (ligament) sprain 07/31/2017   Common migraine 05/07/2016   Eye pain, bilateral 05/07/2016   Facial pain 05/07/2016   GAD (generalized anxiety disorder) 03/22/2015   Neck pain 07/11/2014   Myalgia 07/11/2014   Dysesthesia 07/11/2014    Past Surgical History:  Procedure Laterality Date   DILATION AND EVACUATION  09/06/2021   EAR TUBE REMOVAL     TONSILLECTOMY AND ADENOIDECTOMY     TYMPANOPLASTY Right    TYMPANOSTOMY TUBE PLACEMENT     x 3   WISDOM TOOTH EXTRACTION      OB History     Gravida  1   Para      Term       Preterm      AB      Living         SAB      IAB      Ectopic      Multiple      Live Births               Home Medications    Prior to Admission medications   Medication Sig Start Date End Date Taking? Authorizing Provider  azithromycin (ZITHROMAX) 250 MG tablet Take 2 tabs PO x 1 dose, then 1 tab PO QD x 4 days 10/01/21  Yes Scot Jun, FNP  predniSONE (DELTASONE) 10 MG tablet Take 1 tablet (10 mg total) by mouth daily with breakfast for 5 days. 10/01/21 10/06/21 Yes Scot Jun, FNP  acetaminophen (TYLENOL) 500 MG tablet Take 1,000 mg by mouth every 6 (six) hours as needed.    [provider]  Azelaic Acid (FINACEA) 15 % gel Apply topically 2 times a day to the areas of acne 09/04/21     loratadine (CLARITIN) 10 MG tablet Take 10 mg by mouth daily.    [provider]    Family History Family History  Problem Relation Age of Onset   Migraines Mother    Hashimoto's  thyroiditis Mother    Endometriosis Mother    Hyperlipidemia Father    Diverticulitis Father        had to have part of colon removed   Asthma Brother    Diabetes Maternal Grandmother     Social History Social History   Tobacco Use   Smoking status: Never   Smokeless tobacco: Never  Vaping Use   Vaping Use: Never used  Substance Use Topics   Alcohol use: Not Currently    Comment: occasional-2 per month   Drug use: No     Allergies   Patient has no known allergies.   Review of Systems Review of Systems Pertinent negatives listed in HPI   Physical Exam Triage Vital Signs ED Triage Vitals  Enc Vitals Group     BP 10/01/21 1338 (!) 130/96     Pulse Rate 10/01/21 1338 (!) 104     Resp 10/01/21 1338 15     Temp 10/01/21 1338 99.7 F (37.6 C)     Temp Source 10/01/21 1338 Oral     SpO2 10/01/21 1338 100 %     Weight 10/01/21 1340 140 lb (63.5 kg)     Height 10/01/21 1340 '5\' 5"'$  (1.651 m)     Head Circumference --      Peak Flow --      Pain Score  10/01/21 1340 4     Pain Loc --      Pain Edu? --      Excl. in [place]? --    No data found.  Updated Vital Signs BP (!) 130/96 (BP Location: Left Arm)   Pulse (!) 104   Temp 99.7 F (37.6 C) (Oral)   Resp 15   Ht '5\' 5"'$  (1.651 m)   Wt 140 lb (63.5 kg)   LMP 11/14/2020 Comment: D & E 09/06/21 - still bleeding from procedure  SpO2 100%   Breastfeeding Unknown   BMI 23.30 kg/m   Visual Acuity Right Eye Distance:   Left Eye Distance:   Bilateral Distance:    Right Eye Near:   Left Eye Near:    Bilateral Near:     Physical Exam General Appearance:    Alert, acutely ill appearing, cooperative, no distress  HENT:   Normocephalic, neck has bilateral anterior cervical nodes enlarged, pharynx erythematous without exudate, and tonsils red, enlarged, without exudate present, nares patent without edema or rhinorrhea   Eyes:    PERRL, conjunctiva/corneas clear, EOM's intact       Lungs:     Clear to auscultation bilaterally, respirations unlabored  Heart:    Regular rate and rhythm  Neurologic:   Awake, alert, oriented x 3. No apparent focal neurological           defect.         UC Treatments / Results  Labs (all labs ordered are listed, but only abnormal results are displayed) Labs Reviewed  CULTURE, GROUP A STREP   Narrative:    Performed at:  [address] [place], [place]  [id number] Lab Director: Rush Farmer MD, Phone:  [phone]  POCT RAPID STREP A (OFFICE)    EKG   Radiology No results found.  Procedures Procedures (including critical care time)  Medications Ordered in UC Medications - No data to display  Initial Impression / Assessment and Plan / UC Course  I have reviewed the triage vital signs and the nursing notes.  Pertinent labs & imaging results that were available  during my care of the patient were reviewed by me and considered in my medical decision making (see chart for details).    Acute pharyngitis,rapid strep  negative. Throat culture pending. Given appearance of throat, treating for suspected strep infection. Azithromycin and prednisone for oropharyngeal swelling.  Return for care if symptoms worsen or do not improve. Final Clinical Impressions(s) / UC Diagnoses   Final diagnoses:  Acute pharyngitis, unspecified etiology   Discharge Instructions   None    ED Prescriptions     Medication Sig Dispense Auth. Provider   azithromycin (ZITHROMAX) 250 MG tablet Take 2 tabs PO x 1 dose, then 1 tab PO QD x 4 days 6 tablet Scot Jun, FNP   predniSONE (DELTASONE) 10 MG tablet Take 1 tablet (10 mg total) by mouth daily with breakfast for 5 days. 5 tablet Scot Jun, FNP      PDMP not reviewed this encounter.   Scot Jun, FNP 10/05/21 [place], [place], [place] 10/05/21 [phone]

## 2021-10-01 NOTE — ED Triage Notes (Signed)
Sore throat x  3 days  Dayquil & nyquil  Negative Home Covid test yesterday tonsillectomy  Pain with talking  Minor sinus drainage  Denies fever

## 2021-10-02 ENCOUNTER — Ambulatory Visit: Payer: 59 | Admitting: Family Medicine

## 2021-10-04 LAB — CULTURE, GROUP A STREP: Strep A Culture: NEGATIVE

## 2021-11-16 ENCOUNTER — Other Ambulatory Visit (HOSPITAL_COMMUNITY): Payer: Self-pay

## 2021-11-16 MED ORDER — PROGESTERONE MICRONIZED 100 MG PO CAPS
100.0000 mg | ORAL_CAPSULE | Freq: Every day | ORAL | 0 refills | Status: DC
  Filled 2021-11-16 (×2): qty 10, 10d supply, fill #0

## 2021-12-21 DIAGNOSIS — D225 Melanocytic nevi of trunk: Secondary | ICD-10-CM | POA: Diagnosis not present

## 2021-12-21 DIAGNOSIS — D229 Melanocytic nevi, unspecified: Secondary | ICD-10-CM | POA: Diagnosis not present

## 2022-01-06 NOTE — Progress Notes (Addendum)
[place] [facility] [address] [place], [place] [address] [phone]  Date:  01/09/2022   Name:  Annette Parks   DOB:  01/24/1991   MRN:  [id number]  PCP:  Darreld Mclean, MD    Chief Complaint: Annual Exam (Concerns/ questions: none/Pap: last year [facility]/Flu shot today: work/)   History of Present Illness:  Annette Parks is a 31 y.o. very pleasant female patient who presents with the following:  Patient seen today for physical exam Most recent visit with myself about 1 year ago also for physical Hashimoto's thyroiditis, managed by Dr. 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 Her gynecologist is Dr. Alfred Levins At her visit last year Annette Parks was currently being treated for an ectopic pregnancy Unfortunately, per notes it appears she has had more issues with pregnancy loss with a blighted ovum in May She did not get her menses back after her D&C and finally took progesterone to re-start menses They are continuing to try and get pregnant- she is in her waiting to see if she might be pregnant right now -she hopes so!  Offered support and encouragement  Jerome notes she has gained a few pounds since last year, likely due to her thyroid and fertility issues.  She admits to being stressed, but does not feel that she is significantly depressed and does not wish to start medication right now  Wt Readings from Last 3 Encounters:  01/09/22 144 lb 3.2 oz (65.4 kg)  10/01/21 140 lb (63.5 kg)  09/18/21 141 lb 12.8 oz (64.3 kg)    Pap smear- done last year, normal  Flu vaccine-she plans to do at work this fall Lab work on chart from about 1 year ago-she will update labs today  She has been pretty stressed out at work- she is at [employer] and they are having some turnover  Patient Active Problem List    Diagnosis Date Noted   Palpitations 01/18/2020   Closed fracture of fifth metacarpal bone of right hand 11/05/2019   Pain of right shoulder joint on movement 10/26/2019   Acute pain of right wrist 10/26/2019   Hashimoto's thyroiditis 09/04/2018   Hair loss 09/04/2018   Conductive hearing loss of right ear with unrestricted hearing of left ear 09/26/2017   Perforation of right tympanic membrane 09/26/2017   Hip pain, chronic, left 08/13/2017   Lower back pain 08/11/2017   Trochanteric bursitis of left hip 07/31/2017   Sacroiliac (ligament) sprain 07/31/2017   Common migraine 05/07/2016   Eye pain, bilateral 05/07/2016   Facial pain 05/07/2016   GAD (generalized anxiety disorder) 03/22/2015   Neck pain 07/11/2014   Myalgia 07/11/2014   Dysesthesia 07/11/2014    Past Medical History:  Diagnosis Date   Anal fissure    Hashimoto's disease    IBS (irritable bowel syndrome)    Migraine headache    Pelvic floor dysfunction?    Perforated eardrum    PONV (postoperative nausea and vomiting)    UTI (urinary tract infection)    Vaginal Pap smear, abnormal     Past Surgical History:  Procedure Laterality Date   DILATION AND EVACUATION  09/06/2021   EAR TUBE REMOVAL     TONSILLECTOMY AND ADENOIDECTOMY     TYMPANOPLASTY Right    TYMPANOSTOMY TUBE PLACEMENT     x 3  WISDOM TOOTH EXTRACTION      Social History   Tobacco Use   Smoking status: Never   Smokeless tobacco: Never  Vaping Use   Vaping Use: Never used  Substance Use Topics   Alcohol use: Not Currently    Comment: occasional-2 per month   Drug use: No    Family History  Problem Relation Age of Onset   Migraines Mother    Hashimoto's thyroiditis Mother    Endometriosis Mother    Hyperlipidemia Father    Diverticulitis Father        had to have part of colon removed   Asthma Brother    Diabetes Maternal Grandmother     No Known Allergies  Medication list has been reviewed and updated.  Current  Outpatient Medications on File Prior to Visit  Medication Sig Dispense Refill   Azelaic Acid (FINACEA) 15 % gel Apply topically 2 times a day to the areas of acne 50 g 3   loratadine (CLARITIN) 10 MG tablet Take 10 mg by mouth daily.     Prenatal Vit-Fe Fumarate-FA (PRENATAL VITAMIN PO)      No current facility-administered medications on file prior to visit.    Review of Systems:  As per HPI- otherwise negative.   Physical Examination: Vitals:   01/09/22 1443  BP: 114/70  Pulse: (!) 105  Resp: 18  Temp: 98.1 F (36.7 C)  SpO2: 99%   Vitals:   01/09/22 1443  Weight: 144 lb 3.2 oz (65.4 kg)  Height: '5\' 5"'$  (1.651 m)   Body mass index is 24 kg/m. Ideal Body Weight: Weight in (lb) to have BMI = 25: 149.9  GEN: no acute distress.  Normal weight, looks well HEENT: Atraumatic, Normocephalic.  Bilateral TM wnl, oropharynx normal.  PEERL,EOMI.   Ears and Nose: No external deformity. CV: RRR, No M/G/R. No JVD. No thrill. No extra heart sounds. PULM: CTA B, no wheezes, crackles, rhonchi. No retractions. No resp. distress. No accessory muscle use. ABD: S, NT, ND, +BS. No rebound. No HSM. EXTR: No c/c/e PSYCH: Normally interactive. Conversant.   Pulse Readings from Last 3 Encounters:  01/09/22 (!) 105  10/01/21 (!) 104  09/18/21 95    Assessment and Plan: Physical exam  Screening for deficiency anemia - Plan: CBC  Screening for diabetes mellitus - Plan: Comprehensive metabolic panel, Hemoglobin A1c  Screening for hyperlipidemia - Plan: Lipid panel  Encounter for vitamin deficiency screening - Plan: VITAMIN D 25 Hydroxy (Vit-D Deficiency, Fractures)  Hashimoto's thyroiditis - Plan: TSH, T3, free   Physical exam today.  Encouraged healthy diet and exercise routine Will plan further follow- up pending labs.  Signed Lamar Blinks, MD Received her labs 9/14- message to pt  Results for orders placed or performed in visit on 01/09/22  CBC  Result Value Ref Range    WBC 8.2 4.0 - 10.5 K/uL   RBC 5.14 (H) 3.87 - 5.11 Mil/uL   Platelets 296.0 150.0 - 400.0 K/uL   Hemoglobin 14.2 12.0 - 15.0 g/dL   HCT 43.1 36.0 - 46.0 %   MCV 83.8 78.0 - 100.0 fl   MCHC 33.0 30.0 - 36.0 g/dL   RDW 13.6 11.5 - 15.5 %  Comprehensive metabolic panel  Result Value Ref Range   Sodium 140 135 - 145 mEq/L   Potassium 4.1 3.5 - 5.1 mEq/L   Chloride 103 96 - 112 mEq/L   CO2 28 19 - 32 mEq/L   Glucose, Bld 77 70 - 99  mg/dL   BUN 14 6 - 23 mg/dL   Creatinine, Ser 0.74 0.40 - 1.20 mg/dL   Total Bilirubin 0.7 0.2 - 1.2 mg/dL   Alkaline Phosphatase 82 39 - 117 U/L   AST 14 0 - 37 U/L   ALT 15 0 - 35 U/L   Total Protein 7.4 6.0 - 8.3 g/dL   Albumin 4.6 3.5 - 5.2 g/dL   GFR 107.91 >60.00 mL/min   Calcium 9.8 8.4 - 10.5 mg/dL  Hemoglobin A1c  Result Value Ref Range   Hgb A1c MFr Bld 5.6 4.6 - 6.5 %  Lipid panel  Result Value Ref Range   Cholesterol 167 0 - 200 mg/dL   Triglycerides 106.0 0.0 - 149.0 mg/dL   HDL 65.40 >39.00 mg/dL   VLDL 21.2 0.0 - 40.0 mg/dL   LDL Cholesterol 81 0 - 99 mg/dL   Total CHOL/HDL Ratio 3    NonHDL 101.92   VITAMIN D 25 Hydroxy (Vit-D Deficiency, Fractures)  Result Value Ref Range   VITD 26.76 (L) 30.00 - 100.00 ng/mL  TSH  Result Value Ref Range   TSH 2.67 0.35 - 5.50 uIU/mL  T3, free  Result Value Ref Range   T3, Free 3.4 2.3 - 4.2 pg/mL

## 2022-01-06 NOTE — Patient Instructions (Signed)
It was great to see you again today Best of luck!!  We will be thinking of you, please keep me posted Flu shot this fall I will be in touch with your labs

## 2022-01-09 ENCOUNTER — Ambulatory Visit (INDEPENDENT_AMBULATORY_CARE_PROVIDER_SITE_OTHER): Payer: 59 | Admitting: Family Medicine

## 2022-01-09 ENCOUNTER — Encounter: Payer: Self-pay | Admitting: Family Medicine

## 2022-01-09 VITALS — BP 114/70 | HR 95 | Temp 98.1°F | Resp 18 | Ht 65.0 in | Wt 144.2 lb

## 2022-01-09 DIAGNOSIS — E063 Autoimmune thyroiditis: Secondary | ICD-10-CM | POA: Diagnosis not present

## 2022-01-09 DIAGNOSIS — Z0000 Encounter for general adult medical examination without abnormal findings: Secondary | ICD-10-CM

## 2022-01-09 DIAGNOSIS — Z131 Encounter for screening for diabetes mellitus: Secondary | ICD-10-CM | POA: Diagnosis not present

## 2022-01-09 DIAGNOSIS — Z130 Encounter for screening for diseases of the blood and blood-forming organs and certain disorders involving the immune mechanism: Secondary | ICD-10-CM

## 2022-01-09 DIAGNOSIS — Z1321 Encounter for screening for nutritional disorder: Secondary | ICD-10-CM

## 2022-01-09 DIAGNOSIS — Z13220 Encounter for screening for lipoid disorders: Secondary | ICD-10-CM

## 2022-01-10 ENCOUNTER — Encounter: Payer: Self-pay | Admitting: Family Medicine

## 2022-01-10 LAB — COMPREHENSIVE METABOLIC PANEL
ALT: 15 U/L (ref 0–35)
AST: 14 U/L (ref 0–37)
Albumin: 4.6 g/dL (ref 3.5–5.2)
Alkaline Phosphatase: 82 U/L (ref 39–117)
BUN: 14 mg/dL (ref 6–23)
CO2: 28 mEq/L (ref 19–32)
Calcium: 9.8 mg/dL (ref 8.4–10.5)
Chloride: 103 mEq/L (ref 96–112)
Creatinine, Ser: 0.74 mg/dL (ref 0.40–1.20)
GFR: 107.91 mL/min (ref >60.00)
Glucose, Bld: 77 mg/dL (ref 70–99)
Potassium: 4.1 mEq/L (ref 3.5–5.1)
Sodium: 140 mEq/L (ref 135–145)
Total Bilirubin: 0.7 mg/dL (ref 0.2–1.2)
Total Protein: 7.4 g/dL (ref 6.0–8.3)

## 2022-01-10 LAB — CBC
HCT: 43.1 % (ref 36.0–46.0)
Hemoglobin: 14.2 g/dL (ref 12.0–15.0)
MCHC: 33 g/dL (ref 30.0–36.0)
MCV: 83.8 fl (ref 78.0–100.0)
Platelets: 296 10*3/uL (ref 150.0–400.0)
RBC: 5.14 Mil/uL — ABNORMAL HIGH (ref 3.87–5.11)
RDW: 13.6 % (ref 11.5–15.5)
WBC: 8.2 10*3/uL (ref 4.0–10.5)

## 2022-01-10 LAB — VITAMIN D 25 HYDROXY (VIT D DEFICIENCY, FRACTURES): VITD: 26.76 ng/mL — ABNORMAL LOW (ref 30.00–100.00)

## 2022-01-10 LAB — LIPID PANEL
Cholesterol: 167 mg/dL (ref 0–200)
HDL: 65.4 mg/dL (ref >39.00)
LDL Cholesterol: 81 mg/dL (ref 0–99)
NonHDL: 101.92
Total CHOL/HDL Ratio: 3
Triglycerides: 106 mg/dL (ref 0.0–149.0)
VLDL: 21.2 mg/dL (ref 0.0–40.0)

## 2022-01-10 LAB — HEMOGLOBIN A1C: Hgb A1c MFr Bld: 5.6 % (ref 4.6–6.5)

## 2022-01-10 LAB — T3, FREE: T3, Free: 3.4 pg/mL (ref 2.3–4.2)

## 2022-01-10 LAB — TSH: TSH: 2.67 u[IU]/mL (ref 0.35–5.50)

## 2022-01-11 DIAGNOSIS — Z01419 Encounter for gynecological examination (general) (routine) without abnormal findings: Secondary | ICD-10-CM | POA: Diagnosis not present

## 2022-01-11 DIAGNOSIS — Z124 Encounter for screening for malignant neoplasm of cervix: Secondary | ICD-10-CM | POA: Diagnosis not present

## 2022-01-11 DIAGNOSIS — R319 Hematuria, unspecified: Secondary | ICD-10-CM | POA: Diagnosis not present

## 2022-01-11 DIAGNOSIS — Z6823 Body mass index (BMI) 23.0-23.9, adult: Secondary | ICD-10-CM | POA: Diagnosis not present

## 2022-01-14 ENCOUNTER — Encounter: Payer: 59 | Admitting: Family Medicine

## 2022-02-11 DIAGNOSIS — Z319 Encounter for procreative management, unspecified: Secondary | ICD-10-CM | POA: Diagnosis not present

## 2022-02-11 DIAGNOSIS — Z139 Encounter for screening, unspecified: Secondary | ICD-10-CM | POA: Diagnosis not present

## 2022-02-18 DIAGNOSIS — N912 Amenorrhea, unspecified: Secondary | ICD-10-CM | POA: Diagnosis not present

## 2022-02-20 DIAGNOSIS — N912 Amenorrhea, unspecified: Secondary | ICD-10-CM | POA: Diagnosis not present

## 2022-03-05 DIAGNOSIS — Z331 Pregnant state, incidental: Secondary | ICD-10-CM | POA: Diagnosis not present

## 2022-03-05 DIAGNOSIS — N9089 Other specified noninflammatory disorders of vulva and perineum: Secondary | ICD-10-CM | POA: Diagnosis not present

## 2022-03-19 DIAGNOSIS — N911 Secondary amenorrhea: Secondary | ICD-10-CM | POA: Diagnosis not present

## 2022-03-29 DIAGNOSIS — Z3685 Encounter for antenatal screening for Streptococcus B: Secondary | ICD-10-CM | POA: Diagnosis not present

## 2022-03-29 DIAGNOSIS — Z3481 Encounter for supervision of other normal pregnancy, first trimester: Secondary | ICD-10-CM | POA: Diagnosis not present

## 2022-03-29 DIAGNOSIS — Z3A09 9 weeks gestation of pregnancy: Secondary | ICD-10-CM | POA: Diagnosis not present

## 2022-03-29 DIAGNOSIS — Z31430 Encounter of female for testing for genetic disease carrier status for procreative management: Secondary | ICD-10-CM | POA: Diagnosis not present

## 2022-03-29 LAB — OB RESULTS CONSOLE ABO/RH: RH Type: POSITIVE

## 2022-03-29 LAB — OB RESULTS CONSOLE HEPATITIS B SURFACE ANTIGEN: Hepatitis B Surface Ag: NEGATIVE

## 2022-03-29 LAB — HEPATITIS C ANTIBODY: HCV Ab: NEGATIVE

## 2022-03-29 LAB — OB RESULTS CONSOLE RUBELLA ANTIBODY, IGM: Rubella: IMMUNE

## 2022-03-29 LAB — OB RESULTS CONSOLE ANTIBODY SCREEN: Antibody Screen: NEGATIVE

## 2022-03-29 LAB — OB RESULTS CONSOLE RPR: RPR: NONREACTIVE

## 2022-03-29 LAB — OB RESULTS CONSOLE HIV ANTIBODY (ROUTINE TESTING): HIV: NONREACTIVE

## 2022-04-01 ENCOUNTER — Telehealth: Payer: Self-pay

## 2022-04-01 NOTE — Telephone Encounter (Signed)
Congratulations! I would need a TSH, free T4, and free T3, if possible.  If not, at least a TSH.

## 2022-04-01 NOTE — Telephone Encounter (Signed)
Pt contacted office to advise she was 9 weeks 5 days pregnant (03/29/2022) and has an upcoming appt with her OB. They offered to get any labs needed done at her appt.

## 2022-04-01 NOTE — Telephone Encounter (Signed)
Pt contacted and advised which labs are needed. Mychart message sent to pt as well.

## 2022-04-05 DIAGNOSIS — Z113 Encounter for screening for infections with a predominantly sexual mode of transmission: Secondary | ICD-10-CM | POA: Diagnosis not present

## 2022-04-05 DIAGNOSIS — E063 Autoimmune thyroiditis: Secondary | ICD-10-CM | POA: Diagnosis not present

## 2022-04-05 DIAGNOSIS — Z3481 Encounter for supervision of other normal pregnancy, first trimester: Secondary | ICD-10-CM | POA: Diagnosis not present

## 2022-04-05 DIAGNOSIS — Z3A10 10 weeks gestation of pregnancy: Secondary | ICD-10-CM | POA: Diagnosis not present

## 2022-04-05 LAB — OB RESULTS CONSOLE GC/CHLAMYDIA
Chlamydia: NEGATIVE
Neisseria Gonorrhea: NEGATIVE

## 2022-04-09 ENCOUNTER — Encounter: Payer: Self-pay | Admitting: Internal Medicine

## 2022-04-15 DIAGNOSIS — Z3482 Encounter for supervision of other normal pregnancy, second trimester: Secondary | ICD-10-CM | POA: Diagnosis not present

## 2022-04-15 DIAGNOSIS — Z3483 Encounter for supervision of other normal pregnancy, third trimester: Secondary | ICD-10-CM | POA: Diagnosis not present

## 2022-04-23 DIAGNOSIS — Z3A13 13 weeks gestation of pregnancy: Secondary | ICD-10-CM | POA: Diagnosis not present

## 2022-04-23 DIAGNOSIS — Z3400 Encounter for supervision of normal first pregnancy, unspecified trimester: Secondary | ICD-10-CM | POA: Diagnosis not present

## 2022-04-29 NOTE — L&D Delivery Note (Signed)
Delivery Note At 6:35 PM a viable female was delivered via Vaginal, Spontaneous (Presentation: Left Occiput Anterior).  APGAR: 9, 9; weight pend Placenta status: Spontaneous, Intact.  Cord: 3 vessels with the following complications: None.  Nuchal cord noted, tight and unable to reduce.   Anesthesia: Epidural Episiotomy: None Lacerations: Vaginal;Labial;Sulcus;2nd degree Suture Repair: Right sulcal and right labia, 2nd degree perineal.   2-0 vicryl rapide. Est. Blood Loss (mL): 427 cc  Mom to postpartum.  Baby to Couplet care / Skin to Skin.  Lyn Henri 10/26/2022, 7:18 PM

## 2022-05-06 ENCOUNTER — Inpatient Hospital Stay (HOSPITAL_COMMUNITY)
Admission: AD | Admit: 2022-05-06 | Discharge: 2022-05-06 | Disposition: A | Discharge status: Home / Self Care | Payer: 59 | Attending: Obstetrics and Gynecology | Admitting: Obstetrics and Gynecology

## 2022-05-06 ENCOUNTER — Encounter (HOSPITAL_COMMUNITY): Payer: Self-pay

## 2022-05-06 ENCOUNTER — Other Ambulatory Visit: Payer: Self-pay

## 2022-05-06 DIAGNOSIS — O26892 Other specified pregnancy related conditions, second trimester: Secondary | ICD-10-CM | POA: Diagnosis not present

## 2022-05-06 DIAGNOSIS — O219 Vomiting of pregnancy, unspecified: Secondary | ICD-10-CM | POA: Diagnosis not present

## 2022-05-06 DIAGNOSIS — M549 Dorsalgia, unspecified: Secondary | ICD-10-CM | POA: Insufficient documentation

## 2022-05-06 DIAGNOSIS — Z3A15 15 weeks gestation of pregnancy: Secondary | ICD-10-CM

## 2022-05-06 LAB — URINALYSIS, ROUTINE W REFLEX MICROSCOPIC
Bilirubin Urine: NEGATIVE
Glucose, UA: NEGATIVE mg/dL
Hgb urine dipstick: NEGATIVE
Ketones, ur: 80 mg/dL — AB
Leukocytes,Ua: NEGATIVE
Nitrite: NEGATIVE
Protein, ur: 30 mg/dL — AB
Specific Gravity, Urine: 1.029 (ref 1.005–1.030)
pH: 5 (ref 5.0–8.0)

## 2022-05-06 LAB — BASIC METABOLIC PANEL
Anion gap: 8 (ref 5–15)
BUN: 11 mg/dL (ref 6–20)
CO2: 23 mmol/L (ref 22–32)
Calcium: 9.1 mg/dL (ref 8.9–10.3)
Chloride: 106 mmol/L (ref 98–111)
Creatinine, Ser: 0.61 mg/dL (ref 0.44–1.00)
GFR, Estimated: >60 mL/min (ref >60)
Glucose, Bld: 114 mg/dL — ABNORMAL HIGH (ref 70–99)
Potassium: 3.5 mmol/L (ref 3.5–5.1)
Sodium: 137 mmol/L (ref 135–145)

## 2022-05-06 LAB — CBC WITH DIFFERENTIAL/PLATELET
Abs Immature Granulocytes: 0.07 10*3/uL (ref 0.00–0.07)
Basophils Absolute: 0 10*3/uL (ref 0.0–0.1)
Basophils Relative: 0 %
Eosinophils Absolute: 0 10*3/uL (ref 0.0–0.5)
Eosinophils Relative: 0 %
HCT: 44.2 % (ref 36.0–46.0)
Hemoglobin: 14.8 g/dL (ref 12.0–15.0)
Immature Granulocytes: 1 %
Lymphocytes Relative: 3 %
Lymphs Abs: 0.3 10*3/uL — ABNORMAL LOW (ref 0.7–4.0)
MCH: 28.1 pg (ref 26.0–34.0)
MCHC: 33.5 g/dL (ref 30.0–36.0)
MCV: 84 fL (ref 80.0–100.0)
Monocytes Absolute: 0.3 10*3/uL (ref 0.1–1.0)
Monocytes Relative: 2 %
Neutro Abs: 11.5 10*3/uL — ABNORMAL HIGH (ref 1.7–7.7)
Neutrophils Relative %: 94 %
Platelets: 298 10*3/uL (ref 150–400)
RBC: 5.26 MIL/uL — ABNORMAL HIGH (ref 3.87–5.11)
RDW: 13.2 % (ref 11.5–15.5)
WBC: 12.1 10*3/uL — ABNORMAL HIGH (ref 4.0–10.5)
nRBC: 0 % (ref 0.0–0.2)

## 2022-05-06 MED ORDER — LACTATED RINGERS IV BOLUS
1000.0000 mL | Freq: Once | INTRAVENOUS | Status: AC
  Administered 2022-05-06: 1000 mL via INTRAVENOUS

## 2022-05-06 MED ORDER — FAMOTIDINE IN NACL 20-0.9 MG/50ML-% IV SOLN
INTRAVENOUS | Status: AC
  Filled 2022-05-06: qty 50

## 2022-05-06 MED ORDER — FAMOTIDINE IN NACL 20-0.9 MG/50ML-% IV SOLN
20.0000 mg | Freq: Once | INTRAVENOUS | Status: AC
  Administered 2022-05-06: 20 mg via INTRAVENOUS
  Filled 2022-05-06: qty 50

## 2022-05-06 MED ORDER — SODIUM CHLORIDE 0.9 % IV SOLN
8.0000 mg | Freq: Once | INTRAVENOUS | Status: AC
  Administered 2022-05-06: 8 mg via INTRAVENOUS
  Filled 2022-05-06: qty 4

## 2022-05-06 NOTE — MAU Provider Note (Signed)
History     CSN: [id number]  Arrival date and time: 05/06/22 1516   Event Date/Time   First Provider Initiated Contact with Patient 05/06/22 1636      Chief Complaint  Patient presents with   Back Pain   Nausea   HPI  Annette Parks is a 32 y.o. G3P0020 at 52w1dwho receives care at [facility]-Dr. GHelane Rima She reports her next appt is on Jan 16th, 2024.  She presents today for Back Pain and Nausea. Patient reports middle back pain that has been present throughout the pregnancy.  She states that the pain is now worsening before she vomits. She describes the pain as a dull, but that it intensifies prior to vomiting. Patient reports her vomiting symptoms started at 0630 and has been occurring every hour. She states she had nausea and indigestion yesterday, for half the day, but it spontaneously resolved. She reports she was given a prescription for for phenergan, but has not had improvements. She reports eating saltine crackers, ginger ale, water, and ice chips today.  Yesterday patient ate pasta-cooked at home and cottage cheese for dinner. She denies vaginal concerns and has no other pains.   OB History     Gravida  3   Para      Term      Preterm      AB  2   Living         SAB  2   IAB      Ectopic      Multiple      Live Births              Past Medical History:  Diagnosis Date   Anal fissure    Hashimoto's disease    IBS (irritable bowel syndrome)    Migraine headache    Pelvic floor dysfunction?    Perforated eardrum    PONV (postoperative nausea and vomiting)    UTI (urinary tract infection)    Vaginal Pap smear, abnormal     Past Surgical History:  Procedure Laterality Date   DILATION AND EVACUATION  09/06/2021   EAR TUBE REMOVAL     TONSILLECTOMY AND ADENOIDECTOMY     TYMPANOPLASTY Right    TYMPANOSTOMY TUBE PLACEMENT     x 3   WISDOM TOOTH EXTRACTION      Family History  Problem Relation Age of Onset   Migraines Mother     Hashimoto's thyroiditis Mother    Endometriosis Mother    Hyperlipidemia Father    Diverticulitis Father        had to have part of colon removed   Asthma Brother    Diabetes Maternal Grandmother     Social History   Tobacco Use   Smoking status: Never   Smokeless tobacco: Never  Vaping Use   Vaping Use: Never used  Substance Use Topics   Alcohol use: Not Currently    Comment: occasional-2 per month   Drug use: No    Allergies: No Known Allergies  Medications Prior to Admission  Medication Sig Dispense Refill Last Dose   loratadine (CLARITIN) 10 MG tablet Take 10 mg by mouth daily.   05/05/2022   Prenatal Vit-Fe Fumarate-FA (PRENATAL VITAMIN PO)    05/05/2022   promethazine (PHENERGAN) 12.5 MG tablet Take 12.5 mg by mouth every 6 (six) hours as needed for nausea or vomiting.   05/06/2022   Azelaic Acid (FINACEA) 15 % gel Apply topically 2 times a day to  the areas of acne 50 g 3     Review of Systems  Constitutional:  Positive for chills. Negative for fever.  HENT:  Negative for congestion.   Gastrointestinal:  Positive for nausea and vomiting. Negative for constipation and diarrhea.  Genitourinary:  Negative for difficulty urinating, dysuria, vaginal bleeding and vaginal discharge.  Musculoskeletal:  Positive for back pain (Mid back).  Neurological:  Positive for headaches (Resolved spontaneously). Negative for dizziness and light-headedness.   Physical Exam   Blood pressure 108/74, pulse (!) 125, temperature 99.2 F (37.3 C), temperature source Oral, resp. rate 16, height '5\' 5"'$  (1.651 m), weight 65.5 kg, last menstrual period 11/14/2020, SpO2 100 %, unknown if currently breastfeeding.  Physical Exam Vitals reviewed.  Constitutional:      Appearance: Normal appearance.  HENT:     Head: Normocephalic and atraumatic.  Eyes:     Conjunctiva/sclera: Conjunctivae normal.  Cardiovascular:     Rate and Rhythm: Normal rate.  Pulmonary:     Effort: Pulmonary effort is normal.  No respiratory distress.  Abdominal:     General: Bowel sounds are normal.  Musculoskeletal:        General: Normal range of motion.     Cervical back: Normal range of motion.     Thoracic back: Tenderness present.       Back:     Comments: X=Areas of tenderness  Skin:    General: Skin is warm and dry.  Neurological:     Mental Status: She is alert and oriented to person, place, and time.  Psychiatric:        Mood and Affect: Mood normal.        Behavior: Behavior normal.     MAU Course  Procedures Results for orders placed or performed during the hospital encounter of 05/06/22 (from the past 24 hour(s))  Basic metabolic panel     Status: Abnormal   Collection Time: 05/06/22  5:18 PM  Result Value Ref Range   Sodium 137 135 - 145 mmol/L   Potassium 3.5 3.5 - 5.1 mmol/L   Chloride 106 98 - 111 mmol/L   CO2 23 22 - 32 mmol/L   Glucose, Bld 114 (H) 70 - 99 mg/dL   BUN 11 6 - 20 mg/dL   Creatinine, Ser 0.61 0.44 - 1.00 mg/dL   Calcium 9.1 8.9 - 10.3 mg/dL   GFR, Estimated >60 >60 mL/min   Anion gap 8 5 - 15  CBC with Differential/Platelet     Status: Abnormal   Collection Time: 05/06/22  5:18 PM  Result Value Ref Range   WBC 12.1 (H) 4.0 - 10.5 K/uL   RBC 5.26 (H) 3.87 - 5.11 MIL/uL   Hemoglobin 14.8 12.0 - 15.0 g/dL   HCT 44.2 36.0 - 46.0 %   MCV 84.0 80.0 - 100.0 fL   MCH 28.1 26.0 - 34.0 pg   MCHC 33.5 30.0 - 36.0 g/dL   RDW 13.2 11.5 - 15.5 %   Platelets 298 150 - 400 K/uL   nRBC 0.0 0.0 - 0.2 %   Neutrophils Relative % 94 %   Neutro Abs 11.5 (H) 1.7 - 7.7 K/uL   Lymphocytes Relative 3 %   Lymphs Abs 0.3 (L) 0.7 - 4.0 K/uL   Monocytes Relative 2 %   Monocytes Absolute 0.3 0.1 - 1.0 K/uL   Eosinophils Relative 0 %   Eosinophils Absolute 0.0 0.0 - 0.5 K/uL   Basophils Relative 0 %   Basophils Absolute 0.0  0.0 - 0.1 K/uL   Immature Granulocytes 1 %   Abs Immature Granulocytes 0.07 0.00 - 0.07 K/uL  Urinalysis, Routine w reflex microscopic Urine, Clean Catch      Status: Abnormal   Collection Time: 05/06/22  5:21 PM  Result Value Ref Range   Color, Urine YELLOW YELLOW   APPearance HAZY (A) CLEAR   Specific Gravity, Urine 1.029 1.005 - 1.030   pH 5.0 5.0 - 8.0   Glucose, UA NEGATIVE NEGATIVE mg/dL   Hgb urine dipstick NEGATIVE NEGATIVE   Bilirubin Urine NEGATIVE NEGATIVE   Ketones, ur 80 (A) NEGATIVE mg/dL   Protein, ur 30 (A) NEGATIVE mg/dL   Nitrite NEGATIVE NEGATIVE   Leukocytes,Ua NEGATIVE NEGATIVE   RBC / HPF 0-5 0 - 5 RBC/hpf   WBC, UA 0-5 0 - 5 WBC/hpf   Bacteria, UA RARE (A) NONE SEEN   Squamous Epithelial / HPF 0-5 0 - 5 /HPF   Mucus PRESENT     MDM Start IV LR Bolus Antiemetic PPI Labs: CBC and CMP Prescription Assessment and Plan  32 year old, G3P0020  SIUP at 15.1 weeks Nausea and Vomiting  -Collect labs as ordered. -Start IV with LR Bolus -Give Zofran IV. -Give Pepcid. -Patient declines pain medication and provider will give heating pads to apply to area.  -POC discussed with patient who is agreeable. -No questions or concerns. -Will observe and reassess for readiness of PO challenge.   Maryann Conners 05/06/2022, 4:36 PM   Reassessment (6:46 PM)  -Nurse reports patient tolerating crackers and oral fluids. -Provider to bedside and patient reports improvement in symptoms.  -Discussed sending prescription for Zofran to sent to pharmacy on file.  -Informed that labs with some left shift, but unable to contribute  -Patient agreeable. -Encouraged rest, hydration, and advance from bland to regular diet as tolerated. -No questions -Follow up as scheduled. -Encouraged to call primary office or return to MAU if symptoms worsen or with the onset of new symptoms. -Discharged to home in stable condition.  Maryann Conners MSN, CNM Advanced Practice Provider, [facility]

## 2022-05-06 NOTE — MAU Note (Signed)
Annette Parks is a 32 y.o. at 10w1dhere in MAU reporting: started vomiting this morning around 0630, states she is vomiting once an hour. Not able to keep anything down. Has chills but no fever. Was Rx phenergan by the office and she took 25 mg at 1320 with no relief. Having some back pain. No bleeding or LOF.   Onset of complaint: today  Pain score: 4/10  Vitals:   05/06/22 1550  BP: 108/74  Pulse: (!) 125  Resp: 16  Temp: 99.2 F (37.3 C)  SpO2: 100%     FHT:169  Lab orders placed from triage: UA

## 2022-06-04 DIAGNOSIS — Z363 Encounter for antenatal screening for malformations: Secondary | ICD-10-CM | POA: Diagnosis not present

## 2022-06-04 DIAGNOSIS — R5383 Other fatigue: Secondary | ICD-10-CM | POA: Diagnosis not present

## 2022-06-04 DIAGNOSIS — Z3402 Encounter for supervision of normal first pregnancy, second trimester: Secondary | ICD-10-CM | POA: Diagnosis not present

## 2022-06-04 DIAGNOSIS — Z3A19 19 weeks gestation of pregnancy: Secondary | ICD-10-CM | POA: Diagnosis not present

## 2022-06-25 DIAGNOSIS — Z362 Encounter for other antenatal screening follow-up: Secondary | ICD-10-CM | POA: Diagnosis not present

## 2022-06-25 DIAGNOSIS — Z3A22 22 weeks gestation of pregnancy: Secondary | ICD-10-CM | POA: Diagnosis not present

## 2022-06-25 DIAGNOSIS — Z3400 Encounter for supervision of normal first pregnancy, unspecified trimester: Secondary | ICD-10-CM | POA: Diagnosis not present

## 2022-07-25 DIAGNOSIS — Z3480 Encounter for supervision of other normal pregnancy, unspecified trimester: Secondary | ICD-10-CM | POA: Diagnosis not present

## 2022-07-25 DIAGNOSIS — Z3A26 26 weeks gestation of pregnancy: Secondary | ICD-10-CM | POA: Diagnosis not present

## 2022-07-25 DIAGNOSIS — Z23 Encounter for immunization: Secondary | ICD-10-CM | POA: Diagnosis not present

## 2022-07-25 DIAGNOSIS — Z3402 Encounter for supervision of normal first pregnancy, second trimester: Secondary | ICD-10-CM | POA: Diagnosis not present

## 2022-08-18 ENCOUNTER — Inpatient Hospital Stay (HOSPITAL_COMMUNITY)
Admission: AD | Admit: 2022-08-18 | Discharge: 2022-08-18 | Disposition: A | Discharge status: Home / Self Care | Payer: 59 | Attending: Obstetrics & Gynecology | Admitting: Obstetrics & Gynecology

## 2022-08-18 ENCOUNTER — Encounter (HOSPITAL_COMMUNITY): Payer: Self-pay | Admitting: Obstetrics & Gynecology

## 2022-08-18 DIAGNOSIS — Z3A30 30 weeks gestation of pregnancy: Secondary | ICD-10-CM | POA: Diagnosis not present

## 2022-08-18 DIAGNOSIS — R109 Unspecified abdominal pain: Secondary | ICD-10-CM

## 2022-08-18 DIAGNOSIS — E876 Hypokalemia: Secondary | ICD-10-CM | POA: Diagnosis not present

## 2022-08-18 DIAGNOSIS — R7989 Other specified abnormal findings of blood chemistry: Secondary | ICD-10-CM | POA: Diagnosis not present

## 2022-08-18 DIAGNOSIS — O26893 Other specified pregnancy related conditions, third trimester: Secondary | ICD-10-CM

## 2022-08-18 DIAGNOSIS — O26899 Other specified pregnancy related conditions, unspecified trimester: Secondary | ICD-10-CM

## 2022-08-18 LAB — URINALYSIS, ROUTINE W REFLEX MICROSCOPIC
Bilirubin Urine: NEGATIVE
Glucose, UA: NEGATIVE mg/dL
Hgb urine dipstick: NEGATIVE
Ketones, ur: NEGATIVE mg/dL
Leukocytes,Ua: NEGATIVE
Nitrite: NEGATIVE
Protein, ur: NEGATIVE mg/dL
Specific Gravity, Urine: 1.015 (ref 1.005–1.030)
pH: 6 (ref 5.0–8.0)

## 2022-08-18 LAB — CBC WITH DIFFERENTIAL/PLATELET
Abs Immature Granulocytes: 0.4 10*3/uL — ABNORMAL HIGH (ref 0.00–0.07)
Basophils Absolute: 0.1 10*3/uL (ref 0.0–0.1)
Basophils Relative: 0 %
Eosinophils Absolute: 0.1 10*3/uL (ref 0.0–0.5)
Eosinophils Relative: 1 %
HCT: 37.3 % (ref 36.0–46.0)
Hemoglobin: 12.4 g/dL (ref 12.0–15.0)
Immature Granulocytes: 3 %
Lymphocytes Relative: 15 %
Lymphs Abs: 2.1 10*3/uL (ref 0.7–4.0)
MCH: 27.7 pg (ref 26.0–34.0)
MCHC: 33.2 g/dL (ref 30.0–36.0)
MCV: 83.3 fL (ref 80.0–100.0)
Monocytes Absolute: 1.1 10*3/uL — ABNORMAL HIGH (ref 0.1–1.0)
Monocytes Relative: 8 %
Neutro Abs: 10.1 10*3/uL — ABNORMAL HIGH (ref 1.7–7.7)
Neutrophils Relative %: 73 %
Platelets: 198 10*3/uL (ref 150–400)
RBC: 4.48 MIL/uL (ref 3.87–5.11)
RDW: 14.2 % (ref 11.5–15.5)
WBC: 13.9 10*3/uL — ABNORMAL HIGH (ref 4.0–10.5)
nRBC: 0 % (ref 0.0–0.2)

## 2022-08-18 LAB — COMPREHENSIVE METABOLIC PANEL
ALT: 19 U/L (ref 0–44)
AST: 17 U/L (ref 15–41)
Albumin: 2.9 g/dL — ABNORMAL LOW (ref 3.5–5.0)
Alkaline Phosphatase: 67 U/L (ref 38–126)
Anion gap: 11 (ref 5–15)
BUN: 7 mg/dL (ref 6–20)
CO2: 21 mmol/L — ABNORMAL LOW (ref 22–32)
Calcium: 9 mg/dL (ref 8.9–10.3)
Chloride: 106 mmol/L (ref 98–111)
Creatinine, Ser: 0.49 mg/dL (ref 0.44–1.00)
GFR, Estimated: >60 mL/min (ref >60)
Glucose, Bld: 112 mg/dL — ABNORMAL HIGH (ref 70–99)
Potassium: 3.3 mmol/L — ABNORMAL LOW (ref 3.5–5.1)
Sodium: 138 mmol/L (ref 135–145)
Total Bilirubin: 0.4 mg/dL (ref 0.3–1.2)
Total Protein: 5.9 g/dL — ABNORMAL LOW (ref 6.5–8.1)

## 2022-08-18 LAB — LIPASE, BLOOD: Lipase: 37 U/L (ref 11–51)

## 2022-08-18 LAB — AMYLASE: Amylase: 73 U/L (ref 28–100)

## 2022-08-18 NOTE — MAU Provider Note (Signed)
History     CSN: [id number]  Arrival date and time: 08/18/22 1926   Event Date/Time   First Provider Initiated Contact with Patient 08/18/22 2033      Chief Complaint  Patient presents with   Abdominal Pain   Annette Parks , a  32 y.o. G3P0020 at [redacted]w[redacted]d presents to MAU with complaints of upper abdominal burning sensation that started today. She reports being in church and feeling a burning sensation across her upper abdomen. She describes as "a dull burning". She states the pain is relieved by laying down, but when she is up and moving again she states it comes back. She denies attempting to relieve symptoms. She currently rates pain a 2/10. Denies history of IBS, Chron's, constipation nausea and vomiting and diarrhea. She also noted a localized pain to the right of her belly button she states she believes is the baby, "balling up."  She states that since this has occurred, she just has generalized overall abdominal "soreness." She states its not painful just uncomfortable. She denies abnormal vaginal discharge, vaginal bleeding, leaking of fluid and contractions. She endorses positive fetal movement.          OB History     Gravida  3   Para      Term      Preterm      AB  2   Living         SAB  2   IAB      Ectopic      Multiple      Live Births              Past Medical History:  Diagnosis Date   Anal fissure    Hashimoto's disease    IBS (irritable bowel syndrome)    Migraine headache    Pelvic floor dysfunction?    Perforated eardrum    PONV (postoperative nausea and vomiting)    UTI (urinary tract infection)    Vaginal Pap smear, abnormal     Past Surgical History:  Procedure Laterality Date   DILATION AND EVACUATION  09/06/2021   EAR TUBE REMOVAL     TONSILLECTOMY AND ADENOIDECTOMY     TYMPANOPLASTY Right    TYMPANOSTOMY TUBE PLACEMENT     x 3   WISDOM TOOTH EXTRACTION      Family History  Problem Relation Age of Onset   Migraines  Mother    Hashimoto's thyroiditis Mother    Endometriosis Mother    Hyperlipidemia Father    Diverticulitis Father        had to have part of colon removed   Asthma Brother    Diabetes Maternal Grandmother     Social History   Tobacco Use   Smoking status: Never   Smokeless tobacco: Never  Vaping Use   Vaping Use: Never used  Substance Use Topics   Alcohol use: Not Currently    Comment: occasional-2 per month   Drug use: No    Allergies: No Known Allergies  Medications Prior to Admission  Medication Sig Dispense Refill Last Dose   Prenatal Vit-Fe Fumarate-FA (PRENATAL VITAMIN PO)    08/17/2022   loratadine (CLARITIN) 10 MG tablet Take 10 mg by mouth daily.      promethazine (PHENERGAN) 12.5 MG tablet Take 12.5 mg by mouth every 6 (six) hours as needed for nausea or vomiting.       Review of Systems  Constitutional:  Negative for chills, fatigue and fever.  Eyes:  Negative for pain and visual disturbance.  Respiratory:  Negative for apnea, shortness of breath and wheezing.   Cardiovascular:  Negative for chest pain and palpitations.  Gastrointestinal:  Positive for abdominal pain. Negative for abdominal distention, constipation, diarrhea, nausea and vomiting.  Genitourinary:  Negative for difficulty urinating, dysuria, pelvic pain, vaginal bleeding, vaginal discharge and vaginal pain.  Musculoskeletal:  Negative for back pain.  Neurological:  Negative for seizures, weakness and headaches.  Psychiatric/Behavioral:  Negative for suicidal ideas.    Physical Exam   Blood pressure 125/82, pulse (!) 110, temperature 98.4 F (36.9 C), resp. rate 15, height  (1.651 m), weight 77.2 kg, last menstrual period 11/14/2020, SpO2 100 %, unknown if currently breastfeeding.  Physical Exam Vitals and nursing note reviewed.  Constitutional:      General: She is not in acute distress.    Appearance: Normal appearance.  HENT:     Head: Normocephalic.  Pulmonary:     Effort:  Pulmonary effort is normal.  Abdominal:     Palpations: Abdomen is soft.     Tenderness: There is no abdominal tenderness. There is no right CVA tenderness, left CVA tenderness, guarding or rebound.     Comments: Pregnant   Musculoskeletal:     Cervical back: Normal range of motion.  Skin:    General: Skin is warm and dry.     Capillary Refill: Capillary refill takes 2 to 3 seconds.  Neurological:     Mental Status: She is alert and oriented to person, place, and time.  Psychiatric:        Mood and Affect: Mood normal.    FHT: 155bpm with moderate variability. Accels present, No decels  Toco: UI noted. No contractions   MAU Course  Procedures Orders Placed This Encounter  Procedures   CBC with Differential/Platelet   Comprehensive metabolic panel   Lipase, blood   Amylase   Urinalysis, Routine w reflex microscopic -Urine, Clean Catch   Results for orders placed or performed during the hospital encounter of 08/18/22 (from the past 24 hour(s))  Urinalysis, Routine w reflex microscopic -Urine, Clean Catch     Status: None   Collection Time: 08/18/22  8:15 PM  Result Value Ref Range   Color, Urine YELLOW YELLOW   APPearance CLEAR CLEAR   Specific Gravity, Urine 1.015 1.005 - 1.030   pH 6.0 5.0 - 8.0   Glucose, UA NEGATIVE NEGATIVE mg/dL   Hgb urine dipstick NEGATIVE NEGATIVE   Bilirubin Urine NEGATIVE NEGATIVE   Ketones, ur NEGATIVE NEGATIVE mg/dL   Protein, ur NEGATIVE NEGATIVE mg/dL   Nitrite NEGATIVE NEGATIVE   Leukocytes,Ua NEGATIVE NEGATIVE  CBC with Differential/Platelet     Status: Abnormal   Collection Time: 08/18/22  8:34 PM  Result Value Ref Range   WBC 13.9 (H) 4.0 - 10.5 K/uL   RBC 4.48 3.87 - 5.11 MIL/uL   Hemoglobin 12.4 12.0 - 15.0 g/dL   HCT 81.1 91.4 - 78.2 %   MCV 83.3 80.0 - 100.0 fL   MCH 27.7 26.0 - 34.0 pg   MCHC 33.2 30.0 - 36.0 g/dL   RDW 95.6 21.3 - 08.6 %   Platelets 198 150 - 400 K/uL   nRBC 0.0 0.0 - 0.2 %   Neutrophils Relative % 73 %    Neutro Abs 10.1 (H) 1.7 - 7.7 K/uL   Lymphocytes Relative 15 %   Lymphs Abs 2.1 0.7 - 4.0 K/uL   Monocytes Relative 8 %  Monocytes Absolute 1.1 (H) 0.1 - 1.0 K/uL   Eosinophils Relative 1 %   Eosinophils Absolute 0.1 0.0 - 0.5 K/uL   Basophils Relative 0 %   Basophils Absolute 0.1 0.0 - 0.1 K/uL   Immature Granulocytes 3 %   Abs Immature Granulocytes 0.40 (H) 0.00 - 0.07 K/uL  Comprehensive metabolic panel     Status: Abnormal   Collection Time: 08/18/22  8:34 PM  Result Value Ref Range   Sodium 138 135 - 145 mmol/L   Potassium 3.3 (L) 3.5 - 5.1 mmol/L   Chloride 106 98 - 111 mmol/L   CO2 21 (L) 22 - 32 mmol/L   Glucose, Bld 112 (H) 70 - 99 mg/dL   BUN 7 6 - 20 mg/dL   Creatinine, Ser 1.61 0.44 - 1.00 mg/dL   Calcium 9.0 8.9 - 09.6 mg/dL   Total Protein 5.9 (L) 6.5 - 8.1 g/dL   Albumin 2.9 (L) 3.5 - 5.0 g/dL   AST 17 15 - 41 U/L   ALT 19 0 - 44 U/L   Alkaline Phosphatase 67 38 - 126 U/L   Total Bilirubin 0.4 0.3 - 1.2 mg/dL   GFR, Estimated >04 >54 mL/min   Anion gap 11 5 - 15  Lipase, blood     Status: None   Collection Time: 08/18/22  8:34 PM  Result Value Ref Range   Lipase 37 11 - 51 U/L  Amylase     Status: None   Collection Time: 08/18/22  8:34 PM  Result Value Ref Range   Amylase 73 28 - 100 U/L     MDM - Offered A GI cocktail for upper abdominal discomfort. Patient declined - UA normal. Low suspicion for UTI and dehydration.  - White count mildly elevated, normal for pregnancy, otherwise normal CBC - Liver Enzymes normal. Amylase and Lipase normal. - Potassium mildly low. Recommendation for PO Supplementation.  - Reviewed results with patient.  - Plan for discharge.    Assessment and Plan   1. Abdominal pain affecting pregnancy   2. Localized abdominal pain   3. Low serum potassium level   4. [redacted] weeks gestation of pregnancy    - Reviewed that localized pain can be related to fetal position and the stretching of the abdominal wall to accommodate  for the uterus.  - Recommendation for PO supplementation of Potassium in addition to Prenatal vitamin.  - Patient verbalized understanding.  - Worsening signs and return precautions reviewed. - FHT appropriate for gestational age upon time of discharge.  - Patient discharged home in stable condition and may return to MAU as needed.   Claudette Head, MSN CNM  08/18/2022, 8:33 PM

## 2022-08-18 NOTE — MAU Note (Signed)
..  Annette Parks is a 32 y.o. at [redacted]w[redacted]d here in MAU reporting: intermittent burning pain in abdomen that began around 11:30 am. Has not taken anything for pain    a localized sore/ripping pain around her belly button that has been going on for a week (2/10) Denies vaginal bleeding, contractions, LOF. +FM  Pain score: see note Vitals:   08/18/22 2013  BP: 125/82  Pulse: (!) 110  Resp: 15  Temp: 98.4 F (36.9 C)  SpO2: 100%     FHT: 151 Lab orders placed from triage: UA

## 2022-08-28 DIAGNOSIS — E063 Autoimmune thyroiditis: Secondary | ICD-10-CM | POA: Diagnosis not present

## 2022-09-16 DIAGNOSIS — Z3480 Encounter for supervision of other normal pregnancy, unspecified trimester: Secondary | ICD-10-CM | POA: Diagnosis not present

## 2022-09-24 ENCOUNTER — Encounter: Payer: Self-pay | Admitting: Internal Medicine

## 2022-09-24 ENCOUNTER — Ambulatory Visit: Payer: 59 | Admitting: Internal Medicine

## 2022-09-24 VITALS — BP 120/70 | HR 118 | Ht 65.0 in | Wt 178.0 lb

## 2022-09-24 DIAGNOSIS — E063 Autoimmune thyroiditis: Secondary | ICD-10-CM | POA: Diagnosis not present

## 2022-09-24 NOTE — Progress Notes (Signed)
Patient ID: [name], female   DOB: 1990/08/19, 32 y.o.   MRN: [id number]   HPI  Annette Parks is a 32 y.o.-year-old very pleasant female, initially referred by her PCP, Dr. Dallas Schimke, returning for follow-up for Hashimoto's thyroiditis.  Last visit 1 year ago. Her mother, Annette Parks, is also my patient.  Interim hx: She had an ectopic pregnancy 11/2020 for which she had to get methotrexate.  Before last visit, she had a miscarriage at 34 weeks,for which she had to have a D and C.  However, she contacted me in 03/2022 that she was pregnant again.  This pregnancy has been going well.  Baby (boy) is healthy.  EDD 10/27/2022. She was seen in the ED for gastroenteritis in 04/2022 and for abdominal itching 07/2022. She has heat intolerance.  Reviewed history: She was diagnosed with Hashimoto's thyroiditis in 07/2018.  She has normal thyroid tests so she did not require levothyroxine.  Reviewed her TFTs: 08/28/2022: TSH 2.17 06/04/2022: TSH 2.88 04/05/2022: TSH 1.76 Lab Results  Component Value Date   TSH 2.67 01/09/2022   TSH 1.96 09/18/2021   TSH 1.60 09/18/2020   TSH 1.67 12/27/2019   TSH 1.35 09/13/2019   TSH 1.58 03/08/2019   TSH 1.66 08/20/2018   TSH 1.73 08/05/2016   TSH 1.30 02/16/2016   TSH 1.529 03/22/2015   FREET4 0.96 09/18/2021   FREET4 0.90 09/18/2020   FREET4 0.80 09/13/2019   FREET4 1.02 03/08/2019   Her thyroid antibodies were elevated: Component     Latest Ref Rng 09/18/2020 09/18/2021  Thyroglobulin Ab     < or = 1 IU/mL 1  1   Thyroperoxidase Ab SerPl-aCnc     <9 IU/mL 94 (H)  346 (H)    Component     Latest Ref Rng & Units 09/13/2019  Thyroperoxidase Ab SerPl-aCnc     <9 IU/mL 53 (H)   Component     Latest Ref Rng & Units 03/22/2015 08/20/2018 03/08/2019  Thyroperoxidase Ab SerPl-aCnc     <9 IU/mL 2 23 (H) 46 (H)  Thyroglobulin Ab     < or = 1 IU/mL  1    We started selenium 200 mcg daily in 08/2018, but her TPO antibodies were still elevated at last  check so we stopped it.  She previously had mild weight gain, fatigue, cold intolerance, anxiety, constipation, hair thinning and loss.  These improved.  Her hair loss improved after starting hair skin and nails vitamins.  Then switched to prenatal vitamins.  Pt denies: - feeling nodules in neck - hoarseness - dysphagia - choking  She has + FH of thyroid disorders in: mother, MGM. No FH of thyroid cancer. No h/o radiation tx to head or neck. No herbal supplements. No Biotin use now. No recent steroids use.   Pt. also has a history of Raynauds phenomenon. She is on OCPs - since a teenager. She tried to stop x 8 mo restarted b/c acne and also dysmenorrhea. Now on 3 consecutive months. She also has a history of heart valve insufficiency. She has neck, lower back pain, hip pain; she was diagnosed with spina bifida occulta. She has constipation (chronic) and anal fissures. She has hyperlipidemia She got married since last visit.  No plans for children the first 2 years of marriage. She was on Amitriptyline for bladder dysfxn.  Previously had palpitations found to be PVCs, by cardiology.  No recent episodes.  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  ROS: + See HPI  I reviewed pt's medications, allergies, PMH, social hx, family hx, and changes were documented in the history of present illness. Otherwise, unchanged from my initial visit note.  Past Medical History:  Diagnosis Date   Anal fissure    Hashimoto's disease    IBS (irritable bowel syndrome)    Migraine headache    Pelvic floor dysfunction?    Perforated eardrum    PONV (postoperative nausea and vomiting)    UTI (urinary tract infection)    Vaginal Pap smear, abnormal    Past Surgical History:  Procedure Laterality Date   DILATION AND EVACUATION  09/06/2021   EAR TUBE REMOVAL     TONSILLECTOMY AND ADENOIDECTOMY     TYMPANOPLASTY Right    TYMPANOSTOMY TUBE PLACEMENT     x 3   WISDOM TOOTH EXTRACTION     Social History    Socioeconomic History   Marital status: Married    Spouse name: Not on file   Number of children: 0   Years of education: Not on file   Highest education level: Not on file  Occupational History   Occupation: Registered nurse  Tobacco Use   Smoking status: Never   Smokeless tobacco: Never  Vaping Use   Vaping Use: Never used  Substance and Sexual Activity   Alcohol use: Not Currently    Comment: occasional-2 per month   Drug use: No   Sexual activity: Not Currently  Other Topics Concern   Not on file  Social History Narrative   Single, no children    moved here from [place] went to [facility]. Moved here after her parents moved here.   Clinic nurse in [employer]   Social Determinants of Health   Financial Resource Strain: Not on file  Food Insecurity: Not on file  Transportation Needs: Not on file  Physical Activity: Not on file  Stress: Not on file  Social Connections: Not on file  Intimate Partner Violence: Not on file   Current Outpatient Medications on File Prior to Visit  Medication Sig Dispense Refill   loratadine (CLARITIN) 10 MG tablet Take 10 mg by mouth daily.     Prenatal Vit-Fe Fumarate-FA (PRENATAL VITAMIN PO)      promethazine (PHENERGAN) 12.5 MG tablet Take 12.5 mg by mouth every 6 (six) hours as needed for nausea or vomiting.     No current facility-administered medications on file prior to visit.   No Known Allergies Family History  Problem Relation Age of Onset   Migraines Mother    Hashimoto's thyroiditis Mother    Endometriosis Mother    Hyperlipidemia Father    Diverticulitis Father        had to have part of colon removed   Asthma Brother    Diabetes Maternal Grandmother    PE: BP 120/70 (BP Location: Right Arm, Patient Position: Sitting, Cuff Size: Normal)   Pulse (!) 118   Ht 5\' 5"  (1.651 m)   Wt 178 lb (80.7 kg)   LMP 11/14/2020 Comment: D & E 09/06/21 - still bleeding from procedure   SpO2 98%   BMI 29.62 kg/m  Wt Readings from Last 3 Encounters:  09/24/22 178 lb (80.7 kg)  08/18/22 170 lb 1.6 oz (77.2 kg)  05/06/22 144 lb 6.4 oz (65.5 kg)   Constitutional: Pregnant appearing, in NAD Eyes:  EOMI, no exophthalmos ENT: no thyromegaly, no cervical lymphadenopathy Cardiovascular: Tachycardia, RR, No MRG Respiratory: CTA B Musculoskeletal: no deformities Skin: no rashes Neurological: no  tremor with outstretched hands  ASSESSMENT: 1. Hashimoto thyroiditis - in pregnancy  PLAN: 1. Patient with history of Hashimoto's thyroiditis, not on levothyroxine - in third trimester - she appears euthyroid.  - she had an ectopic pregnancy last year and a miscarriage, but currently pregnant, has 4 more weeks until term - she does not appear to have a goiter, thyroid nodules, or neck compression symptoms.  She previously had neck fullness after her COVID vaccines, likely due to an inflamed lymph node, but now resolved - latest thyroid labs reviewed with pt. >> normal at last check at the beginning of this month. -She is not on levothyroxine, but we discussed about taking the thyroid hormone every day, with water, >30 minutes before breakfast, separated by >4 hours from acid reflux medications, calcium, iron, multivitamins. Pt. is taking it correctly. - will check thyroid tests approximately 1.5 months after she gives birth: TSH, free T4, free T3 - OTW, I will see her back in a year  Orders Placed This Encounter  Procedures   TSH   T4, free   T3, free   Carlus Pavlov, MD PhD [facility]

## 2022-09-24 NOTE — Patient Instructions (Addendum)
Please stop at the lab.  Try to get a new TSH mid August.  Please come back for a follow-up appointment in 1 year.

## 2022-09-26 ENCOUNTER — Ambulatory Visit: Payer: 59 | Admitting: Internal Medicine

## 2022-09-30 DIAGNOSIS — O321XX0 Maternal care for breech presentation, not applicable or unspecified: Secondary | ICD-10-CM | POA: Diagnosis not present

## 2022-09-30 DIAGNOSIS — Z3685 Encounter for antenatal screening for Streptococcus B: Secondary | ICD-10-CM | POA: Diagnosis not present

## 2022-09-30 DIAGNOSIS — Z3A36 36 weeks gestation of pregnancy: Secondary | ICD-10-CM | POA: Diagnosis not present

## 2022-09-30 DIAGNOSIS — Z3400 Encounter for supervision of normal first pregnancy, unspecified trimester: Secondary | ICD-10-CM | POA: Diagnosis not present

## 2022-09-30 LAB — OB RESULTS CONSOLE GBS: GBS: POSITIVE

## 2022-10-19 ENCOUNTER — Other Ambulatory Visit: Payer: Self-pay | Admitting: Advanced Practice Midwife

## 2022-10-21 ENCOUNTER — Telehealth (HOSPITAL_COMMUNITY): Payer: Self-pay | Admitting: *Deleted

## 2022-10-21 ENCOUNTER — Encounter (HOSPITAL_COMMUNITY): Payer: Self-pay | Admitting: *Deleted

## 2022-10-21 NOTE — Telephone Encounter (Signed)
Preadmission screen  

## 2022-10-25 ENCOUNTER — Inpatient Hospital Stay (HOSPITAL_COMMUNITY)
Admission: AD | Admit: 2022-10-25 | Discharge: 2022-10-28 | DRG: 806 | Disposition: A | Discharge status: Home / Self Care | Payer: 59 | Attending: Obstetrics and Gynecology | Admitting: Obstetrics and Gynecology

## 2022-10-25 ENCOUNTER — Inpatient Hospital Stay (HOSPITAL_COMMUNITY): Payer: 59 | Admitting: Anesthesiology

## 2022-10-25 ENCOUNTER — Other Ambulatory Visit: Payer: Self-pay

## 2022-10-25 ENCOUNTER — Encounter (HOSPITAL_COMMUNITY): Payer: Self-pay | Admitting: Obstetrics and Gynecology

## 2022-10-25 DIAGNOSIS — D62 Acute posthemorrhagic anemia: Secondary | ICD-10-CM | POA: Diagnosis not present

## 2022-10-25 DIAGNOSIS — O9081 Anemia of the puerperium: Secondary | ICD-10-CM | POA: Diagnosis not present

## 2022-10-25 DIAGNOSIS — O3663X0 Maternal care for excessive fetal growth, third trimester, not applicable or unspecified: Principal | ICD-10-CM | POA: Diagnosis present

## 2022-10-25 DIAGNOSIS — O99824 Streptococcus B carrier state complicating childbirth: Secondary | ICD-10-CM | POA: Diagnosis not present

## 2022-10-25 DIAGNOSIS — O26893 Other specified pregnancy related conditions, third trimester: Secondary | ICD-10-CM | POA: Diagnosis present

## 2022-10-25 DIAGNOSIS — Z3490 Encounter for supervision of normal pregnancy, unspecified, unspecified trimester: Secondary | ICD-10-CM

## 2022-10-25 DIAGNOSIS — Z3A39 39 weeks gestation of pregnancy: Secondary | ICD-10-CM | POA: Diagnosis not present

## 2022-10-25 LAB — TYPE AND SCREEN
ABO/RH(D): B POS
Antibody Screen: NEGATIVE

## 2022-10-25 LAB — COMPREHENSIVE METABOLIC PANEL
ALT: 19 U/L (ref 0–44)
AST: 22 U/L (ref 15–41)
Albumin: 3.2 g/dL — ABNORMAL LOW (ref 3.5–5.0)
Alkaline Phosphatase: 163 U/L — ABNORMAL HIGH (ref 38–126)
Anion gap: 11 (ref 5–15)
BUN: 7 mg/dL (ref 6–20)
CO2: 22 mmol/L (ref 22–32)
Calcium: 9.2 mg/dL (ref 8.9–10.3)
Chloride: 107 mmol/L (ref 98–111)
Creatinine, Ser: 0.6 mg/dL (ref 0.44–1.00)
GFR, Estimated: >60 mL/min (ref >60)
Glucose, Bld: 111 mg/dL — ABNORMAL HIGH (ref 70–99)
Potassium: 3.7 mmol/L (ref 3.5–5.1)
Sodium: 140 mmol/L (ref 135–145)
Total Bilirubin: 0.8 mg/dL (ref 0.3–1.2)
Total Protein: 6.8 g/dL (ref 6.5–8.1)

## 2022-10-25 LAB — CBC
HCT: 44.2 % (ref 36.0–46.0)
Hemoglobin: 14.3 g/dL (ref 12.0–15.0)
MCH: 26.2 pg (ref 26.0–34.0)
MCHC: 32.4 g/dL (ref 30.0–36.0)
MCV: 81 fL (ref 80.0–100.0)
Platelets: 222 10*3/uL (ref 150–400)
RBC: 5.46 MIL/uL — ABNORMAL HIGH (ref 3.87–5.11)
RDW: 14.5 % (ref 11.5–15.5)
WBC: 14.2 10*3/uL — ABNORMAL HIGH (ref 4.0–10.5)
nRBC: 0 % (ref 0.0–0.2)

## 2022-10-25 LAB — PROTEIN / CREATININE RATIO, URINE
Creatinine, Urine: 30 mg/dL
Total Protein, Urine: <6 mg/dL

## 2022-10-25 LAB — HIV ANTIBODY (ROUTINE TESTING W REFLEX): HIV Screen 4th Generation wRfx: NONREACTIVE

## 2022-10-25 LAB — POCT FERN TEST

## 2022-10-25 MED ORDER — SOD CITRATE-CITRIC ACID 500-334 MG/5ML PO SOLN: 30.0000 mL | ORAL | Status: DC | PRN

## 2022-10-25 MED ORDER — LACTATED RINGERS IV SOLN: INTRAVENOUS | Status: DC

## 2022-10-25 MED ORDER — HYDROXYZINE HCL 50 MG PO TABS: 50.0000 mg | ORAL_TABLET | Freq: Four times a day (QID) | ORAL | Status: DC | PRN

## 2022-10-25 MED ORDER — OXYTOCIN-SODIUM CHLORIDE 30-0.9 UT/500ML-% IV SOLN
2.5000 [IU]/h | INTRAVENOUS | Status: DC
  Administered 2022-10-26: 2.5 [IU]/h via INTRAVENOUS

## 2022-10-25 MED ORDER — PENICILLIN G POT IN DEXTROSE 60000 UNIT/ML IV SOLN
3.0000 10*6.[IU] | INTRAVENOUS | Status: DC
  Administered 2022-10-25 – 2022-10-26 (×4): 3 10*6.[IU] via INTRAVENOUS
  Filled 2022-10-25 (×5): qty 50

## 2022-10-25 MED ORDER — OXYTOCIN-SODIUM CHLORIDE 30-0.9 UT/500ML-% IV SOLN
1.0000 m[IU]/min | INTRAVENOUS | Status: DC
  Administered 2022-10-26: 2 m[IU]/min via INTRAVENOUS
  Filled 2022-10-25 (×3): qty 500

## 2022-10-25 MED ORDER — PHENYLEPHRINE 80 MCG/ML (10ML) SYRINGE FOR IV PUSH (FOR BLOOD PRESSURE SUPPORT): 80.0000 ug | PREFILLED_SYRINGE | INTRAVENOUS | Status: DC | PRN

## 2022-10-25 MED ORDER — EPHEDRINE 5 MG/ML INJ: 10.0000 mg | INTRAVENOUS | Status: DC | PRN

## 2022-10-25 MED ORDER — FENTANYL-BUPIVACAINE-NACL 0.5-0.125-0.9 MG/250ML-% EP SOLN
12.0000 mL/h | EPIDURAL | Status: DC | PRN
  Administered 2022-10-25 – 2022-10-26 (×2): 12 mL/h via EPIDURAL
  Filled 2022-10-25 (×2): qty 250

## 2022-10-25 MED ORDER — OXYTOCIN BOLUS FROM INFUSION
333.0000 mL | Freq: Once | INTRAVENOUS | Status: AC
  Administered 2022-10-26: 333 mL via INTRAVENOUS

## 2022-10-25 MED ORDER — LIDOCAINE HCL (PF) 1 % IJ SOLN: 30.0000 mL | INTRAMUSCULAR | Status: DC | PRN

## 2022-10-25 MED ORDER — LIDOCAINE HCL (PF) 1 % IJ SOLN
INTRAMUSCULAR | Status: DC | PRN
  Administered 2022-10-25 (×2): 4 mL via EPIDURAL

## 2022-10-25 MED ORDER — OXYCODONE-ACETAMINOPHEN 5-325 MG PO TABS: 2.0000 | ORAL_TABLET | ORAL | Status: DC | PRN

## 2022-10-25 MED ORDER — SODIUM CHLORIDE 0.9 % IV SOLN
5.0000 10*6.[IU] | Freq: Once | INTRAVENOUS | Status: AC
  Administered 2022-10-25: 5 10*6.[IU] via INTRAVENOUS
  Filled 2022-10-25: qty 5

## 2022-10-25 MED ORDER — TERBUTALINE SULFATE 1 MG/ML IJ SOLN: 0.2500 mg | Freq: Once | INTRAMUSCULAR | Status: DC | PRN

## 2022-10-25 MED ORDER — OXYCODONE-ACETAMINOPHEN 5-325 MG PO TABS: 1.0000 | ORAL_TABLET | ORAL | Status: DC | PRN

## 2022-10-25 MED ORDER — ONDANSETRON HCL 4 MG/2ML IJ SOLN
4.0000 mg | Freq: Four times a day (QID) | INTRAMUSCULAR | Status: DC | PRN
  Administered 2022-10-26 (×2): 4 mg via INTRAVENOUS
  Filled 2022-10-25 (×2): qty 2

## 2022-10-25 MED ORDER — ACETAMINOPHEN 325 MG PO TABS: 650.0000 mg | ORAL_TABLET | ORAL | Status: DC | PRN

## 2022-10-25 MED ORDER — LACTATED RINGERS IV SOLN: 500.0000 mL | INTRAVENOUS | Status: DC | PRN

## 2022-10-25 MED ORDER — DIPHENHYDRAMINE HCL 50 MG/ML IJ SOLN
12.5000 mg | INTRAMUSCULAR | Status: DC | PRN
  Administered 2022-10-26: 12.5 mg via INTRAVENOUS
  Filled 2022-10-25: qty 1

## 2022-10-25 MED ORDER — LACTATED RINGERS IV SOLN
500.0000 mL | Freq: Once | INTRAVENOUS | Status: AC
  Administered 2022-10-25: 500 mL via INTRAVENOUS

## 2022-10-25 NOTE — H&P (Signed)
OB History and Physical   Annette Parks is a 32 y.o. female G3P0020 presenting for leakage of fluid at [redacted]w[redacted]d, reportedly feeling wetness since membrane sweep today.   She has a history of Hashimoto's thyroiditis followed by endocrinology, migraine, and incidental finding of probably spina bifida at S1 (seen on X ray 08/2017).  Pregnancy course is notable for LGA.  [place] at 36 weeks: EFW = 3432g(7'9oz) = 95%, AC > 97%**, AFI = 15.5 cm = 57%.  Rh positive, GBS positive, panorama low risk.   She had a 1x elevation of blood pressure at 38 weeks that resolved on follow up recheck. In MAU, BP are mildly elevated.    OB History     Gravida  3   Para      Term      Preterm      AB  2   Living         SAB  2   IAB      Ectopic      Multiple      Live Births             Past Medical History:  Diagnosis Date   Anal fissure    Hashimoto's disease    IBS (irritable bowel syndrome)    Migraine headache    Pelvic floor dysfunction?    Perforated eardrum    PONV (postoperative nausea and vomiting)    Pregnancy induced hypertension    UTI (urinary tract infection)    Vaginal Pap smear, abnormal    Past Surgical History:  Procedure Laterality Date   DILATION AND EVACUATION  09/06/2021   EAR TUBE REMOVAL     TONSILLECTOMY AND ADENOIDECTOMY     TYMPANOPLASTY Right    TYMPANOSTOMY TUBE PLACEMENT     x 3   WISDOM TOOTH EXTRACTION     Family History: family history includes Asthma in her brother; Diabetes in her maternal grandmother; Diverticulitis in her father; Endometriosis in her mother; Hashimoto's thyroiditis in her mother; Hyperlipidemia in her father; Migraines in her mother. Social History:  reports that she has never smoked. She has never used smokeless tobacco. She reports that she does not currently use alcohol. She reports that she does not use drugs.     Maternal Diabetes: No Genetic Screening: Normal Maternal Ultrasounds/Referrals: Normal Fetal  Ultrasounds or other Referrals:  None Maternal Substance Abuse:  No Significant Maternal Medications:  None Significant Maternal Lab Results:  Group B Strep positive Other Comments:  None  Review of Systems - Patient denies fever, chills, SOB, CP, N/V/D.  History Dilation: 1 Effacement (%): 50 Station: -2 Exam by:: Erle Crocker, RN Blood pressure (!) 143/98, pulse (!) 111, temperature 98.4 F (36.9 C), temperature source Oral, resp. rate 18, height 5\' 5"  (1.651 m), weight 83.4 kg, last menstrual period 11/14/2020, SpO2 100 %, unknown if currently breastfeeding. Exam Physical Exam   Gen: alert, well appearing, no distress Chest: nonlabored breathing CV: no peripheral edema Abdomen: soft, gravid  Ext: no evidence of DVT  Prenatal labs: ABO, Rh: --/--/PENDING (06/28 1901) Antibody: PENDING (06/28 1901) Rubella: Immune (12/01 0000) RPR: Nonreactive (12/01 0000)  HBsAg: Negative (12/01 0000)  HIV: Non-reactive (12/01 0000)  GBS: Positive/-- (06/03 0000)   FHT cat 1  Assessment/Plan: Admit to Labor and Delivery Augment with pitocin 2x2 as needed Epidural when desired GBS positive - PCN for prophylaxis LGA - discussed risk of shoulder dystocia  Lyn Henri 10/25/2022, 7:38 PM

## 2022-10-25 NOTE — MAU Note (Signed)
Patient reports contractions/cramping at 1100, which intensified after membrane sweep this AM. Has been feeling "wet" since this sweep.

## 2022-10-25 NOTE — MAU Note (Signed)
...  Annette Parks is a 32 y.o. at [redacted]w[redacted]d here in MAU reporting: CTX's having her membranes swept this morning around 1000. She reports she was 1 cm. Denies LOF. +FM.  GBS+. Per note in epic "Pregnancy course is notable for LGA.  [place] at 36 weeks: EFW = 3432g(7'9oz) = 95%, AC > 97%**, AFI = 15.5 cm = 57%." Scheduled for induction at midnight.  Onset of complaint: 1000 Pain score: 5/10 lower abdomen  FHT: 155 initial external Lab orders placed from triage:  MAU Labor Eval

## 2022-10-25 NOTE — Anesthesia Procedure Notes (Signed)
Epidural Patient location during procedure: OB Start time: 10/25/2022 9:51 PM End time: 10/25/2022 9:54 PM  Staffing Anesthesiologist: Kaylyn Layer, MD Performed: anesthesiologist   Preanesthetic Checklist Completed: patient identified, IV checked, risks and benefits discussed, monitors and equipment checked, pre-op evaluation and timeout performed  Epidural Patient position: sitting Prep: DuraPrep and site prepped and draped Patient monitoring: continuous pulse ox, blood pressure and heart rate Approach: midline Location: L3-L4 Injection technique: LOR air  Needle:  Needle type: Tuohy  Needle gauge: 17 G Needle length: 9 cm Needle insertion depth: 4 cm Catheter type: closed end flexible Catheter size: 19 Gauge Catheter at skin depth: 9 cm Test dose: negative and Other (1% lidocaine)  Assessment Events: blood not aspirated, no cerebrospinal fluid, injection not painful, no injection resistance, no paresthesia and negative IV test  Additional Notes Patient identified. Risks, benefits, and alternatives discussed with patient including but not limited to bleeding, infection, nerve damage, paralysis, failed block, incomplete pain control, headache, blood pressure changes, nausea, vomiting, reactions to medication, itching, and postpartum back pain. Confirmed with bedside nurse the patient's most recent platelet count. Confirmed with patient that they are not currently taking any anticoagulation, have any bleeding history, or any family history of bleeding disorders. Patient expressed understanding and wished to proceed. All questions were answered. Sterile technique was used throughout the entire procedure. Please see nursing notes for vital signs.   Crisp LOR on first pass. Test dose was given through epidural catheter and negative prior to continuing to dose epidural or start infusion. Warning signs of high block given to the patient including shortness of breath,  tingling/numbness in hands, complete motor block, or any concerning symptoms with instructions to call for help. Patient was given instructions on fall risk and not to get out of bed. All questions and concerns addressed with instructions to call with any issues or inadequate analgesia.  Reason for block:procedure for pain

## 2022-10-25 NOTE — H&P (Signed)
OB History and Physical Pre-admission H&P for scheduled induction of labor.   Annette Parks is a 32 y.o. female G3P0020 presenting for induction of labor at [redacted]w[redacted]d.  She has a history of Hashimoto's thyroiditis followed by endocrinology, migraine, and incidental finding of probably spina bifida at S1 (seen on X ray 08/2017).  Pregnancy course is notable for LGA.  [place] at 36 weeks: EFW = 3432g(7'9oz) = 95%, AC > 97%**, AFI = 15.5 cm = 57%.  Rh positive, GBS positive, panorama low risk.   She had a 1x elevation of blood pressure at 38 weeks that resolved on follow up recheck.    OB History     Gravida  3   Para      Term      Preterm      AB  2   Living         SAB  2   IAB      Ectopic      Multiple      Live Births             Past Medical History:  Diagnosis Date   Anal fissure    Hashimoto's disease    IBS (irritable bowel syndrome)    Migraine headache    Pelvic floor dysfunction?    Perforated eardrum    PONV (postoperative nausea and vomiting)    Pregnancy induced hypertension    UTI (urinary tract infection)    Vaginal Pap smear, abnormal    Past Surgical History:  Procedure Laterality Date   DILATION AND EVACUATION  09/06/2021   EAR TUBE REMOVAL     TONSILLECTOMY AND ADENOIDECTOMY     TYMPANOPLASTY Right    TYMPANOSTOMY TUBE PLACEMENT     x 3   WISDOM TOOTH EXTRACTION     Family History: family history includes Asthma in her brother; Diabetes in her maternal grandmother; Diverticulitis in her father; Endometriosis in her mother; Hashimoto's thyroiditis in her mother; Hyperlipidemia in her father; Migraines in her mother. Social History:  reports that she has never smoked. She has never used smokeless tobacco. She reports that she does not currently use alcohol. She reports that she does not use drugs.     Maternal Diabetes: No Genetic Screening: Normal Maternal Ultrasounds/Referrals: Normal Fetal Ultrasounds or other Referrals:   None Maternal Substance Abuse:  No Significant Maternal Medications:  None Significant Maternal Lab Results:  Group B Strep positive Other Comments:  None  Review of Systems - Patient denies fever, chills, SOB, CP, N/V/D.  History   Last menstrual period 11/14/2020, unknown if currently breastfeeding. Exam Physical Exam   Gen: alert, well appearing, no distress Chest: nonlabored breathing CV: no peripheral edema Abdomen: soft, gravid  Ext: no evidence of DVT  Prenatal labs: ABO, Rh: B/Positive/-- (12/01 0000) Antibody: Negative (12/01 0000) Rubella: Immune (12/01 0000) RPR: Nonreactive (12/01 0000)  HBsAg: Negative (12/01 0000)  HIV: Non-reactive (12/01 0000)  GBS: Positive/-- (06/03 0000)   Assessment/Plan: Admit to Labor and Delivery Cytotec for cervical ripening, followed by pitocin, AROM Epidural when desired GBS positive - PCN for prophylaxis LGA - discussed risk of shoulder dystocia  Lyn Henri 10/25/2022, 9:27 AM

## 2022-10-25 NOTE — Anesthesia Preprocedure Evaluation (Signed)
Anesthesia Evaluation  Patient identified by MRN, date of birth, ID band Patient awake    Reviewed: Allergy & Precautions, Patient's Chart, lab work & pertinent test results  History of Anesthesia Complications (+) PONV and history of anesthetic complications  Airway Mallampati: II  TM Distance: >3 FB Neck ROM: Full    Dental no notable dental hx.    Pulmonary neg pulmonary ROS   Pulmonary exam normal        Cardiovascular negative cardio ROS Normal cardiovascular exam     Neuro/Psych  Headaches  Anxiety        GI/Hepatic negative GI ROS, Neg liver ROS,,,  Endo/Other  negative endocrine ROS    Renal/GU negative Renal ROS  negative genitourinary   Musculoskeletal negative musculoskeletal ROS (+)    Abdominal   Peds  Hematology negative hematology ROS (+)   Anesthesia Other Findings Day of surgery medications reviewed with patient.  Reproductive/Obstetrics (+) Pregnancy                              Anesthesia Physical Anesthesia Plan  ASA: 2  Anesthesia Plan: Epidural   Post-op Pain Management:    Induction:   PONV Risk Score and Plan: Treatment may vary due to age or medical condition  Airway Management Planned: Natural Airway  Additional Equipment: Fetal Monitoring  Intra-op Plan:   Post-operative Plan:   Informed Consent: I have reviewed the patients History and Physical, chart, labs and discussed the procedure including the risks, benefits and alternatives for the proposed anesthesia with the patient or authorized representative who has indicated his/her understanding and acceptance.       Plan Discussed with:   Anesthesia Plan Comments:          Anesthesia Quick Evaluation

## 2022-10-26 ENCOUNTER — Inpatient Hospital Stay (HOSPITAL_COMMUNITY): Admission: RE | Admit: 2022-10-26 | Payer: 59 | Source: Home / Self Care | Admitting: Family Medicine

## 2022-10-26 ENCOUNTER — Encounter (HOSPITAL_COMMUNITY): Payer: Self-pay | Admitting: Obstetrics and Gynecology

## 2022-10-26 ENCOUNTER — Inpatient Hospital Stay (HOSPITAL_COMMUNITY): Payer: 59

## 2022-10-26 LAB — RPR: RPR Ser Ql: NONREACTIVE

## 2022-10-26 MED ORDER — ZOLPIDEM TARTRATE 5 MG PO TABS: 5.0000 mg | ORAL_TABLET | Freq: Every evening | ORAL | Status: DC | PRN

## 2022-10-26 MED ORDER — HYDROCORTISONE 0.5 % EX CREA: TOPICAL_CREAM | Freq: Two times a day (BID) | CUTANEOUS | Status: DC | PRN

## 2022-10-26 MED ORDER — BENZOCAINE-MENTHOL 20-0.5 % EX AERO
1.0000 | INHALATION_SPRAY | CUTANEOUS | Status: DC | PRN
  Administered 2022-10-26: 1 via TOPICAL
  Filled 2022-10-26: qty 56

## 2022-10-26 MED ORDER — COCONUT OIL OIL: 1.0000 | TOPICAL_OIL | Status: DC | PRN

## 2022-10-26 MED ORDER — IBUPROFEN 600 MG PO TABS
600.0000 mg | ORAL_TABLET | Freq: Four times a day (QID) | ORAL | Status: DC
  Administered 2022-10-26 – 2022-10-28 (×7): 600 mg via ORAL
  Filled 2022-10-26 (×7): qty 1

## 2022-10-26 MED ORDER — ACETAMINOPHEN 325 MG PO TABS
650.0000 mg | ORAL_TABLET | ORAL | Status: DC | PRN
  Administered 2022-10-26: 650 mg via ORAL
  Filled 2022-10-26: qty 2

## 2022-10-26 MED ORDER — SODIUM CHLORIDE 0.9 % IV SOLN
2.0000 g | Freq: Once | INTRAVENOUS | Status: AC
  Administered 2022-10-26: 2 g via INTRAVENOUS
  Filled 2022-10-26: qty 2000

## 2022-10-26 MED ORDER — WITCH HAZEL-GLYCERIN EX PADS: 1.0000 | MEDICATED_PAD | CUTANEOUS | Status: DC | PRN

## 2022-10-26 MED ORDER — GENTAMICIN SULFATE 40 MG/ML IJ SOLN
5.0000 mg/kg | INTRAVENOUS | Status: DC
  Administered 2022-10-26: 340 mg via INTRAVENOUS
  Filled 2022-10-26: qty 8.5

## 2022-10-26 MED ORDER — ONDANSETRON HCL 4 MG/2ML IJ SOLN: 4.0000 mg | INTRAMUSCULAR | Status: DC | PRN

## 2022-10-26 MED ORDER — DIBUCAINE (PERIANAL) 1 % EX OINT: 1.0000 | TOPICAL_OINTMENT | CUTANEOUS | Status: DC | PRN

## 2022-10-26 MED ORDER — SODIUM CHLORIDE 0.9 % IV SOLN
1.0000 g | INTRAVENOUS | Status: DC
  Filled 2022-10-26 (×3): qty 1000

## 2022-10-26 MED ORDER — OXYCODONE HCL 5 MG PO TABS: 5.0000 mg | ORAL_TABLET | ORAL | Status: DC | PRN

## 2022-10-26 MED ORDER — TETANUS-DIPHTH-ACELL PERTUSSIS 5-2.5-18.5 LF-MCG/0.5 IM SUSY: 0.5000 mL | PREFILLED_SYRINGE | Freq: Once | INTRAMUSCULAR | Status: DC

## 2022-10-26 MED ORDER — DIPHENHYDRAMINE HCL 25 MG PO CAPS: 25.0000 mg | ORAL_CAPSULE | Freq: Four times a day (QID) | ORAL | Status: DC | PRN

## 2022-10-26 MED ORDER — SIMETHICONE 80 MG PO CHEW: 80.0000 mg | CHEWABLE_TABLET | ORAL | Status: DC | PRN

## 2022-10-26 MED ORDER — ONDANSETRON HCL 4 MG PO TABS: 4.0000 mg | ORAL_TABLET | ORAL | Status: DC | PRN

## 2022-10-26 MED ORDER — PRENATAL MULTIVITAMIN CH
1.0000 | ORAL_TABLET | Freq: Every day | ORAL | Status: DC
  Administered 2022-10-27 – 2022-10-28 (×2): 1 via ORAL
  Filled 2022-10-26 (×2): qty 1

## 2022-10-26 MED ORDER — ACETAMINOPHEN 500 MG PO TABS
1000.0000 mg | ORAL_TABLET | Freq: Once | ORAL | Status: AC
  Administered 2022-10-26: 1000 mg via ORAL

## 2022-10-26 MED ORDER — SENNOSIDES-DOCUSATE SODIUM 8.6-50 MG PO TABS
2.0000 | ORAL_TABLET | ORAL | Status: DC
  Administered 2022-10-26 – 2022-10-28 (×2): 2 via ORAL
  Filled 2022-10-26 (×3): qty 2

## 2022-10-26 NOTE — Plan of Care (Signed)

## 2022-10-26 NOTE — Progress Notes (Signed)
Labor Progress Note  Cervix 9 cm with anterior lip, station much lower at 0 to +1. Will continue position changes.  BP (!) 128/92   Pulse (!) 124   Temp 98.7 F (37.1 C) (Oral)   Resp 16   Ht 5\' 5"  (1.651 m)   Wt 83.4 kg   LMP 11/14/2020 Comment: D & E 09/06/21 - still bleeding from procedure  SpO2 100%   BMI 30.59 kg/m   FHT cat 1  Continue current management, await complete dilation.   Nilda Simmer

## 2022-10-26 NOTE — Progress Notes (Signed)
Labor Progress Note  Cervix with interval change from 2 to 8 cm.  FHT cat 1, continue pitocin.  Discussed progress with patient, will continue to monitor labor curve. Again reiterated implications and risks of LGA, including SD.  Annette Parks

## 2022-10-27 LAB — CBC
HCT: 29.5 % — ABNORMAL LOW (ref 36.0–46.0)
Hemoglobin: 9.7 g/dL — ABNORMAL LOW (ref 12.0–15.0)
MCH: 27.5 pg (ref 26.0–34.0)
MCHC: 32.9 g/dL (ref 30.0–36.0)
MCV: 83.6 fL (ref 80.0–100.0)
Platelets: 172 10*3/uL (ref 150–400)
RBC: 3.53 MIL/uL — ABNORMAL LOW (ref 3.87–5.11)
RDW: 14.5 % (ref 11.5–15.5)
WBC: 19.2 10*3/uL — ABNORMAL HIGH (ref 4.0–10.5)
nRBC: 0 % (ref 0.0–0.2)

## 2022-10-27 MED ORDER — DOCUSATE SODIUM 100 MG PO CAPS
100.0000 mg | ORAL_CAPSULE | Freq: Every day | ORAL | Status: DC
  Administered 2022-10-27 – 2022-10-28 (×2): 100 mg via ORAL
  Filled 2022-10-27 (×2): qty 1

## 2022-10-27 MED ORDER — FERROUS SULFATE 325 (65 FE) MG PO TABS
325.0000 mg | ORAL_TABLET | ORAL | Status: DC
  Administered 2022-10-27: 325 mg via ORAL
  Filled 2022-10-27: qty 1

## 2022-10-27 NOTE — Anesthesia Postprocedure Evaluation (Signed)
Anesthesia Post Note  Patient: Annette Parks  Procedure(s) Performed: AN AD HOC LABOR EPIDURAL     Patient location during evaluation: Mother Baby Anesthesia Type: Epidural Level of consciousness: awake and alert Pain management: pain level controlled Vital Signs Assessment: post-procedure vital signs reviewed and stable Respiratory status: spontaneous breathing, nonlabored ventilation and respiratory function stable Cardiovascular status: stable Postop Assessment: no headache, no backache, epidural receding, no apparent nausea or vomiting, patient able to bend at knees, able to ambulate and adequate PO intake Anesthetic complications: no   No notable events documented.  Last Vitals:  Vitals:   10/27/22 0634 10/27/22 0928  BP: 108/74 110/74  Pulse: 91 97  Resp: 20 20  Temp: 36.6 C 36.5 C  SpO2: 100% 100%    Last Pain:  Vitals:   10/27/22 1133  TempSrc:   PainSc: 0-No pain   Pain Goal:                   [place]

## 2022-10-27 NOTE — Progress Notes (Signed)
Postpartum Progress Note  Post Partum Day 1 s/p spontaneous vaginal delivery.  Patient reports well-controlled pain, ambulating without difficulty, voiding spontaneously, tolerating PO.  Vaginal bleeding is appropriate.  She reports some exertional SOB yesterday, but denies SOB, CP, palpitations while at rest.   Objective: Blood pressure 108/74, pulse 91, temperature 97.8 F (36.6 C), temperature source Oral, resp. rate 20, height 5\' 5"  (1.651 m), weight 83.4 kg, last menstrual period 11/14/2020, SpO2 100 %, unknown if currently breastfeeding.  Physical Exam:  General: alert and no distress Lochia: appropriate Uterine Fundus: firm DVT Evaluation: No evidence of DVT seen on physical exam.  Recent Labs    10/25/22 1903 10/27/22 0456  HGB 14.3 9.7*  HCT 44.2 29.5*    Assessment/Plan: Postpartum Day 1, s/p vaginal delivery. Continue routine postpartum care Acute blood loss anemia - Fe/Colace. No signs or symptoms of anemia.  Baby boy - desires circ. Will perform if cleared by nursery, likely tomorrow Anticipate discharge home PPD#2   LOS: 2 days   Lyn Henri 10/27/2022, 9:02 AM

## 2022-10-27 NOTE — Lactation Note (Signed)
This note was copied from a baby's chart. Lactation Consultation Note  Patient Name: Annette Parks WGNFA'O Date: 10/27/2022 Age:32 hours Reason for consult: Initial assessment;Primapara;1st time breastfeeding;Term;Infant weight loss;Breastfeeding assistance (2.26%WL)  The infant was at 2 hours old.  Per the birth parent things are going well with breastfeeding.  She stated that she has a blister on her right nipple that happened yesterday.  LC assessed the tissue and a blister was noted on the birth parent's right nipple.  The infant began showing feeding cues. The birth parent latched the infant to the left breast in the cross-cradle position.  LC spoke with the parents about body alignment, pillow placement, and how to stimulate the infant to keep him sucking.  The infant was latched with his tongue down, lips were flanged, sucking was rhythmic, and some swallows were noted with stimulation. LC educated the parents on infant stomach size, feeding frequency, feeding duration, supply and demand, milk production, and cluster feeding.  The birth parent was given coconut oil for her sore nipple.  All questions were answered.   Infant Feeding Plan:  Breastfeed 8+ times in 24 hours according to feeding cues.  Hand express for stimulation and feed the expressed milk to the infant via a spoon.  Call RN/LC for assistance with breastfeeding.   Maternal Data Has patient been taught Hand Expression?: Yes Does the patient have breastfeeding experience prior to this delivery?: No  Feeding Mother's Current Feeding Choice: Breast Milk  LATCH Score Latch: Grasps breast easily, tongue down, lips flanged, rhythmical sucking.  Audible Swallowing: A few with stimulation  Type of Nipple: Everted at rest and after stimulation  Comfort (Breast/Nipple): Filling, red/small blisters or bruises, mild/mod discomfort  Hold (Positioning): No assistance needed to correctly position infant at  breast.  LATCH Score: 8   Lactation Tools Discussed/Used Tools: Coconut oil  Interventions Interventions: Breast feeding basics reviewed;Assisted with latch;Adjust position;Support pillows;Education;LC Services brochure;Coconut oil  Discharge Pump: DEBP;Personal  Consult Status Consult Status: Follow-up Date: 10/28/22 Follow-up type: In-patient   Delene Loll 10/27/2022, 10:47 AM

## 2022-10-27 NOTE — Progress Notes (Signed)
Pt up to bathroom for second time since arriving to [facility], this time ambulating with RN. Pt tolerated well until standing up from toilet and walking to sink to wash hands and c/o pain in mid/lower back, worse with inspiration and then c/o chest discomfort and SOB. Denies dizziness, able to ambulate back to bed with RN assist. Pt with slight tachypnea with RR 28, O2 sat 100% on room air. Pt reports discomfort and ease of breathing upon sitting back down. RR returned to normal. Instructed pt to not get up unassisted if dizzy and to call out if experiences similar symptoms again while ambulating.

## 2022-10-28 ENCOUNTER — Encounter: Payer: Self-pay | Admitting: Family Medicine

## 2022-10-28 NOTE — Discharge Summary (Signed)
Postpartum Discharge Summary  Date of Service updated 10/28/22     Patient Name: Annette Parks DOB: [birth year] MRN: [id number]  Date of admission: 10/25/2022 Delivery date:10/26/2022  Delivering provider: Damaris Hippo A  Date of discharg: 10/28/2022  Admitting diagnosis: Pregnancy [Z34.90] Intrauterine pregnancy: [redacted]w[redacted]d     Secondary diagnosis:  Principal Problem:   Pregnancy  Additional problems: None    Discharge diagnosis: Term Pregnancy Delivered                                              Post partum procedures: None Augmentation: Pitocin Complications: None  Hospital course: Onset of Labor With Vaginal Delivery      32 y.o. yo [id number] at [redacted]w[redacted]d was admitted in Latent Labor on 10/25/2022. Labor course was complicated by SROM and GBS positive.   Membrane Rupture Time/Date:  ,   Delivery Method:Vaginal, Spontaneous  Episiotomy: None  Lacerations:  Vaginal;Labial;Sulcus;2nd degree  Patient had a postpartum course complicated by none.  She is ambulating, tolerating a regular diet, passing flatus, and urinating well. Patient is discharged home in stable condition on 10/28/22.  Newborn Data: Birth date:10/26/2022  Birth time:6:35 PM  Gender:Female  Living status:Living  Apgars:9 ,9  Weight:3980 g   Magnesium Sulfate received: No BMZ received: No Rhophylac:N/A MMR:N/A T-DaP:Given prenatally Flu: N/A Transfusion:No  Physical exam  Vitals:   10/27/22 0634 10/27/22 0928 10/27/22 2044 10/28/22 0530  BP: 108/74 110/74 112/72 115/79  Pulse: 91 97 90 93  Resp: 20 20 20 16   Temp: 97.8 F (36.6 C) 97.7 F (36.5 C) 98 F (36.7 C) 97.8 F (36.6 C)  TempSrc: Oral Oral Oral Oral  SpO2: 100% 100%  100%  Weight:      Height:       General: alert Lochia: appropriate Uterine Fundus: firm Incision: N/A DVT Evaluation: No evidence of DVT seen on physical exam. Negative Homan's sign. Labs: Lab Results  Component Value Date   WBC 19.2 (H) 10/27/2022   HGB 9.7 (L)  10/27/2022   HCT 29.5 (L) 10/27/2022   MCV 83.6 10/27/2022   PLT 172 10/27/2022      Latest Ref Rng & Units 10/25/2022    7:03 PM  CMP  Glucose 70 - 99 mg/dL 147   BUN 6 - 20 mg/dL 7   Creatinine 8.29 - 5.62 mg/dL 1.30   Sodium 865 - 784 mmol/L 140   Potassium 3.5 - 5.1 mmol/L 3.7   Chloride 98 - 111 mmol/L 107   CO2 22 - 32 mmol/L 22   Calcium 8.9 - 10.3 mg/dL 9.2   Total Protein 6.5 - 8.1 g/dL 6.8   Total Bilirubin 0.3 - 1.2 mg/dL 0.8   Alkaline Phos 38 - 126 U/L 163   AST 15 - 41 U/L 22   ALT 0 - 44 U/L 19    Edinburgh Score:    10/28/2022    8:40 AM  Edinburgh Postnatal Depression Scale Screening Tool  I have been able to laugh and see the funny side of things. 1  I have looked forward with enjoyment to things. 0  I have blamed myself unnecessarily when things went wrong. 1  I have been anxious or worried for no good reason. 1  I have felt scared or panicky for no good reason. 1  Things have been getting on top of me.  1  I have been so unhappy that I have had difficulty sleeping. 0  I have felt sad or miserable. 0  I have been so unhappy that I have been crying. 0  The thought of harming myself has occurred to me. 0  Edinburgh Postnatal Depression Scale Total 5      After visit meds:  Allergies as of 10/28/2022   No Known Allergies      Medication List     TAKE these medications    PRENATAL VITAMIN PO   VITAMIN D PO       D/W patient female infant circumcision, risks/benefits reviewed. All questions answered.   Discharge home in stable condition Infant Feeding: Bottle and Breast Infant Disposition:home with mother Discharge instruction: per After Visit Summary and Postpartum booklet. Activity: Advance as tolerated. Pelvic rest for 6 weeks.  Diet: routine diet Anticipated Birth Control: Unsure Postpartum Appointment:6 weeks Additional Postpartum F/U:  None Future Appointments: Future Appointments  Date Time Provider Department Center  01/13/2023   2:40 PM Copland, Gwenlyn Found, MD [facility]  10/02/2023  3:20 PM Carlus Pavlov, MD [facility] None   Follow up Visit:  Follow-up Information     [facility] Follow up.   Contact information: [address] [place] [address] [phone]                     10/28/2022 Ranae Pila, MD

## 2022-10-28 NOTE — Lactation Note (Signed)
This note was copied from a baby's chart. Lactation Consultation Note  Patient Name: Annette Parks WRUEA'V Date: 10/28/2022 Age:32 years Reason for consult: Follow-up assessment;Primapara;1st time breastfeeding;Term;Infant weight loss;RN request (7 % weight loss , post circ)  Per mom having nipple soreness both breast . LC offered to assess , mom receptive and noted intact abrasions on the upper portion of both nipples.  LC offered to assist to latch and showed mom how to use the football position and worked on depth after reverse pressure exercise.  Baby latched with wide open mouth and depth and feed 30 mins. Intermittent clicking noted , no dimpling of cheeks and multiple swallows like and older baby. Per mom more comfortable into the feeding and after the baby released the nipple was well rounded and baby was satisfied.  Lc provided shells for between feedings and a hand pump for steps for latching.  LC provided a LC plan including steps for latching due to soreness. LC recommended and LC O/P appt and mom receptive.    Maternal Data Has patient been taught Hand Expression?: Yes  Feeding Mother's Current Feeding Choice: Breast Milk  LATCH Score Latch: Grasps breast easily, tongue down, lips flanged, rhythmical sucking.  Audible Swallowing: Spontaneous and intermittent  Type of Nipple: Everted at rest and after stimulation  Comfort (Breast/Nipple): Filling, red/small blisters or bruises, mild/mod discomfort  Hold (Positioning): Assistance needed to correctly position infant at breast and maintain latch.  LATCH Score: 8   Lactation Tools Discussed/Used Tools: Shells;Pump;Flanges;Coconut oil Flange Size: 21 Breast pump type: Manual Pump Education: Milk Storage;Setup, frequency, and cleaning Reason for Pumping: LC provided a LC wriiten step plan due to sore nipples and reviewed with mom and dad  Interventions    Discharge Discharge Education: Engorgement and breast  care;Warning signs for feeding baby;Outpatient recommendation;Outpatient Epic message sent;Other (comment) (mom aware she will receive a LC call) Pump: Personal;DEBP;Manual WIC Program: No  Consult Status Consult Status: Complete Date: 10/28/22    Kathrin Greathouse 10/28/2022, 1:29 PM

## 2022-10-30 NOTE — Progress Notes (Signed)
Coding query address:   Anemia is acute blood loss anemia, clinically significant.

## 2022-11-04 DIAGNOSIS — K5900 Constipation, unspecified: Secondary | ICD-10-CM | POA: Diagnosis not present

## 2022-11-04 DIAGNOSIS — N898 Other specified noninflammatory disorders of vagina: Secondary | ICD-10-CM | POA: Diagnosis not present

## 2022-11-04 DIAGNOSIS — R300 Dysuria: Secondary | ICD-10-CM | POA: Diagnosis not present

## 2022-11-23 ENCOUNTER — Telehealth (HOSPITAL_COMMUNITY): Payer: Self-pay

## 2022-11-23 NOTE — Telephone Encounter (Signed)
11/23/2022 1238  Name: Annette Parks MRN: [id number] DOB: [birth year]  Reason for Call:  Transition of Care Hospital Discharge Call  Contact Status: Patient Contact Status: Complete  Language assistant needed: Interpreter Mode: Interpreter Not Needed        Follow-Up Questions: Do You Have Any Concerns About Your Health As You Heal From Delivery?: No Do You Have Any Concerns About Your Infants Health?: No  Edinburgh Postnatal Depression Scale:  In the Past 7 Days: I have been able to laugh and see the funny side of things.: As much as I always could I have looked forward with enjoyment to things.: As much as I ever did I have blamed myself unnecessarily when things went wrong.: Not very often I have been anxious or worried for no good reason.: No, not at all I have felt scared or panicky for no good reason.: No, not much Things have been getting on top of me.: No, most of the time I have coped quite well I have been so unhappy that I have had difficulty sleeping.: Not at all I have felt sad or miserable.: No, not at all I have been so unhappy that I have been crying.: Only occasionally The thought of harming myself has occurred to me.: Never Edinburgh Postnatal Depression Scale Total: 4  PHQ2-9 Depression Scale:     Discharge Follow-up: Edinburgh score requires follow up?: No Patient was advised of the following resources:: Support Group, Breastfeeding Support Group Did patient express any COVID concerns?: No  Post-discharge interventions: Reviewed Newborn Safe Sleep Practices  Signature Signe Colt

## 2022-12-11 ENCOUNTER — Other Ambulatory Visit (HOSPITAL_COMMUNITY): Payer: Self-pay

## 2022-12-11 DIAGNOSIS — Z1389 Encounter for screening for other disorder: Secondary | ICD-10-CM | POA: Diagnosis not present

## 2022-12-11 LAB — TSH: TSH: 1.96 (ref 0.41–5.90)

## 2022-12-11 MED ORDER — ESTRADIOL 0.1 MG/GM VA CREA
5.0000 g | TOPICAL_CREAM | Freq: Every day | VAGINAL | 3 refills | Status: AC
  Filled 2022-12-11: qty 42.5, 14d supply, fill #0

## 2022-12-12 ENCOUNTER — Encounter (INDEPENDENT_AMBULATORY_CARE_PROVIDER_SITE_OTHER): Payer: Self-pay

## 2022-12-12 ENCOUNTER — Encounter: Payer: Self-pay | Admitting: Internal Medicine

## 2022-12-31 NOTE — Progress Notes (Deleted)
[facility] [address] [place], [place] [address] [phone]  Date:  01/02/2023   Name:  Annette Parks   DOB:  05-01-1990   MRN:  [id number]  PCP:  Annette Cables, MD    Chief Complaint: No chief complaint on file.   History of Present Illness:  Annette Parks is a 32 y.o. very pleasant female patient who presents with the following:  Patient seen today with concern of hand pain Most recent visit with myself was September of last year History of Hashimoto's thyroiditis managed by endocrinology She delivered a healthy baby boy this past June  Patient Active Problem List   Diagnosis Date Noted   Pregnancy 10/25/2022   Palpitations 01/18/2020   Closed fracture of fifth metacarpal bone of right hand 11/05/2019   Pain of right shoulder joint on movement 10/26/2019   Acute pain of right wrist 10/26/2019   Hashimoto's thyroiditis 09/04/2018   Hair loss 09/04/2018   Conductive hearing loss of right ear with unrestricted hearing of left ear 09/26/2017   Perforation of right tympanic membrane 09/26/2017   Hip pain, chronic, left 08/13/2017   Lower back pain 08/11/2017   Trochanteric bursitis of left hip 07/31/2017   Sacroiliac (ligament) sprain 07/31/2017   Common migraine 05/07/2016   Eye pain, bilateral 05/07/2016   Facial pain 05/07/2016   GAD (generalized anxiety disorder) 03/22/2015   Neck pain 07/11/2014   Myalgia 07/11/2014   Dysesthesia 07/11/2014    Past Medical History:  Diagnosis Date   Anal fissure    Hashimoto's disease    IBS (irritable bowel syndrome)    Migraine headache    Pelvic floor dysfunction?    Perforated eardrum    PONV (postoperative nausea and vomiting)    Pregnancy induced hypertension    UTI (urinary tract infection)    Vaginal Pap smear, abnormal     Past Surgical History:  Procedure Laterality Date   DILATION AND EVACUATION  09/06/2021   EAR TUBE REMOVAL      TONSILLECTOMY AND ADENOIDECTOMY     TYMPANOPLASTY Right    TYMPANOSTOMY TUBE PLACEMENT     x 3   WISDOM TOOTH EXTRACTION      Social History   Tobacco Use   Smoking status: Never   Smokeless tobacco: Never  Vaping Use   Vaping status: Never Used  Substance Use Topics   Alcohol use: Not Currently    Comment: occasional-2 per month   Drug use: No    Family History  Problem Relation Age of Onset   Migraines Mother    Hashimoto's thyroiditis Mother    Endometriosis Mother    Hyperlipidemia Father    Diverticulitis Father        had to have part of colon removed   Asthma Brother    Diabetes Maternal Grandmother     No Known Allergies  Medication list has been reviewed and updated.  Current Outpatient Medications on File Prior to Visit  Medication Sig Dispense Refill   Prenatal Vit-Fe Fumarate-FA (PRENATAL VITAMIN PO)      VITAMIN D PO      No current facility-administered medications on file prior to visit.    Review of Systems:  As per HPI- otherwise negative.   Physical Examination: There were no vitals filed for this visit. There were no vitals filed for this visit. There is no height or weight on file to calculate BMI. Ideal  Body Weight:    GEN: no acute distress. HEENT: Atraumatic, Normocephalic.  Ears and Nose: No external deformity. CV: RRR, No M/G/R. No JVD. No thrill. No extra heart sounds. PULM: CTA B, no wheezes, crackles, rhonchi. No retractions. No resp. distress. No accessory muscle use. ABD: S, NT, ND, +BS. No rebound. No HSM. EXTR: No c/c/e PSYCH: Normally interactive. Conversant.    Assessment and Plan: ***  Signed Abbe Amsterdam, MD

## 2023-01-02 ENCOUNTER — Ambulatory Visit: Payer: 59 | Admitting: Family Medicine

## 2023-01-06 ENCOUNTER — Ambulatory Visit: Payer: 59 | Admitting: Orthopedic Surgery

## 2023-01-06 ENCOUNTER — Other Ambulatory Visit (INDEPENDENT_AMBULATORY_CARE_PROVIDER_SITE_OTHER): Payer: 59

## 2023-01-06 DIAGNOSIS — M25532 Pain in left wrist: Secondary | ICD-10-CM

## 2023-01-06 DIAGNOSIS — M24532 Contracture, left wrist: Secondary | ICD-10-CM | POA: Diagnosis not present

## 2023-01-06 DIAGNOSIS — M654 Radial styloid tenosynovitis [de Quervain]: Secondary | ICD-10-CM

## 2023-01-06 MED ORDER — LIDOCAINE HCL 1 % IJ SOLN
1.0000 mL | INTRAMUSCULAR | Status: AC | PRN
Start: 2023-01-06 — End: 2023-01-06
  Administered 2023-01-06: 1 mL

## 2023-01-06 MED ORDER — BETAMETHASONE SOD PHOS & ACET 6 (3-3) MG/ML IJ SUSP
6.0000 mg | INTRAMUSCULAR | Status: AC | PRN
Start: 2023-01-06 — End: 2023-01-06
  Administered 2023-01-06: 6 mg via INTRA_ARTICULAR

## 2023-01-06 NOTE — Progress Notes (Addendum)
Annette Parks - 32 y.o. female MRN [id number]  Date of birth: 06/28/1990  Office Visit Note: Visit Date: 01/06/2023 PCP: Pearline Cables, MD Referred by: Pearline Cables, MD  Subjective: Chief Complaint  Patient presents with   Left Wrist - Pain   HPI: ALONNIE Parks is a pleasant 32 y.o. female who presents today for ongoing left radial sided wrist pain that has been present now for multiple weeks.  She recently had a child in June of this year, has developed radial sided wrist pain since that time radiating up the thumb and into the left radial forearm.  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  She works as a Engineer, civil (consulting), is returning to work in a couple weeks.  Pertinent ROS were reviewed with the patient and found to be negative unless otherwise specified above in HPI.   Visit Reason:left wrist  Hand dominance: left Occupation: Diabetic: no Heart/Lung History: none Blood Thinners: none  Prior Testing/EMG: none Injections (Date): none Treatments: brace, ibuprofen Prior Surgery: none   Assessment & Plan: Visit Diagnoses:  1. Tendinitis, de Quervain's   2. Pain in left wrist     Plan: Extensive discussion was had the patient today regarding her left radial sided wrist pain.  She demonstrates signs and symptoms consistent with left wrist de Quervain's tenosynovitis.  I explained her the etiology and pathophysiology of this disease.  We discussed treatment modalities ranging from conservative to surgical.  From a conservative standpoint, we discussed ongoing bracing, I can show her a better wrist and thumb spica brace today.  Also, we could augment this with a cortisone injection.  Given that she is breast-feeding, she was able to confirm with her OB/GYN who stated that the injection was safe to proceed with.    From a surgical standpoint, we discussed the possibility for first  extensor compartment release, however this would only be if her symptoms remain refractory to conservative care.  She expressed full understanding.  Cortisone injection provided today to the left wrist first extensor compartment for symptom relief.  Patient tolerated injection well.  Follow-up: No follow-ups on file.   Meds & Orders: No orders of the defined types were placed in this encounter.   Orders Placed This Encounter  Procedures   XR Wrist Complete Left     Procedures: Hand/UE Inj for de Quervain's tenosynovitis on 01/06/2023 11:25 AM Details: 25 G needle Medications: 1 mL lidocaine 1 %; 6 mg betamethasone acetate-betamethasone sodium phosphate 6 (3-3) MG/ML Outcome: tolerated well, no immediate complications         Clinical History: No specialty comments available.  She reports that she has never smoked. She has never used smokeless tobacco.  Recent Labs    01/09/22 1502  HGBA1C 5.6    Objective:   Vital Signs: LMP 11/14/2020 Comment: D & E 09/06/21 - still bleeding from procedure  Physical Exam  Gen: Well-appearing, in no acute distress; non-toxic CV: Regular Rate. Well-perfused. Warm.  Resp: Breathing unlabored on room air; no wheezing. Psych: Fluid speech in conversation; appropriate affect; normal thought process  Ortho Exam Left wrist: - Tenderness over the radial styloid, positive Finkelstein - No significant instability of the extensor compartments with wrist circumduction - Sensation intact in all distributions, hand is warm well-perfused.  Imaging: XR Wrist Complete Left  Result Date: 01/06/2023 X-rays of the left wrist, although these were obtained today  X-rays demonstrate stable appearance of the radiocarpal interval without significant bony abnormalities.  Bone mineralization is appropriate.   Past Medical/Family/Surgical/Social History: Medications & Allergies reviewed per EMR, new medications updated. Patient Active Problem List   Diagnosis  Date Noted   Pregnancy 10/25/2022   Palpitations 01/18/2020   Closed fracture of fifth metacarpal bone of right hand 11/05/2019   Pain of right shoulder joint on movement 10/26/2019   Acute pain of right wrist 10/26/2019   Hashimoto's thyroiditis 09/04/2018   Hair loss 09/04/2018   Conductive hearing loss of right ear with unrestricted hearing of left ear 09/26/2017   Perforation of right tympanic membrane 09/26/2017   Hip pain, chronic, left 08/13/2017   Lower back pain 08/11/2017   Trochanteric bursitis of left hip 07/31/2017   Sacroiliac (ligament) sprain 07/31/2017   Common migraine 05/07/2016   Eye pain, bilateral 05/07/2016   Facial pain 05/07/2016   GAD (generalized anxiety disorder) 03/22/2015   Neck pain 07/11/2014   Myalgia 07/11/2014   Dysesthesia 07/11/2014   Past Medical History:  Diagnosis Date   Anal fissure    Hashimoto's disease    IBS (irritable bowel syndrome)    Migraine headache    Pelvic floor dysfunction?    Perforated eardrum    PONV (postoperative nausea and vomiting)    Pregnancy induced hypertension    UTI (urinary tract infection)    Vaginal Pap smear, abnormal    Family History  Problem Relation Age of Onset   Migraines Mother    Hashimoto's thyroiditis Mother    Endometriosis Mother    Hyperlipidemia Father    Diverticulitis Father        had to have part of colon removed   Asthma Brother    Diabetes Maternal Grandmother    Past Surgical History:  Procedure Laterality Date   DILATION AND EVACUATION  09/06/2021   EAR TUBE REMOVAL     TONSILLECTOMY AND ADENOIDECTOMY     TYMPANOPLASTY Right    TYMPANOSTOMY TUBE PLACEMENT     x 3   WISDOM TOOTH EXTRACTION     Social History   Occupational History   Occupation: Designer, jewellery  Tobacco Use   Smoking status: Never   Smokeless tobacco: Never  Vaping Use   Vaping status: Never Used  Substance and Sexual Activity   Alcohol use: Not Currently    Comment: occasional-2 per month    Drug use: No   Sexual activity: Not Currently    Merrill Deanda Fara Boros) Denese Killings, M.D. [facility] 11:00 AM

## 2023-01-08 ENCOUNTER — Ambulatory Visit (HOSPITAL_BASED_OUTPATIENT_CLINIC_OR_DEPARTMENT_OTHER): Payer: 59 | Admitting: Orthopaedic Surgery

## 2023-01-09 NOTE — Progress Notes (Unsigned)
[facility] [address], [address] [place], [place] [address] [phone]  Date:  01/13/2023   Name:  Annette Parks   DOB:  1991-04-02   MRN:  [id number]  PCP:  Pearline Cables, MD    Chief Complaint: cpe (Concerns/ questions: pt says she is 3 months postpartum, resting HR is in the 60's/Pap: Marvel Plan /Flu shot today: pt will wait a little while longer/)   History of Present Illness:  Annette Parks is a 32 y.o. very pleasant female patient who presents with the following:  Pt seen today for CPE- history of hashimoto's thyroiditis  Last seen by myself in September 2023 She gave birth June 28h- her son Annette Parks will be 3 months soon!  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  2D echo was entirely normal and a 2-week Zio patch showed only isolated PACs.  She has been started on a beta-blocker in the past for her migraines which she did not tolerate (atenolol).  Her blood pressure is low to begin with.  We talked about pharmacologic therapy but we both agree this is not appropriate at this time.  She feels better knowing that her palpitations are benign.  I will see her back as needed. Annette Parks, M.D.,  Pt reports her TSH levels were recently checked by gyn and they looked ok - I can see the thyroid labs, all ok  Pulse Readings from Last 3 Encounters:  01/13/23 80  10/28/22 93   09/24/22 (!) 118    No sx that she has noticed- she has just seen on her watch that her resting HR may be in the 60s and maybe in the 50s when she is asleep at night  No syncope or presyncope, no CP of SOB   Patient Active Problem List   Diagnosis Date Noted   Pregnancy 10/25/2022   Palpitations 01/18/2020   Closed fracture of fifth metacarpal bone of right hand 11/05/2019   Pain of right shoulder joint on movement 10/26/2019   Acute pain of right wrist 10/26/2019   Hashimoto's thyroiditis 09/04/2018   Hair loss 09/04/2018   Conductive hearing loss of right ear with unrestricted hearing of left ear 09/26/2017   Perforation of right tympanic membrane 09/26/2017   Hip pain, chronic, left 08/13/2017   Lower back pain 08/11/2017   Trochanteric bursitis of left hip 07/31/2017   Sacroiliac (ligament) sprain 07/31/2017   Common migraine 05/07/2016   Eye pain, bilateral 05/07/2016   Facial pain 05/07/2016   GAD (generalized anxiety disorder) 03/22/2015   Neck pain 07/11/2014   Myalgia 07/11/2014   Dysesthesia 07/11/2014    Past Medical History:  Diagnosis Date   Anal fissure    Hashimoto's disease    IBS (irritable bowel syndrome)  Migraine headache    Pelvic floor dysfunction?    Perforated eardrum    PONV (postoperative nausea and vomiting)    Pregnancy induced hypertension    UTI (urinary tract infection)    Vaginal Pap smear, abnormal     Past Surgical History:  Procedure Laterality Date   DILATION AND EVACUATION  09/06/2021   EAR TUBE REMOVAL     TONSILLECTOMY AND ADENOIDECTOMY     TYMPANOPLASTY Right    TYMPANOSTOMY TUBE PLACEMENT     x 3   WISDOM TOOTH EXTRACTION      Social History   Tobacco Use   Smoking status: Never   Smokeless tobacco: Never  Vaping Use   Vaping status: Never Used  Substance Use Topics   Alcohol use: Not Currently    Comment: occasional-2 per month   Drug use: No    Family History  Problem Relation Age of Onset    Migraines Mother    Hashimoto's thyroiditis Mother    Endometriosis Mother    Hyperlipidemia Father    Diverticulitis Father        had to have part of colon removed   Asthma Brother    Diabetes Maternal Grandmother     No Known Allergies  Medication list has been reviewed and updated.  Current Outpatient Medications on File Prior to Visit  Medication Sig Dispense Refill   Prenatal Vit-Fe Fumarate-FA (PRENATAL VITAMIN PO)      VITAMIN D PO      No current facility-administered medications on file prior to visit.    Review of Systems:  As per HPI- otherwise negative.  BP Readings from Last 3 Encounters:  01/13/23 104/60  10/28/22 115/79  09/24/22 120/70    Physical Examination: Vitals:   01/13/23 1422  BP: 104/60  Pulse: 80  Resp: 18  Temp: 97.7 F (36.5 C)  SpO2: 99%   Vitals:   01/13/23 1422  Weight: 145 lb 6.4 oz (66 kg)  Height: 5\' 5"  (1.651 m)   Body mass index is 24.2 kg/m. Ideal Body Weight: Weight in (lb) to have BMI = 25: 149.9  GEN: no acute distress. Normal weight, looks well  HEENT: Atraumatic, Normocephalic.  Ears and Nose: No external deformity. CV: RRR, No M/G/R. No JVD. No thrill. No extra heart sounds. PULM: CTA B, no wheezes, crackles, rhonchi. No retractions. No resp. distress. No accessory muscle use. ABD: S, NT, ND, +BS. No rebound. No HSM. EXTR: No c/c/e PSYCH: Normally interactive. Conversant.   EKG: SR with rate of 75, WNL Assessment and Plan: Physical exam  Bradycardia - Plan: EKG 12-Lead  Vitamin D deficiency - Plan: VITAMIN D 25 Hydroxy (Vit-D Deficiency, Fractures)  Leukocytosis, unspecified type - Plan: CBC  Screening for diabetes mellitus - Plan: Comprehensive metabolic panel, Hemoglobin A1c  Physical exam today- encouraged healthy diet and exercise routine Will plan further follow- up pending labs. We discussed comparative bradycardia which she has noticed since she gave birth - EKG today is reassuring.  Offered  to repeat zio, have her see cardiology- for the time being she declines but she will let me know if she becomes more concerned   Signed Abbe Amsterdam, MD  Addnd 9/17- received labs as below, message to pt  Results for orders placed or performed in visit on 01/13/23  CBC  Result Value Ref Range   WBC 6.1 4.0 - 10.5 K/uL   RBC 5.47 (H) 3.87 - 5.11 Mil/uL   Platelets 293.0 150.0 - 400.0 K/uL  Hemoglobin 14.1 12.0 - 15.0 g/dL   HCT 47.4 25.9 - 56.3 %   MCV 80.9 78.0 - 100.0 fl   MCHC 31.8 30.0 - 36.0 g/dL   RDW 87.5 64.3 - 32.9 %  Comprehensive metabolic panel  Result Value Ref Range   Sodium 142 135 - 145 mEq/L   Potassium 4.1 3.5 - 5.1 mEq/L   Chloride 105 96 - 112 mEq/L   CO2 29 19 - 32 mEq/L   Glucose, Bld 79 70 - 99 mg/dL   BUN 12 6 - 23 mg/dL   Creatinine, Ser 5.18 0.40 - 1.20 mg/dL   Total Bilirubin 0.8 0.2 - 1.2 mg/dL   Alkaline Phosphatase 107 39 - 117 U/L   AST 13 0 - 37 U/L   ALT 18 0 - 35 U/L   Total Protein 6.8 6.0 - 8.3 g/dL   Albumin 4.3 3.5 - 5.2 g/dL   GFR 84.16 >60.63 mL/min   Calcium 9.4 8.4 - 10.5 mg/dL  Hemoglobin K1S  Result Value Ref Range   Hgb A1c MFr Bld 5.5 4.6 - 6.5 %  VITAMIN D 25 Hydroxy (Vit-D Deficiency, Fractures)  Result Value Ref Range   VITD 28.93 (L) 30.00 - 100.00 ng/mL

## 2023-01-13 ENCOUNTER — Ambulatory Visit (INDEPENDENT_AMBULATORY_CARE_PROVIDER_SITE_OTHER): Payer: 59 | Admitting: Family Medicine

## 2023-01-13 VITALS — BP 104/60 | HR 80 | Temp 97.7°F | Resp 18 | Ht 65.0 in | Wt 145.4 lb

## 2023-01-13 DIAGNOSIS — E559 Vitamin D deficiency, unspecified: Secondary | ICD-10-CM

## 2023-01-13 DIAGNOSIS — R001 Bradycardia, unspecified: Secondary | ICD-10-CM | POA: Diagnosis not present

## 2023-01-13 DIAGNOSIS — Z131 Encounter for screening for diabetes mellitus: Secondary | ICD-10-CM

## 2023-01-13 DIAGNOSIS — D72829 Elevated white blood cell count, unspecified: Secondary | ICD-10-CM | POA: Diagnosis not present

## 2023-01-13 DIAGNOSIS — Z0000 Encounter for general adult medical examination without abnormal findings: Secondary | ICD-10-CM | POA: Diagnosis not present

## 2023-01-13 NOTE — Patient Instructions (Signed)
It was great to see you today- congrats on your beautiful son I will be in touch with your labs asap Please let me know if you wish to look any further as far as your heart-rate; we are glad to make this happen anytime

## 2023-01-14 ENCOUNTER — Encounter: Payer: Self-pay | Admitting: Family Medicine

## 2023-01-14 LAB — COMPREHENSIVE METABOLIC PANEL
ALT: 18 U/L (ref 0–35)
AST: 13 U/L (ref 0–37)
Albumin: 4.3 g/dL (ref 3.5–5.2)
Alkaline Phosphatase: 107 U/L (ref 39–117)
BUN: 12 mg/dL (ref 6–23)
CO2: 29 meq/L (ref 19–32)
Calcium: 9.4 mg/dL (ref 8.4–10.5)
Chloride: 105 meq/L (ref 96–112)
Creatinine, Ser: 0.81 mg/dL (ref 0.40–1.20)
GFR: 96.13 mL/min (ref >60.00)
Glucose, Bld: 79 mg/dL (ref 70–99)
Potassium: 4.1 meq/L (ref 3.5–5.1)
Sodium: 142 meq/L (ref 135–145)
Total Bilirubin: 0.8 mg/dL (ref 0.2–1.2)
Total Protein: 6.8 g/dL (ref 6.0–8.3)

## 2023-01-14 LAB — CBC
HCT: 44.2 % (ref 36.0–46.0)
Hemoglobin: 14.1 g/dL (ref 12.0–15.0)
MCHC: 31.8 g/dL (ref 30.0–36.0)
MCV: 80.9 fl (ref 78.0–100.0)
Platelets: 293 10*3/uL (ref 150.0–400.0)
RBC: 5.47 Mil/uL — ABNORMAL HIGH (ref 3.87–5.11)
RDW: 13.6 % (ref 11.5–15.5)
WBC: 6.1 10*3/uL (ref 4.0–10.5)

## 2023-01-14 LAB — HEMOGLOBIN A1C: Hgb A1c MFr Bld: 5.5 % (ref 4.6–6.5)

## 2023-01-14 LAB — VITAMIN D 25 HYDROXY (VIT D DEFICIENCY, FRACTURES): VITD: 28.93 ng/mL — ABNORMAL LOW (ref 30.00–100.00)

## 2023-02-07 DIAGNOSIS — N898 Other specified noninflammatory disorders of vagina: Secondary | ICD-10-CM | POA: Diagnosis not present

## 2023-03-04 DIAGNOSIS — N898 Other specified noninflammatory disorders of vagina: Secondary | ICD-10-CM | POA: Diagnosis not present

## 2023-03-04 DIAGNOSIS — N760 Acute vaginitis: Secondary | ICD-10-CM | POA: Diagnosis not present

## 2023-03-04 DIAGNOSIS — R82998 Other abnormal findings in urine: Secondary | ICD-10-CM | POA: Diagnosis not present

## 2023-03-14 DIAGNOSIS — R102 Pelvic and perineal pain: Secondary | ICD-10-CM | POA: Diagnosis not present

## 2023-03-14 DIAGNOSIS — L659 Nonscarring hair loss, unspecified: Secondary | ICD-10-CM | POA: Diagnosis not present

## 2023-03-14 DIAGNOSIS — N760 Acute vaginitis: Secondary | ICD-10-CM | POA: Diagnosis not present

## 2023-03-15 ENCOUNTER — Encounter: Payer: Self-pay | Admitting: Internal Medicine

## 2023-03-21 ENCOUNTER — Ambulatory Visit: Payer: 59 | Admitting: Internal Medicine

## 2023-03-21 VITALS — BP 110/62 | HR 107 | Ht 65.0 in | Wt 136.4 lb

## 2023-03-21 DIAGNOSIS — E063 Autoimmune thyroiditis: Secondary | ICD-10-CM

## 2023-03-21 DIAGNOSIS — E0580 Other thyrotoxicosis without thyrotoxic crisis or storm: Secondary | ICD-10-CM

## 2023-03-21 LAB — T3, FREE: T3, Free: 5.7 pg/mL — ABNORMAL HIGH (ref 2.3–4.2)

## 2023-03-21 LAB — TSH: TSH: 0 u[IU]/mL — ABNORMAL LOW (ref 0.35–5.50)

## 2023-03-21 LAB — T4, FREE: Free T4: 1.86 ng/dL — ABNORMAL HIGH (ref 0.60–1.60)

## 2023-03-21 NOTE — Patient Instructions (Addendum)
Please stop at the lab.  Please return in 1.5-2 months.

## 2023-03-21 NOTE — Progress Notes (Addendum)
Patient ID: Annette Parks, female   DOB: Apr 26, 1991, 32 y.o.   MRN: [id number]   HPI  Annette Parks is a 32 y.o.-year-old very pleasant female, initially referred by her PCP, Annette Parks, returning for follow-up for Hashimoto's thyroiditis.  Last visit 6 months ago. Her mother, Annette Parks, is also my patient.  Interim hx: This at the appointment was scheduled urgently after patient had outside labs which showed thyrotoxicosis.  Retrospectively, she also had hair loss and also palpitations , anxiety, excessive weight loss, fatigue, mood swings and also increased sensitivity to light.  She saw her ophthalmologist and changed the glasses, but she still has light sensitivity. No tremors. She is b'feeding. She has good appetite. She recently tried to reintroduce coffee but could not tolerate it due to palpitations. She had Covid19 in 12/2022 after going back to work. She had gastroenteritis at the beginning of the month.  Reviewed history: She was diagnosed with Hashimoto's thyroiditis in 07/2018.  She has normal thyroid tests so she did not require levothyroxine.  Reviewed her TFTs: 03/14/2023:  Lab Results  Component Value Date   TSH 1.96 12/11/2022   TSH 2.67 01/09/2022   TSH 1.96 09/18/2021   TSH 1.60 09/18/2020   TSH 1.67 12/27/2019   TSH 1.35 09/13/2019   TSH 1.58 03/08/2019   TSH 1.66 08/20/2018   TSH 1.73 08/05/2016   TSH 1.30 02/16/2016   FREET4 0.96 09/18/2021   FREET4 0.90 09/18/2020   FREET4 0.80 09/13/2019   FREET4 1.02 03/08/2019   Her thyroid antibodies were elevated: Component     Latest Ref Rng 09/18/2020 09/18/2021  Thyroglobulin Ab     < or = 1 IU/mL 1  1   Thyroperoxidase Ab SerPl-aCnc     <9 IU/mL 94 (H)  346 (H)    Component     Latest Ref Rng & Units 09/13/2019  Thyroperoxidase Ab SerPl-aCnc     <9 IU/mL 53 (H)   Component     Latest Ref Rng & Units 03/22/2015 08/20/2018 03/08/2019  Thyroperoxidase Ab SerPl-aCnc     <9 IU/mL 2 23 (H) 46 (H)   Thyroglobulin Ab     < or = 1 IU/mL  1    We started selenium 200 mcg daily in 08/2018, but her TPO antibodies were still elevated at last check so we stopped it.  She had an ectopic pregnancy 11/2020 for which she had to get methotrexate.  Before last visit, she had a miscarriage at 42 weeks,for which she had to have a D and C.  Since then, she had a healthy pregnancy and gave birth to a healthy baby boy (Annette Parks) 10/25/2022.  She previously had mild weight gain, fatigue, cold intolerance, anxiety, constipation, hair thinning and loss.  These improved.  Her hair loss improved after starting hair skin and nails vitamins.  Then switched to prenatal vitamins.  Pt denies: - feeling nodules in neck - hoarseness - dysphagia - choking  She has + FH of thyroid disorders in: mother, MGM. No FH of thyroid cancer. No h/o radiation tx to head or neck. No herbal supplements. No Biotin use now. No recent steroids use.   Pt. also has a history of Raynauds phenomenon. She was on OCPs - since a teenager. She tried to stop x 8 mo restarted b/c acne and also dysmenorrhea.  She also has a history of heart valve insufficiency. She has neck, lower back pain, hip pain; she was diagnosed with spina bifida occulta. She  has constipation (chronic) and anal fissures. She has hyperlipidemia. She was on Amitriptyline for bladder dysfxn.  Previously had palpitations found to be PVCs, by cardiology.  No recent episodes.  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  ROS: + See HPI  I reviewed pt's medications, allergies, PMH, social hx, family hx, and changes were documented in the history of present illness. Otherwise, unchanged from my initial visit note.  Past Medical History:  Diagnosis Date   Anal fissure    Hashimoto's disease    IBS (irritable bowel syndrome)    Migraine headache    Pelvic floor dysfunction?    Perforated eardrum    PONV (postoperative nausea and vomiting)    Pregnancy induced hypertension     UTI (urinary tract infection)    Vaginal Pap smear, abnormal    Past Surgical History:  Procedure Laterality Date   DILATION AND EVACUATION  09/06/2021   EAR TUBE REMOVAL     TONSILLECTOMY AND ADENOIDECTOMY     TYMPANOPLASTY Right    TYMPANOSTOMY TUBE PLACEMENT     x 3   WISDOM TOOTH EXTRACTION     Social History   Socioeconomic History   Marital status: Married    Spouse name: Not on file   Number of children: 0   Years of education: Not on file   Highest education level: Not on file  Occupational History   Occupation: Registered nurse  Tobacco Use   Smoking status: Never   Smokeless tobacco: Never  Vaping Use   Vaping status: Never Used  Substance and Sexual Activity   Alcohol use: Not Currently    Comment: occasional-2 per month   Drug use: No   Sexual activity: Not Currently  Other Topics Concern   Not on file  Social History Narrative   Single, no children    moved here from [place] went to [facility] [place]. Moved here after her parents moved here.   Clinic nurse in [employer]   Social Determinants of Health   Financial Resource Strain: Not on file  Food Insecurity: No Food Insecurity (10/25/2022)   Hunger Vital Sign    Worried About Running Out of Food in the Last Year: Never true    Ran Out of Food in the Last Year: Never true  Transportation Needs: No Transportation Needs (10/25/2022)   PRAPARE - Administrator, Civil Service (Medical): No    Lack of Transportation (Non-Medical): No  Physical Activity: Not on file  Stress: Not on file  Social Connections: Not on file  Intimate Partner Violence: Not At Risk (10/25/2022)   Humiliation, Afraid, Rape, and Kick questionnaire    Fear of Current or Ex-Partner: No    Emotionally Abused: No    Physically Abused: No    Sexually Abused: No   Current Outpatient Medications on File Prior to Visit  Medication Sig Dispense Refill   Prenatal Vit-Fe Fumarate-FA  (PRENATAL VITAMIN PO)      VITAMIN D PO      No current facility-administered medications on file prior to visit.   No Known Allergies Family History  Problem Relation Age of Onset   Migraines Mother    Hashimoto's thyroiditis Mother    Endometriosis Mother    Hyperlipidemia Father    Diverticulitis Father        had to have part of colon removed   Asthma Brother    Diabetes Maternal Grandmother    PE: BP 110/62   Pulse (!) 107  Ht 5\' 5"  (1.651 m)   Wt 136 lb 6.4 oz (61.9 kg)   SpO2 98%   BMI 22.70 kg/m  Wt Readings from Last 15 Encounters:  03/21/23 136 lb 6.4 oz (61.9 kg)  01/13/23 145 lb 6.4 oz (66 kg)  10/25/22 183 lb 12.8 oz (83.4 kg)  09/24/22 178 lb (80.7 kg)  08/18/22 170 lb 1.6 oz (77.2 kg)  05/06/22 144 lb 6.4 oz (65.5 kg)  01/09/22 144 lb 3.2 oz (65.4 kg)  10/01/21 140 lb (63.5 kg)  09/18/21 141 lb 12.8 oz (64.3 kg)  01/08/21 132 lb (59.9 kg)  01/05/21 132 lb 8 oz (60.1 kg)  09/18/20 130 lb (59 kg)  07/11/20 129 lb (58.5 kg)  06/27/20 129 lb (58.5 kg)  04/04/20 129 lb 6.4 oz (58.7 kg)   Constitutional:  in NAD Eyes:  EOMI, no exophthalmos ENT: no thyromegaly, no thyroid bruits, no cervical lymphadenopathy Cardiovascular: Tachycardia, RR, No MRG Respiratory: CTA B Musculoskeletal: no deformities Skin: no rashes Neurological: + Very faint tremor with outstretched hands, + patellar DTR normal B  ASSESSMENT: 1. Hashimoto thyroiditis - in pregnancy  2.  Thyrotoxicosis  PLAN: 1. And 2 Patient with history of Hashimoto's thyroiditis, for which we did not have to start levothyroxine.  When I last saw her, she was in the third trimester of pregnancy, euthyroid appearing.  Of note, she previously had an ectopic pregnancy in 2023 at the miscarriage, at last visit, she had a successful pregnancy and she had 4 more weeks until term.  Since last visit, she gave birth to a healthy baby boy. -Right before last visit, her TFTs were normal so we did not have to  start levothyroxine.  She had another set of TFTs in 11/2022, ~1.5 months after giving birth, and they were also excellent.  However, she contacted me recently with an undetectable TSH 03/14/2023 along with a high TT4.  She also mentions recent onset of thyrotoxic symptoms: Hair loss, heart racing, anxiety/restlessness, weight loss (approximately 50 pounds since delivery), fatigue, mood swings and sensitivity to light -she saw her eye doctor and got new glasses. -At today's visit we discussed about differential diagnosis for her newly developed thyrotoxicosis to include viral subacute thyroiditis (possibly due to COVID19 which she had in 12/2022 after returning to work), postpartum thyroiditis, thyrotoxic episode of Hashimoto's thyroiditis, or Graves' disease.  At today's visit, we will recheck her TFTs along with TPO, ATA, TSI antibodies.  We may need a thyroid uptake and scan after the results are back if the etiology still not clear.  In the meantime, my suggestion was to start a beta-blocker to help with the palpitations along with anxiety, however, since she is breast-feeding, she would want to hold off starting it.  I did advise her to avoid exercise, reduce stress, but if her heart rate is not improving (she has a smart watch), to let me know, to start a low-dose beta-blocker. -I will be in touch with her after the results come back. -I plan to see her back in 1.5 months.  Orders Placed This Encounter  Procedures   TSH   T4, free   T3, free   Thyroglobulin antibody   Thyroid peroxidase antibody   Thyroid stimulating immunoglobulin   Component     Latest Ref Rng 03/21/2023  TSH     0.35 - 5.50 uIU/mL 0.00 (L)   Thyroglobulin Ab     < or = 1 IU/mL 1   Thyroperoxidase Ab SerPl-aCnc     <  9 IU/mL 556 (H)   Triiodothyronine,Free,Serum     2.3 - 4.2 pg/mL 5.7 (H)   T4,Free(Direct)     0.60 - 1.60 ng/dL 1.61 (H)   TSI     <096 % baseline 316 (H)   TSH is still low, with slightly high free  T4 and free T3.  I did suggest a low-dose beta-blocker, but patient would like to hold off due to breast-feeding.  She would like to avoid medications that can influence the baby. All of her antithyroid antibodies are elevated, including TSIs.  For now, my suspicion is for Graves' disease (but thyrotoxic phase of Hashimoto's thyroiditis is not excluded).  It would be helpful to obtain a thyroid uptake and scan to differentiate between these.  Methimazole would be indicated if she has an increased uptake.  Thyroid uptake and scan (03/21/2023): Following oral administration of I-123 capsule, anterior planar imaging was acquired at 24 hours. Thyroid uptake was calculated with a thyroid probe at 4-6 hours and 24 hours.   RADIOPHARMACEUTICALS:  Three hundred sixty-eight uCi I-123 sodium iodide p.o.   COMPARISON:  None Available.   FINDINGS: Uniform uptake within enlarged thyroid gland. Pyramidal lobe evident.   4 hour I-123 uptake = 8.8% (normal 5-20%) 24 hour I-123 uptake = 17.2% (normal 10-30%)   IMPRESSION: 1. Uniform uptake in enlarged gland. Foraminal pyramidal lobe. No nodularity. 2. Iodine uptake within normal limits.   Electronically Signed   By: Genevive Bi M.D.   On: 04/07/2023 17:46  The thyroid uptake is normal.  There is a visible pyramidal lobe.  These findings point towards Graves' disease, which appears to be improving.  For now, we could follow her clinically and I would like to repeat another set of thyroid tests in 1 month.  I would suggest a beta-blocker but she would like to hold off due to breast-feeding.  Annette Pavlov, MD PhD [facility] End[facility]

## 2023-03-24 ENCOUNTER — Encounter: Payer: Self-pay | Admitting: Internal Medicine

## 2023-03-25 ENCOUNTER — Encounter: Payer: Self-pay | Admitting: Internal Medicine

## 2023-03-25 LAB — THYROID PEROXIDASE ANTIBODY: Thyroperoxidase Ab SerPl-aCnc: 556 [IU]/mL — ABNORMAL HIGH (ref <9)

## 2023-03-25 LAB — THYROGLOBULIN ANTIBODY: Thyroglobulin Ab: 1 [IU]/mL (ref <=1)

## 2023-03-25 LAB — THYROID STIMULATING IMMUNOGLOBULIN: TSI: 316 % baseline — ABNORMAL HIGH (ref <140)

## 2023-03-25 NOTE — Addendum Note (Signed)
Addended by: Carlus Pavlov on: 03/25/2023 04:47 PM   Modules accepted: Orders

## 2023-03-31 ENCOUNTER — Encounter (HOSPITAL_COMMUNITY)
Admission: RE | Admit: 2023-03-31 | Discharge: 2023-03-31 | Disposition: A | Discharge status: Home / Self Care | Payer: 59 | Source: Ambulatory Visit | Attending: Internal Medicine | Admitting: Internal Medicine

## 2023-03-31 DIAGNOSIS — E0580 Other thyrotoxicosis without thyrotoxic crisis or storm: Secondary | ICD-10-CM | POA: Diagnosis not present

## 2023-03-31 MED ORDER — SODIUM IODIDE I-123 7.4 MBQ CAPS
368.3000 | ORAL_CAPSULE | Freq: Once | ORAL | Status: AC
  Administered 2023-03-31: 368.3 via ORAL

## 2023-04-01 ENCOUNTER — Encounter (HOSPITAL_COMMUNITY)
Admission: RE | Admit: 2023-04-01 | Discharge: 2023-04-01 | Disposition: A | Discharge status: Home / Self Care | Payer: 59 | Source: Ambulatory Visit | Attending: Internal Medicine | Admitting: Internal Medicine

## 2023-04-01 DIAGNOSIS — E0590 Thyrotoxicosis, unspecified without thyrotoxic crisis or storm: Secondary | ICD-10-CM | POA: Diagnosis not present

## 2023-04-01 DIAGNOSIS — E049 Nontoxic goiter, unspecified: Secondary | ICD-10-CM | POA: Diagnosis not present

## 2023-04-04 ENCOUNTER — Ambulatory Visit: Payer: 59 | Admitting: Internal Medicine

## 2023-04-08 ENCOUNTER — Ambulatory Visit (INDEPENDENT_AMBULATORY_CARE_PROVIDER_SITE_OTHER): Payer: 59 | Admitting: Orthopedic Surgery

## 2023-04-08 DIAGNOSIS — M654 Radial styloid tenosynovitis [de Quervain]: Secondary | ICD-10-CM | POA: Diagnosis not present

## 2023-04-08 NOTE — Progress Notes (Signed)
Annette Parks - 32 y.o. female MRN [id number]  Date of birth: 09-23-1990  Office Visit Note: Visit Date: 04/08/2023 PCP: Pearline Cables, MD Referred by: Pearline Cables, MD  Subjective: No chief complaint on file.  HPI: Annette Parks is a pleasant 32 y.o. female who presents today for ongoing left radial sided wrist pain consistent with de Quervain's tenosynovitis.  She recently had a child in June of this year, has developed radial sided wrist pain since that time radiating up the thumb and into the left radial forearm.  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  She works as a Engineer, civil (consulting).  At her most recent visit in September of this year, she underwent cortisone injection to the left wrist for extensor compartment.  She did experience a difficult relief from the injection, however over the past 2 to 3 weeks, her symptoms have recurred.  Her pain is quite significant at this juncture.  Pertinent ROS were reviewed with the patient and found to be negative unless otherwise specified above in HPI.   Visit Reason:left wrist [name] Hand dominance: left Occupation: Nursing Diabetic: no Heart/Lung History: none Blood Thinners: none  Prior Testing/EMG: none Injections (Date): September 2024 Treatments: brace, ibuprofen, injection Prior Surgery: none   Assessment & Plan: Visit Diagnoses:  No diagnosis found.   Plan: Extensive discussion was had the patient today regarding her left radial sided wrist pain.  She continues to demonstrate signs and symptoms consistent with left wrist de Quervain's tenosynovitis that is remaining refractory to conservative care.  We once again discussed treatment modalities ranging from conservative to surgical.  From a conservative standpoint, we discussed ongoing bracing, repeat cortisone injection.  From a surgical standpoint, we discussed the  possibility for first extensor compartment release.  Given the severity of her symptoms currently, she is interested in moving forward with left wrist first extensor compartment release which I am in agreement with.  We will move forward with surgical scheduling.  Surgery will be performed under IV sedation.  Risks and benefits of the procedure were discussed, risks including but not limited to infection, bleeding, scarring, stiffness, nerve injury, tendon injury, vascular injury, tendon instability, recurrence of symptoms and need for subsequent operation.  Patient expressed understanding.      Follow-up: No follow-ups on file.   Meds & Orders: No orders of the defined types were placed in this encounter.   No orders of the defined types were placed in this encounter.    Procedures: No procedures performed      Clinical History: No specialty comments available.  She reports that she has never smoked. She has never used smokeless tobacco.  Recent Labs    01/13/23 1507  HGBA1C 5.5    Objective:   Vital Signs: LMP 12/17/2021 (Approximate) Comment: approx August 2023  Physical Exam  Gen: Well-appearing, in no acute distress; non-toxic CV: Regular Rate. Well-perfused. Warm.  Resp: Breathing unlabored on room air; no wheezing. Psych: Fluid speech in conversation; appropriate affect; normal thought process  Ortho Exam Left wrist: - Significant tenderness over the radial styloid, positive Finkelstein - No significant instability of the extensor compartments with wrist circumduction - Sensation intact in all distributions, hand is warm well-perfused.  Imaging: No results found.  Past Medical/Family/Surgical/Social History: Medications & Allergies reviewed per EMR, new medications updated. Patient Active Problem List   Diagnosis Date Noted   Pregnancy  10/25/2022   Palpitations 01/18/2020   Closed fracture of fifth metacarpal bone of right hand 11/05/2019   Pain of right  shoulder joint on movement 10/26/2019   Acute pain of right wrist 10/26/2019   Hashimoto's thyroiditis 09/04/2018   Hair loss 09/04/2018   Conductive hearing loss of right ear with unrestricted hearing of left ear 09/26/2017   Perforation of right tympanic membrane 09/26/2017   Hip pain, chronic, left 08/13/2017   Lower back pain 08/11/2017   Trochanteric bursitis of left hip 07/31/2017   Sacroiliac (ligament) sprain 07/31/2017   Common migraine 05/07/2016   Eye pain, bilateral 05/07/2016   Facial pain 05/07/2016   GAD (generalized anxiety disorder) 03/22/2015   Neck pain 07/11/2014   Myalgia 07/11/2014   Dysesthesia 07/11/2014   Past Medical History:  Diagnosis Date   Anal fissure    Hashimoto's disease    IBS (irritable bowel syndrome)    Migraine headache    Pelvic floor dysfunction?    Perforated eardrum    PONV (postoperative nausea and vomiting)    Pregnancy induced hypertension    UTI (urinary tract infection)    Vaginal Pap smear, abnormal    Family History  Problem Relation Age of Onset   Migraines Mother    Hashimoto's thyroiditis Mother    Endometriosis Mother    Hyperlipidemia Father    Diverticulitis Father        had to have part of colon removed   Asthma Brother    Diabetes Maternal Grandmother    Past Surgical History:  Procedure Laterality Date   DILATION AND EVACUATION  09/06/2021   EAR TUBE REMOVAL     TONSILLECTOMY AND ADENOIDECTOMY     TYMPANOPLASTY Right    TYMPANOSTOMY TUBE PLACEMENT     x 3   WISDOM TOOTH EXTRACTION     Social History   Occupational History   Occupation: Designer, jewellery  Tobacco Use   Smoking status: Never   Smokeless tobacco: Never  Vaping Use   Vaping status: Never Used  Substance and Sexual Activity   Alcohol use: Not Currently    Comment: occasional-2 per month   Drug use: No   Sexual activity: Not Currently    [name] Fara Boros) Denese Killings, M.D. [facility]

## 2023-04-09 NOTE — Addendum Note (Signed)
Addended by: Carlus Pavlov on: 04/09/2023 01:10 PM   Modules accepted: Orders

## 2023-04-10 ENCOUNTER — Other Ambulatory Visit: Payer: Self-pay | Admitting: Orthopedic Surgery

## 2023-04-10 ENCOUNTER — Telehealth: Payer: Self-pay

## 2023-04-10 ENCOUNTER — Other Ambulatory Visit (HOSPITAL_COMMUNITY): Payer: Self-pay

## 2023-04-10 ENCOUNTER — Telehealth: Payer: Self-pay | Admitting: Orthopedic Surgery

## 2023-04-10 DIAGNOSIS — M654 Radial styloid tenosynovitis [de Quervain]: Secondary | ICD-10-CM

## 2023-04-10 MED ORDER — OXYCODONE HCL 5 MG PO TABS
5.0000 mg | ORAL_TABLET | Freq: Four times a day (QID) | ORAL | 0 refills | Status: DC | PRN
  Filled 2023-04-10: qty 15, 4d supply, fill #0

## 2023-04-10 NOTE — Telephone Encounter (Signed)
Patients mom called stating that patient has some questions to ask and would like a call back at [phone].  Please advise.  Thank you.

## 2023-04-10 NOTE — Telephone Encounter (Signed)
Patient called asked if she need to be out of work for 2 weeks. I also spoke with Patient's mother Charlton Amor and she advised patient drives 30 minutes to work each day and don't know if she can do that. Patient said she was told not to use her hand for 2 weeks. 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 The number to contact patient is [phone]

## 2023-04-11 ENCOUNTER — Telehealth: Payer: Self-pay | Admitting: Orthopedic Surgery

## 2023-04-11 ENCOUNTER — Encounter: Payer: Self-pay | Admitting: Orthopedic Surgery

## 2023-04-11 ENCOUNTER — Other Ambulatory Visit: Payer: Self-pay | Admitting: Orthopedic Surgery

## 2023-04-11 DIAGNOSIS — M654 Radial styloid tenosynovitis [de Quervain]: Secondary | ICD-10-CM

## 2023-04-11 NOTE — Telephone Encounter (Signed)
Called patient left message to return call. Per note from [place] a note was sent via mychart and patient is to remain out of work until her 2 week post op visit. Will advised patient is this when she return call.

## 2023-04-11 NOTE — Telephone Encounter (Signed)
Patient called back and I advised her of the note and that she is to remain out of work until her 2 week post op visit. Patient asked if her thump should still be numb?    The number to contact patient is [phone]

## 2023-04-11 NOTE — Telephone Encounter (Signed)
[employer] forms received. To [employer]. 

## 2023-04-11 NOTE — Telephone Encounter (Signed)
Patient says her thumb is numb. Would like a call back. [phone]

## 2023-04-14 ENCOUNTER — Telehealth: Payer: Self-pay | Admitting: Orthopedic Surgery

## 2023-04-14 NOTE — Telephone Encounter (Signed)
LVM for patient. Informing her that this can be normal and will subside.

## 2023-04-14 NOTE — Telephone Encounter (Signed)
Received [employer] forms, auth & $25 cash. To [employer].

## 2023-04-14 NOTE — Telephone Encounter (Signed)
Pt came in to do forms and wanted Dr Fara Boros and Lequita Halt H to know her hand on her surgical hand is burning alittle bit and would like a call back. Please call pt at [phone].

## 2023-04-14 NOTE — Telephone Encounter (Signed)
Spoke to patient and reassured her that all of this was normal.

## 2023-04-16 ENCOUNTER — Telehealth: Payer: Self-pay

## 2023-04-16 ENCOUNTER — Ambulatory Visit
Admission: RE | Admit: 2023-04-16 | Discharge: 2023-04-16 | Disposition: A | Discharge status: Home / Self Care | Payer: 59 | Source: Ambulatory Visit | Attending: Family Medicine | Admitting: Family Medicine

## 2023-04-16 ENCOUNTER — Other Ambulatory Visit: Payer: Self-pay

## 2023-04-16 VITALS — BP 124/87 | HR 99 | Temp 97.7°F | Resp 16

## 2023-04-16 DIAGNOSIS — J029 Acute pharyngitis, unspecified: Secondary | ICD-10-CM | POA: Diagnosis not present

## 2023-04-16 LAB — POCT RAPID STREP A (OFFICE): Rapid Strep A Screen: NEGATIVE

## 2023-04-16 MED ORDER — PREDNISONE 50 MG PO TABS: ORAL_TABLET | ORAL | 0 refills | Status: DC

## 2023-04-16 MED ORDER — AMOXICILLIN-POT CLAVULANATE 875-125 MG PO TABS: 1.0000 | ORAL_TABLET | Freq: Two times a day (BID) | ORAL | 0 refills | Status: DC

## 2023-04-16 NOTE — Discharge Instructions (Addendum)
Advised patient to take medications as directed with food to completion.  Advised patient to take prednisone with first dose of Augmentin for the next 5 of 7 days.  Instructed patient to hold her breast-feeding for 4 hours once taking the prednisone dose.  Encouraged to increase daily water intake to 64 ounces per day while taking these medications.  Advised we will follow-up with throat culture results once received.

## 2023-04-16 NOTE — ED Provider Notes (Signed)
[facility]    CSN: [id number] Arrival date & time: 04/16/23  0846      History   Chief Complaint Chief Complaint  Patient presents with   Sore Throat    HPI Annette Parks is a 32 y.o. female.   HPI 32 year old female presents with sore throat and drainage for 2 days.  Reports is currently breast-feeding.  PMH significant for migraine headache, IBS, and PONV.  Past Medical History:  Diagnosis Date   Anal fissure    Hashimoto's disease    IBS (irritable bowel syndrome)    Migraine headache    Pelvic floor dysfunction?    Perforated eardrum    PONV (postoperative nausea and vomiting)    Pregnancy induced hypertension    UTI (urinary tract infection)    Vaginal Pap smear, abnormal     Patient Active Problem List   Diagnosis Date Noted   Pregnancy 10/25/2022   Palpitations 01/18/2020   Closed fracture of fifth metacarpal bone of right hand 11/05/2019   Pain of right shoulder joint on movement 10/26/2019   Acute pain of right wrist 10/26/2019   Hashimoto's thyroiditis 09/04/2018   Hair loss 09/04/2018   Conductive hearing loss of right ear with unrestricted hearing of left ear 09/26/2017   Perforation of right tympanic membrane 09/26/2017   Hip pain, chronic, left 08/13/2017   Lower back pain 08/11/2017   Trochanteric bursitis of left hip 07/31/2017   Sacroiliac (ligament) sprain 07/31/2017   Common migraine 05/07/2016   Eye pain, bilateral 05/07/2016   Facial pain 05/07/2016   GAD (generalized anxiety disorder) 03/22/2015   Neck pain 07/11/2014   Myalgia 07/11/2014   Dysesthesia 07/11/2014    Past Surgical History:  Procedure Laterality Date   DILATION AND EVACUATION  09/06/2021   EAR TUBE REMOVAL     TONSILLECTOMY AND ADENOIDECTOMY     TYMPANOPLASTY Right    TYMPANOSTOMY TUBE PLACEMENT     x 3   WISDOM TOOTH EXTRACTION      OB History     Gravida  3   Para  1   Term  1   Preterm      AB  2   Living  1      SAB  2   IAB       Ectopic      Multiple  0   Live Births  1            Home Medications    Prior to Admission medications   Medication Sig Start Date End Date Taking? Authorizing Provider  amoxicillin-clavulanate (AUGMENTIN) 875-125 MG tablet Take 1 tablet by mouth every 12 (twelve) hours. 04/16/23  Yes Trevor Iha, FNP  predniSONE (DELTASONE) 50 MG tablet Take 1 tab p.o. daily for 5 days. 04/16/23  Yes Trevor Iha, FNP  Prenatal Vit-Fe Fumarate-FA (PRENATAL VITAMIN PO)     [provider]  VITAMIN D PO     [provider]    Family History Family History  Problem Relation Age of Onset   Migraines Mother    Hashimoto's thyroiditis Mother    Endometriosis Mother    Hyperlipidemia Father    Diverticulitis Father        had to have part of colon removed   Asthma Brother    Diabetes Maternal Grandmother     Social History Social History   Tobacco Use   Smoking status: Never   Smokeless tobacco: Never  Vaping Use   Vaping  status: Never Used  Substance Use Topics   Alcohol use: Not Currently    Comment: occasional-2 per month   Drug use: No     Allergies   Patient has no known allergies.   Review of Systems Review of Systems  HENT:  Positive for sore throat.   All other systems reviewed and are negative.    Physical Exam Triage Vital Signs ED Triage Vitals  Encounter Vitals Group     BP 04/16/23 0902 124/87     Systolic BP Percentile --      Diastolic BP Percentile --      Pulse Rate 04/16/23 0902 99     Resp 04/16/23 0902 16     Temp 04/16/23 0902 97.7 F (36.5 C)     Temp Source 04/16/23 0902 Oral     SpO2 04/16/23 0902 98 %     Weight --      Height --      Head Circumference --      Peak Flow --      Pain Score 04/16/23 0905 6     Pain Loc --      Pain Education --      Exclude from Growth Chart --    No data found.  Updated Vital Signs BP 124/87   Pulse 99   Temp 97.7 F (36.5 C) (Oral)   Resp 16   LMP 12/17/2021  (Approximate) Comment: approx August 2023  SpO2 98%   Visual Acuity Right Eye Distance:   Left Eye Distance:   Bilateral Distance:    Right Eye Near:   Left Eye Near:    Bilateral Near:     Physical Exam Vitals and nursing note reviewed.  Constitutional:      Appearance: She is well-developed and normal weight.  HENT:     Head: Normocephalic and atraumatic.     Right Ear: Tympanic membrane and ear canal normal.     Left Ear: Tympanic membrane and ear canal normal.     Mouth/Throat:     Mouth: Mucous membranes are moist.     Pharynx: Oropharynx is clear. Uvula midline.     Tonsils: 1+ on the right. 1+ on the left.  Cardiovascular:     Heart sounds: Normal heart sounds.  Pulmonary:     Breath sounds: No wheezing, rhonchi or rales.  Skin:    General: Skin is warm and dry.  Neurological:     General: No focal deficit present.     Mental Status: She is alert and oriented to person, place, and time.  Psychiatric:        Mood and Affect: Mood normal.        Behavior: Behavior normal.      UC Treatments / Results  Labs (all labs ordered are listed, but only abnormal results are displayed) Labs Reviewed  CULTURE, GROUP A STREP [facility])  POCT RAPID STREP A (OFFICE)    EKG   Radiology No results found.  Procedures Procedures (including critical care time)  Medications Ordered in UC Medications - No data to display  Initial Impression / Assessment and Plan / UC Course  I have reviewed the triage vital signs and the nursing notes.  Pertinent labs & imaging results that were available during my care of the patient were reviewed by me and considered in my medical decision making (see chart for details).     MDM: 1.  Acute pharyngitis, unspecified etiology-Rx Augmentin 875/125 mg tablet: Take 1  tablet twice daily x 7 days, Rx'd prednisone 50 mg tablet: Take 1 tablet daily x 5 days. Advised patient to take medications as directed with food to completion.  Advised  patient to take prednisone with first dose of Augmentin for the next 5 of 7 days.  Instructed patient to hold her breast-feeding for 4 hours once taking the prednisone dose.  Encouraged to increase daily water intake to 64 ounces per day while taking these medications.  Advised we will follow-up with throat culture results once received. Final Clinical Impressions(s) / UC Diagnoses   Final diagnoses:  Acute pharyngitis, unspecified etiology     Discharge Instructions      Advised patient to take medications as directed with food to completion.  Advised patient to take prednisone with first dose of Augmentin for the next 5 of 7 days.  Instructed patient to hold her breast-feeding for 4 hours once taking the prednisone dose.  Encouraged to increase daily water intake to 64 ounces per day while taking these medications.  Advised we will follow-up with throat culture results once received.     ED Prescriptions     Medication Sig Dispense Auth. Provider   amoxicillin-clavulanate (AUGMENTIN) 875-125 MG tablet Take 1 tablet by mouth every 12 (twelve) hours. 14 tablet Trevor Iha, FNP   predniSONE (DELTASONE) 50 MG tablet Take 1 tab p.o. daily for 5 days. 5 tablet Trevor Iha, FNP      PDMP not reviewed this encounter.   Trevor Iha, FNP 04/16/23 1017

## 2023-04-16 NOTE — Telephone Encounter (Signed)
Spoke with patient. Informed her that the medication was ok to take.

## 2023-04-16 NOTE — Telephone Encounter (Signed)
Patient called wanting to know if it is okay if she takes Prednisone and amoxicillin being that she had wrist surgery.  CB# [phone].  Please advise.  Thank you.

## 2023-04-16 NOTE — ED Triage Notes (Signed)
Woke up yesterday morning with a sore throat, had throat drainage the day before. Hard to swallow and hard to talk. Is breastfeeding. Has been taking mucinex, tylenol, ibuprofen, cough drops, gargling with salt water.

## 2023-04-18 LAB — CULTURE, GROUP A STREP (THRC)

## 2023-04-18 NOTE — Therapy (Signed)
OUTPATIENT OCCUPATIONAL THERAPY ORTHO EVALUATION  Patient Name: Annette Parks MRN: [id number] DOB:05-29-1990, 32 y.o., female Today's Date: 04/28/2023  PCP: Abbe Amsterdam, MD REFERRING PROVIDER: Samuella Cota, MD   END OF SESSION:  OT End of Session - 04/28/23 1523     Visit Number 1    Number of Visits 12    Date for OT Re-Evaluation 06/13/23    Authorization Type [facility]    OT Start Time 1523    OT Stop Time 1643    OT Time Calculation (min) 80 min    Equipment Utilized During Treatment orthotic materials    Activity Tolerance Patient tolerated treatment well;No increased pain;Patient limited by fatigue;Patient limited by lethargy;Patient limited by pain    Behavior During Therapy [facility] for tasks assessed/performed;Anxious             Past Medical History:  Diagnosis Date   Anal fissure    Hashimoto's disease    IBS (irritable bowel syndrome)    Migraine headache    Pelvic floor dysfunction?    Perforated eardrum    PONV (postoperative nausea and vomiting)    Pregnancy induced hypertension    UTI (urinary tract infection)    Vaginal Pap smear, abnormal    Past Surgical History:  Procedure Laterality Date   DILATION AND EVACUATION  09/06/2021   EAR TUBE REMOVAL     TONSILLECTOMY AND ADENOIDECTOMY     TYMPANOPLASTY Right    TYMPANOSTOMY TUBE PLACEMENT     x 3   WISDOM TOOTH EXTRACTION     Patient Active Problem List   Diagnosis Date Noted   Pregnancy 10/25/2022   Palpitations 01/18/2020   Closed fracture of fifth metacarpal bone of right hand 11/05/2019   Pain of right shoulder joint on movement 10/26/2019   Acute pain of right wrist 10/26/2019   Hashimoto's thyroiditis 09/04/2018   Hair loss 09/04/2018   Conductive hearing loss of right ear with unrestricted hearing of left ear 09/26/2017   Perforation of right tympanic membrane 09/26/2017   Hip pain, chronic, left 08/13/2017   Lower back pain 08/11/2017   Trochanteric bursitis of left hip  07/31/2017   Sacroiliac (ligament) sprain 07/31/2017   Common migraine 05/07/2016   Eye pain, bilateral 05/07/2016   Facial pain 05/07/2016   GAD (generalized anxiety disorder) 03/22/2015   Neck pain 07/11/2014   Myalgia 07/11/2014   Dysesthesia 07/11/2014    ONSET DATE: DOS 04/10/23  REFERRING DIAG: M65.4 (ICD-10-CM) - Tendinitis, de Quervain's   THERAPY DIAG:  Stiffness of left hand, not elsewhere classified - Plan: Ot plan of care cert/re-cert  Localized edema - Plan: Ot plan of care cert/re-cert  Muscle weakness (generalized) - Plan: Ot plan of care cert/re-cert  Other lack of coordination - Plan: Ot plan of care cert/re-cert  Pain in left hand - Plan: Ot plan of care cert/re-cert  Pain in left wrist - Plan: Ot plan of care cert/re-cert  Stiffness of left wrist, not elsewhere classified - Plan: Ot plan of care cert/re-cert  Rationale for Evaluation and Treatment: Rehabilitation  SUBJECTIVE:   SUBJECTIVE STATEMENT: 2 weeks and 4 days post DQ release with compartment repair and DSRN lysis, as well as APL/EPB tenolysis.  She states that she is a bit sore.  She states also being a little anxious and was told that she should not be moving yet in her words.  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  She is married and has a 43-month-old  child from home she developed "mommy thumb," leading to tenosynovitis as well as this recent surgery.  Apparently, her capsule was a bit weak and needed a suture for repair after this typical surgery-she also needed some tenolysis due to adherent tendons.  Today she appears stiffly guarded at the shoulder, elbow, forearm and hand-arriving in a bulky dressing.   PERTINENT HISTORY: Per referral note:"S/p [facility]'quervains; SPLINT and OT 04/25/23 if possible after her PO with [place]"   PRECAUTIONS: None  RED FLAGS: None   WEIGHT BEARING RESTRICTIONS: Yes nonweightbearing in the left thumb/hand/arm for at least the next month  PAIN:  Are you  having pain? Yes: NPRS scale: 1-2/10 up to 6/10 in past week  Pain location: Lt wrist/thumb Pain description: aching, throbbing  Aggravating factors: Attempted motion or gripping Relieving factors: Rest  FALLS: Has patient fallen in last 6 months? No  LIVING ENVIRONMENT: Lives with: lives with their spouse and lives with their daughter Lives in: House/apartment Has following equipment at home: None  PLOF: Independent  PATIENT GOALS: To improve the use of her dominant left hand and arm    OBJECTIVE: (All objective assessments below are from initial evaluation on: 04/28/2023 unless otherwise specified.)   HAND DOMINANCE: Left  ADLs: Overall ADLs: States decreased ability to grab, hold household objects, pain and difficulty to open containers, perform FMS tasks (manipulate fasteners on clothing), mild to moderate bathing problems as well.    FUNCTIONAL OUTCOME MEASURES: Eval: Quick DASH 66% impairment today  (Higher % Score  =  More Impairment)     UPPER EXTREMITY ROM     Shoulder to Wrist AROM Left eval  Elbow flexion 146  Elbow extension (-15)  Forearm supination 78  Forearm pronation  22  Wrist flexion 38  Wrist extension (-11)  (Blank rows = not tested)   Hand AROM Left eval  Full Fist Ability (or Gap to Distal Palmar Crease) 3cm gap from tip of MF to [facility]  Thumb Opposition  (Kapandji Scale)  0/10  Thumb MCP (0-60)   Thumb IP (0-80)   Thumb Radial Abduction Span    Thumb Palmar Abduction Span    (Blank rows = not tested)   UPPER EXTREMITY MMT:    Eval:  NT at eval due to recent and still healing injuries. Will be tested when appropriate.   MMT Left TBD  Shoulder flexion   Shoulder abduction   Shoulder adduction   Shoulder extension   Shoulder internal rotation   Shoulder external rotation   Middle trapezius   Lower trapezius   Elbow flexion   Elbow extension   Forearm supination   Forearm pronation   Wrist flexion   Wrist extension   Wrist  ulnar deviation   Wrist radial deviation   (Blank rows = not tested)  HAND FUNCTION: Eval: Observed weakness in affected Lt hand.  Details will be tested when safe Grip strength Right: 58 lbs, Left: NT lbs   COORDINATION: Eval: Observed coordination impairments with affected Lt hand.  Details will be tested when appropriate 9 Hole Peg Test Left: TBD sec (TBD sec is WFL)   SENSATION: Eval: LT diminished around sx area and radial nerve distribution to thumb   EDEMA:   Eval:  Mildly swollen in Lt hand and wrist today  COGNITION: Eval: Overall cognitive status: WFL for evaluation today though overtly anxious   OBSERVATIONS:   Eval: She is not overly hypersensitive but she is guarded from pain and holding her arm stiffly by  her side.  She is also holding her fingers stiffly as well as her thumb and has some palpable spasms in her left bicep as well as some shoulder stiffness.  She also shakes seemingly nervously or uncontrollably when out of her bulky dressing-though this stops when her eyes are closed   TODAY'S TREATMENT:  Post-evaluation treatment:   Custom orthotic fabrication was indicated due to pt's healing left thumb and wrist surgery and need for safe, functional positioning. OT fabricated custom forearm-based thumb spica orthosis for pt today to protect her wrist and thumb. It fit well with no areas of pressure, pt states a comfortable fit. Pt was educated on the wearing schedule (on at all times except for hygiene and exercises), to avoid exposing it to sources of heat, to wipe clean as needed (do not wash, use harsh detergents), to call or come in ASAP if it is causing any irritation or is not achieving desired function. It will be checked/adjusted in upcoming sessions, as needed. Pt states understanding all directions.     OT advises her for safety/self-care to avoid soaking her wound for the next week, but she should keep it clean and wash it and keep it lightly moisturized.   She should avoid hanging her arm downward, but should also avoid putting her shoulder and elbow at a flexed and stiff position constantly.  She should perform manual scar massages for herself at least 4 times a day also for desensitization of the surgical site.  She was shown how to do this safely.  She was also given the following home exercise program of active range of motion to tolerable tension points to perform 4-6 times a day.  She will take off her orthosis to do this.  These were explained to her, demonstrated and she performs them back with generally no pain:  Exercises - Seated Scapular Retraction  - 4-6 x daily - 5 reps - 5 sec hold - Reach arms upward   - 4-6 x daily - 10 reps - Turn [employer] Facing Up & Down  - 4-6 x daily - 10-15 reps - Bend and Pull Back Wrist SLOWLY  - 4 x daily - 10-15 reps - "Windshield Wipers"   - 4 x daily - 10-15 reps - Tendon Glides  - 4-6 x daily - 3-5 reps - 2-3 seconds hold - Seated Thumb Circumduction AROM  - 4-6 x daily - 10-15 reps - Thumb Opposition  - 4-6 x daily - 10 reps    PATIENT EDUCATION: Education details: See tx section above for details  Person educated: Patient Education method: Verbal Instruction, Teach back, Handouts  Education comprehension: States and demonstrates understanding, Additional Education required    HOME EXERCISE PROGRAM: Access Code: [email or web address] URL: [email or web address] Date: 04/28/2023 Prepared by: [name]   GOALS: Goals reviewed with patient? Yes   SHORT TERM GOALS: (STG required if POC>30 days) Target Date: 05/16/2023  Pt will obtain protective, custom orthotic. Goal status: 04/28/2023 MET though this may need adjusted in upcoming sessions  2.  Pt will demo/state understanding of initial HEP to improve pain levels and prerequisite motion. Goal status: INITIAL   LONG TERM GOALS: Target Date: 06/13/2023  Pt will improve functional ability by decreased impairment per Quick DASH  assessment from 66% to 15% or better, for better quality of life. Goal status: INITIAL  2.  Pt will improve grip strength in left hand from NTlbs to at least 40lbs for functional use at home and in IADLs. Goal  status: INITIAL  3.  Pt will improve A/ROM in left wrist flexion/extension from 38/(-11) to at least 55 degrees each, to have functional motion for tasks like reach and grasp.  Goal status: INITIAL  4.  Pt will improve strength in left wrist flexion/extension from NT MMT to at least 4/5 MMT to have increased functional ability to carry out selfcare and higher-level homecare tasks with less difficulty. Goal status: INITIAL  5.  Pt will improve coordination skills in left hand, as seen by within functional limit score on nine-hole peg testing to have increased functional ability to carry out fine motor tasks (fasteners, etc.) and more complex, coordinated IADLs (meal prep, sports, etc.).  Goal status: INITIAL  6.  Pt will decrease pain at worst from 6/10 to 3/10 or better to have better sleep and occupational participation in daily roles. Goal status: INITIAL   ASSESSMENT:  CLINICAL IMPRESSION: Patient is a 32y.o. female who was seen today for occupational therapy evaluation for left dominant thumb/hand/wrist pain, stiffness, weakness, decreased functional ability due to tendinitis and subsequent surgery.  She will benefit from outpatient occupational therapy to increase her ability to safely, to increase occupational performance.   PERFORMANCE DEFICITS: in functional skills including ADLs, IADLs, coordination, dexterity, proprioception, sensation, edema, ROM, strength, pain, fascial restrictions, muscle spasms, flexibility, Fine motor control, Gross motor control, body mechanics, endurance, decreased knowledge of precautions, wound, and UE functional use, cognitive skills including emotional, problem solving, and safety awareness, and psychosocial skills including coping strategies,  environmental adaptation, and habits.   IMPAIRMENTS: are limiting patient from ADLs, IADLs, work, leisure, and social participation.   COMORBIDITIES: may have co-morbidities  that affects occupational performance. Patient will benefit from skilled OT to address above impairments and improve overall function.  MODIFICATION OR ASSISTANCE TO COMPLETE EVALUATION: No modification of tasks or assist necessary to complete an evaluation.  OT OCCUPATIONAL PROFILE AND HISTORY: Detailed assessment: Review of records and additional review of physical, cognitive, psychosocial history related to current functional performance.  CLINICAL DECISION MAKING: Moderate - several treatment options, min-mod task modification necessary  REHAB POTENTIAL: Excellent  EVALUATION COMPLEXITY: Low      PLAN:  OT FREQUENCY: 1-2x/week  OT DURATION: 6 weeks through 06/13/2023 and up to 12 total visits as needed  PLANNED INTERVENTIONS: 97168 OT Re-evaluation, 97535 self care/ADL training, 81191 therapeutic exercise, 97530 therapeutic activity, 97112 neuromuscular re-education, 97140 manual therapy, 97035 ultrasound, 97018 paraffin, 47829 fluidotherapy, 97010 moist heat, 97010 cryotherapy, 97034 contrast bath, 97760 Orthotics management and training, 56213 Splinting (initial encounter), [id number] Subsequent splinting/medication, scar mobilization, passive range of motion, compression bandaging, Dry needling, coping strategies training, patient/family education, and DME and/or AE instructions  RECOMMENDED OTHER SERVICES: None now  CONSULTED AND AGREED WITH PLAN OF CARE: Patient  PLAN FOR NEXT SESSION:   Check orthosis as needed, check initial home exercise program, check range of motion and take nine-hole peg test initial measure   [name], OTR/L, CHT 04/28/2023, 5:29 PM

## 2023-04-22 ENCOUNTER — Encounter: Payer: Self-pay | Admitting: Orthopedic Surgery

## 2023-04-24 ENCOUNTER — Ambulatory Visit (INDEPENDENT_AMBULATORY_CARE_PROVIDER_SITE_OTHER): Payer: 59 | Admitting: Orthopedic Surgery

## 2023-04-24 DIAGNOSIS — M654 Radial styloid tenosynovitis [de Quervain]: Secondary | ICD-10-CM

## 2023-04-24 NOTE — Progress Notes (Signed)
   Annette Parks - 32 y.o. female MRN [id number]  Date of birth: 1991/02/17  Office Visit Note: Visit Date: 04/24/2023 PCP: Pearline Cables, MD Referred by: Pearline Cables, MD  Subjective:  HPI: Annette Parks is a 32 y.o. female who presents today for follow up 2 weeks status post left wrist first extensor compartment release with subsequent repair due to ongoing tendon instability, also with dorsal radial sensory nerve neurolysis.  She is doing well overall, does have some ongoing hypersensitivity and pain at the incisional site.  She is taking over-the-counter medication for pain, no narcotics.  Pertinent ROS were reviewed with the patient and found to be negative unless otherwise specified above in HPI.   Assessment & Plan: Visit Diagnoses: No diagnosis found.  Plan: She is demonstrated appropriate healing on her postoperative visit today.  Suture tails were trimmed.  She is transition to a thumb spica prefabricated splint, will be seen by occupational therapy on Monday next week for fabrication of a thumb spica orthosis and begin gentle range of motion exercise.  Given that we had to do a subsequent repair of the first sensor compartment due to ongoing instability, I recommended that she remain with no use of the left hand or wrist for an additional 2 weeks.  We will keep her out of work for that time frame as well.  Follow-up in 2 weeks with myself for recheck, at that juncture we can likely begin to move her to activities as tolerated.  From a therapy standpoint, I recommended that she do gentle range of motion exercises per OT for the wrist and thumb, refrain from any weightbearing or strengthening at this time.  Splint at all times when not doing exercises or hygiene.  Follow-up: No follow-ups on file.   Meds & Orders: No orders of the defined types were placed in this encounter.  No orders of the defined types were placed in this encounter.    Procedures: No procedures performed        Objective:   Vital Signs: LMP 12/17/2021 (Approximate) Comment: approx August 2023  Ortho Exam Left wrist: - Well-healing incisional site at the glabrous/non glabrous border dorsal radial wrist, suture tails are in place, skin edges are well-approximated with Dermabond in place, no erythema or drainage - Gentle thumb circumduction without significant pain - Notable ongoing swelling around the dorsal radial aspect of the wrist and digits  Imaging: No results found.   Hilario Robarts Trevor Mace, M.D. [facility] 8:42 AM

## 2023-04-25 ENCOUNTER — Encounter: Payer: 59 | Admitting: Orthopedic Surgery

## 2023-04-28 ENCOUNTER — Encounter: Payer: Self-pay | Admitting: Rehabilitative and Restorative Service Providers"

## 2023-04-28 ENCOUNTER — Ambulatory Visit (INDEPENDENT_AMBULATORY_CARE_PROVIDER_SITE_OTHER): Payer: 59 | Admitting: Rehabilitative and Restorative Service Providers"

## 2023-04-28 DIAGNOSIS — R600 Localized edema: Secondary | ICD-10-CM

## 2023-04-28 DIAGNOSIS — M25642 Stiffness of left hand, not elsewhere classified: Secondary | ICD-10-CM | POA: Diagnosis not present

## 2023-04-28 DIAGNOSIS — R278 Other lack of coordination: Secondary | ICD-10-CM | POA: Diagnosis not present

## 2023-04-28 DIAGNOSIS — M6281 Muscle weakness (generalized): Secondary | ICD-10-CM | POA: Diagnosis not present

## 2023-04-28 DIAGNOSIS — M25532 Pain in left wrist: Secondary | ICD-10-CM

## 2023-04-28 DIAGNOSIS — M25632 Stiffness of left wrist, not elsewhere classified: Secondary | ICD-10-CM

## 2023-04-28 DIAGNOSIS — M79642 Pain in left hand: Secondary | ICD-10-CM

## 2023-04-29 NOTE — Therapy (Signed)
 OUTPATIENT OCCUPATIONAL THERAPY TREATMENT NOTE  Patient Name: Annette Parks MRN: [id number] DOB:07/26/1990, 32 y.o., female Today's Date: 05/01/2023  PCP: Watt Raisin, MD REFERRING PROVIDER: Arlinda Buster, MD   END OF SESSION:  OT End of Session - 05/01/23 1430     Visit Number 2    Number of Visits 12    Date for OT Re-Evaluation 06/13/23    Authorization Type [employer]    OT Start Time 1430    OT Stop Time 1519    OT Time Calculation (min) 49 min    Equipment Utilized During Treatment --    Activity Tolerance Patient tolerated treatment well;No increased pain;Patient limited by fatigue;Patient limited by lethargy;Patient limited by pain    Behavior During Therapy [facility] for tasks assessed/performed;Anxious              Past Medical History:  Diagnosis Date   Anal fissure    Hashimoto's disease    IBS (irritable bowel syndrome)    Migraine headache    Pelvic floor dysfunction?    Perforated eardrum    PONV (postoperative nausea and vomiting)    Pregnancy induced hypertension    UTI (urinary tract infection)    Vaginal Pap smear, abnormal    Past Surgical History:  Procedure Laterality Date   DILATION AND EVACUATION  09/06/2021   EAR TUBE REMOVAL     TONSILLECTOMY AND ADENOIDECTOMY     TYMPANOPLASTY Right    TYMPANOSTOMY TUBE PLACEMENT     x 3   WISDOM TOOTH EXTRACTION     Patient Active Problem List   Diagnosis Date Noted   Pregnancy 10/25/2022   Palpitations 01/18/2020   Closed fracture of fifth metacarpal bone of right hand 11/05/2019   Pain of right shoulder joint on movement 10/26/2019   Acute pain of right wrist 10/26/2019   Hashimoto's thyroiditis 09/04/2018   Hair loss 09/04/2018   Conductive hearing loss of right ear with unrestricted hearing of left ear 09/26/2017   Perforation of right tympanic membrane 09/26/2017   Hip pain, chronic, left 08/13/2017   Lower back pain 08/11/2017   Trochanteric bursitis of left hip 07/31/2017    Sacroiliac (ligament) sprain 07/31/2017   Common migraine 05/07/2016   Eye pain, bilateral 05/07/2016   Facial pain 05/07/2016   GAD (generalized anxiety disorder) 03/22/2015   Neck pain 07/11/2014   Myalgia 07/11/2014   Dysesthesia 07/11/2014    ONSET DATE: DOS 04/10/23  REFERRING DIAG: M65.4 (ICD-10-CM) - Tendinitis, de Quervain's   THERAPY DIAG:  Localized edema  Muscle weakness (generalized)  Other lack of coordination  Pain in left hand  Pain in left wrist  Stiffness of left hand, not elsewhere classified  Stiffness of left wrist, not elsewhere classified  Rationale for Evaluation and Treatment: Rehabilitation  PERTINENT HISTORY: Per referral note:S/p de'quervains; SPLINT and OT 04/25/23 if possible after her PO with us   She states that she is a bit sore.  She states also being a little anxious and was told that she should not be moving yet in her words.  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  She is married and has a 60-month-old child from home she developed mommy thumb, leading to tenosynovitis as well as this recent surgery.  Apparently, her capsule was a bit weak and needed a suture for repair after this typical surgery-she also needed some tenolysis due to adherent tendons.  Today she appears stiffly guarded at the shoulder, elbow, forearm and hand-arriving in  a bulky dressing.  PRECAUTIONS: None;  RED FLAGS:  None   WEIGHT BEARING RESTRICTIONS: Yes nonweightbearing in the left thumb/hand/arm for at least the next month   SUBJECTIVE:   SUBJECTIVE STATEMENT: 2 weeks and 4 days post DQ release with compartment repair and DSRN lysis, as well as APL/EPB tenolysis.  She states doing much better now and hand is no longer shaking or tremoring.  She still has some difficulty with thumb and deviation motion    PAIN:  Are you having pain? Yes: NPRS scale:  1-2/10 up to 3-4/10 in past 2 days Pain location: Lt wrist/thumb Pain description: aching, throbbing   Aggravating factors: Attempted motion or gripping Relieving factors: Rest  PATIENT GOALS: To improve the use of her dominant left hand and arm    OBJECTIVE: (All objective assessments below are from initial evaluation on: 04/28/2023 unless otherwise specified.)   HAND DOMINANCE: Left  ADLs: Overall ADLs: States decreased ability to grab, hold household objects, pain and difficulty to open containers, perform FMS tasks (manipulate fasteners on clothing), mild to moderate bathing problems as well.    FUNCTIONAL OUTCOME MEASURES: Eval: Quick DASH 66% impairment today  (Higher % Score  =  More Impairment)     UPPER EXTREMITY ROM     Shoulder to Wrist AROM Left eval Lt 05/01/23  Elbow flexion 146 150- Normal  Elbow extension (-15) 0  Forearm supination 78 86  Forearm pronation  22 87  Wrist flexion 38 28  Wrist extension (-11) 14  Wrist Ulnar Deviation  12  Wrist Radial Deviation  9  (Blank rows = not tested)   Hand AROM Left eval  Full Fist Ability (or Gap to Distal Palmar Crease) 3cm gap from tip of MF to [facility]  Thumb Opposition  (Kapandji Scale)  0/10  Thumb MCP (0-60)   Thumb IP (0-80)   Thumb Radial Abduction Span    Thumb Palmar Abduction Span    (Blank rows = not tested)   UPPER EXTREMITY MMT:    Eval:  NT at eval due to recent and still healing injuries. Will be tested when appropriate.   MMT Left TBD  Shoulder flexion   Shoulder abduction   Shoulder adduction   Shoulder extension   Shoulder internal rotation   Shoulder external rotation   Middle trapezius   Lower trapezius   Elbow flexion   Elbow extension   Forearm supination   Forearm pronation   Wrist flexion   Wrist extension   Wrist ulnar deviation   Wrist radial deviation   (Blank rows = not tested)  HAND FUNCTION: Eval: Observed weakness in affected Lt hand.  Details will be tested when safe Grip strength Right: 58 lbs, Left: NT lbs   COORDINATION: 05/01/23:  9 Hole Peg Test Left: 1  min 1 sec  sec (22 sec is WFL)   Eval: Observed coordination impairments with affected Lt hand.  Details will be tested when appropriate   SENSATION: Eval: LT diminished around sx area and radial nerve distribution to thumb   EDEMA:   Eval:  Mildly swollen in Lt hand and wrist today  COGNITION: Eval: Overall cognitive status: WFL for evaluation today though overtly anxious   OBSERVATIONS:   Eval: She is not overly hypersensitive but she is guarded from pain and holding her arm stiffly by her side.  She is also holding her fingers stiffly as well as her thumb and has some palpable spasms in her left bicep as well as  some shoulder stiffness.  She also shakes seemingly nervously or uncontrollably when out of her bulky dressing-though this stops when her eyes are closed   TODAY'S TREATMENT:  05/01/23: OT starts by adjusting her orthosis as it is putting slight pressure proximally near her elbow only when she flexes her elbow.  She states is much better after that.  Next she performs active range of motion for review of exercise as well as new measures which shows significant improvement at the shoulder and elbow and forearm today.  The wrist and hand are still relatively tight but also much better.  OT educates on desensitization especially using vibration and brushing techniques with a terry cloth towel.  She does have several areas where she appears hypersensitive along the radial nerve and median nerves, and so OT also educates her on new nerve gliding for these 2 nerves as bolded below.  She has more difficulty with the radial nerve glide and is only able to do a small portion of this and very gentle way which she is encouraged to do very gently and on painfully.  It will improve with time.  She performs the rest of her home exercises back to show understanding, and then she also performs fine motor skill functional activity nine-hole peg test.  This activity causes her to perform many complex  and dynamic coordinated and motions together, and this was explained to her with her also being encouraged to do some light functional activities at home in a controlled environment (like putting marshmallows into hot chocolate).  She leaves stating feeling better, no significant pain and more confidence to face her problems.  Exercises - Seated Scapular Retraction  - 4-6 x daily - 5 reps - 5 sec hold - Reach arms upward   - 4-6 x daily - 10 reps - Turn [employer] Facing Up & Down  - 4-6 x daily - 10-15 reps - Bend and Pull Back Wrist SLOWLY  - 4 x daily - 10-15 reps - Windshield Wipers   - 4 x daily - 10-15 reps - Tendon Glides  - 4-6 x daily - 3-5 reps - 2-3 seconds hold - Seated Thumb Circumduction AROM  - 4-6 x daily - 10-15 reps - Thumb Opposition  - 4-6 x daily - 10 reps - Median Nerve Flossing  - 2-3 x daily - 5-10 reps - Standing Radial Nerve Glide  - 4-6 x daily - 1 sets - 10-15 reps   PATIENT EDUCATION: Education details: See tx section above for details  Person educated: Patient Education method: Verbal Instruction, Teach back, Handouts  Education comprehension: States and demonstrates understanding, Additional Education required    HOME EXERCISE PROGRAM: Access Code: [email or web address] URL: [email or web address] Date: 04/28/2023 Prepared by: Melvenia Ada   GOALS: Goals reviewed with patient? Yes   SHORT TERM GOALS: (STG required if POC>30 days) Target Date: 05/16/2023  Pt will obtain protective, custom orthotic. Goal status: 04/28/2023 MET though this may need adjusted in upcoming sessions  2.  Pt will demo/state understanding of initial HEP to improve pain levels and prerequisite motion. Goal status: 05/01/2023: Met   LONG TERM GOALS: Target Date: 06/13/2023  Pt will improve functional ability by decreased impairment per Quick DASH assessment from 66% to 15% or better, for better quality of life. Goal status: INITIAL  2.  Pt will improve grip strength in  left hand from NTlbs to at least 40lbs for functional use at home and in IADLs. Goal status: INITIAL  3.  Pt  will improve A/ROM in left wrist flexion/extension from 38/(-11) to at least 55 degrees each, to have functional motion for tasks like reach and grasp.  Goal status: INITIAL  4.  Pt will improve strength in left wrist flexion/extension from NT MMT to at least 4/5 MMT to have increased functional ability to carry out selfcare and higher-level homecare tasks with less difficulty. Goal status: INITIAL  5.  Pt will improve coordination skills in left hand, as seen by within functional limit score on nine-hole peg testing to have increased functional ability to carry out fine motor tasks (fasteners, etc.) and more complex, coordinated IADLs (meal prep, sports, etc.).  Goal status: INITIAL  6.  Pt will decrease pain at worst from 6/10 to 3/10 or better to have better sleep and occupational participation in daily roles. Goal status: INITIAL   ASSESSMENT:  CLINICAL IMPRESSION: 05/01/23: She is doing so much better and is highly encouraged by her progress.  She no longer has any tremoring or shaking in her hand and has been able to control that anxious and nerve pain aspect of her recovery.  Still having some problems moving into ulnar deviation and sensitivity with radial deviation of course.  Continue on   PLAN:  OT FREQUENCY: 1-2x/week  OT DURATION: 6 weeks through 06/13/2023 and up to 12 total visits as needed  PLANNED INTERVENTIONS: 97168 OT Re-evaluation, 97535 self care/ADL training, 02889 therapeutic exercise, 97530 therapeutic activity, 97112 neuromuscular re-education, 97140 manual therapy, 97035 ultrasound, 97018 paraffin, 02960 fluidotherapy, 97010 moist heat, 97010 cryotherapy, 97034 contrast bath, 97760 Orthotics management and training, 02239 Splinting (initial encounter), [id number] Subsequent splinting/medication, scar mobilization, passive range of motion, compression bandaging,  Dry needling, coping strategies training, patient/family education, and DME and/or AE instructions  CONSULTED AND AGREED WITH PLAN OF CARE: Patient  PLAN FOR NEXT SESSION:   Attempt to add very light stretches at the hand wrist and forearm as tolerated   Melvenia Ada, OTR/L, CHT 05/01/2023, 3:37 PM

## 2023-05-01 ENCOUNTER — Ambulatory Visit: Payer: 59 | Admitting: Rehabilitative and Restorative Service Providers"

## 2023-05-01 ENCOUNTER — Encounter: Payer: Self-pay | Admitting: Rehabilitative and Restorative Service Providers"

## 2023-05-01 DIAGNOSIS — M25532 Pain in left wrist: Secondary | ICD-10-CM

## 2023-05-01 DIAGNOSIS — R278 Other lack of coordination: Secondary | ICD-10-CM

## 2023-05-01 DIAGNOSIS — R600 Localized edema: Secondary | ICD-10-CM | POA: Diagnosis not present

## 2023-05-01 DIAGNOSIS — M6281 Muscle weakness (generalized): Secondary | ICD-10-CM

## 2023-05-01 DIAGNOSIS — M79642 Pain in left hand: Secondary | ICD-10-CM

## 2023-05-01 DIAGNOSIS — M25632 Stiffness of left wrist, not elsewhere classified: Secondary | ICD-10-CM

## 2023-05-01 DIAGNOSIS — M25642 Stiffness of left hand, not elsewhere classified: Secondary | ICD-10-CM | POA: Diagnosis not present

## 2023-05-06 NOTE — Therapy (Signed)
 OUTPATIENT OCCUPATIONAL THERAPY TREATMENT NOTE  Patient Name: Annette Parks MRN: [id number] DOB:1990/11/24, 33 y.o., female Today's Date: 05/07/2023  PCP: Watt Raisin, MD REFERRING PROVIDER: Arlinda Buster, MD   END OF SESSION:  OT End of Session - 05/07/23 1154     Visit Number 3    Number of Visits 12    Date for OT Re-Evaluation 06/13/23    Authorization Type [employer]    OT Start Time 1150    OT Stop Time 1239    OT Time Calculation (min) 49 min    Equipment Utilized During Treatment yellow putty    Activity Tolerance Patient tolerated treatment well;No increased pain;Patient limited by fatigue;Patient limited by pain    Behavior During Therapy [facility] for tasks assessed/performed               Past Medical History:  Diagnosis Date   Anal fissure    Hashimoto's disease    IBS (irritable bowel syndrome)    Migraine headache    Pelvic floor dysfunction?    Perforated eardrum    PONV (postoperative nausea and vomiting)    Pregnancy induced hypertension    UTI (urinary tract infection)    Vaginal Pap smear, abnormal    Past Surgical History:  Procedure Laterality Date   DILATION AND EVACUATION  09/06/2021   EAR TUBE REMOVAL     TONSILLECTOMY AND ADENOIDECTOMY     TYMPANOPLASTY Right    TYMPANOSTOMY TUBE PLACEMENT     x 3   WISDOM TOOTH EXTRACTION     Patient Active Problem List   Diagnosis Date Noted   Pregnancy 10/25/2022   Palpitations 01/18/2020   Closed fracture of fifth metacarpal bone of right hand 11/05/2019   Pain of right shoulder joint on movement 10/26/2019   Acute pain of right wrist 10/26/2019   Hashimoto's thyroiditis 09/04/2018   Hair loss 09/04/2018   Conductive hearing loss of right ear with unrestricted hearing of left ear 09/26/2017   Perforation of right tympanic membrane 09/26/2017   Hip pain, chronic, left 08/13/2017   Lower back pain 08/11/2017   Trochanteric bursitis of left hip 07/31/2017   Sacroiliac (ligament) sprain  07/31/2017   Common migraine 05/07/2016   Eye pain, bilateral 05/07/2016   Facial pain 05/07/2016   GAD (generalized anxiety disorder) 03/22/2015   Neck pain 07/11/2014   Myalgia 07/11/2014   Dysesthesia 07/11/2014    ONSET DATE: DOS 04/10/23  REFERRING DIAG: M65.4 (ICD-10-CM) - Tendinitis, de Quervain's   THERAPY DIAG:  Localized edema  Muscle weakness (generalized)  Other lack of coordination  Pain in left hand  Pain in left wrist  Stiffness of left hand, not elsewhere classified  Stiffness of left wrist, not elsewhere classified  Rationale for Evaluation and Treatment: Rehabilitation  PERTINENT HISTORY: Per referral note:S/p de'quervains; SPLINT and OT 04/25/23 if possible after her PO with us   She states that she is a bit sore.  She states also being a little anxious and was told that she should not be moving yet in her words.  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  She is married and has a 42-month-old child from home she developed mommy thumb, leading to tenosynovitis as well as this recent surgery.  Apparently, her capsule was a bit weak and needed a suture for repair after this typical surgery-she also needed some tenolysis due to adherent tendons.  Today she appears stiffly guarded at the shoulder, elbow, forearm and hand-arriving in a  bulky dressing.  PRECAUTIONS: None;  RED FLAGS:  None   WEIGHT BEARING RESTRICTIONS: Yes nonweightbearing in the left thumb/hand/arm for at least the next month   SUBJECTIVE:   SUBJECTIVE STATEMENT: ~4 weeks post DQ release with compartment repair and DSRN lysis, as well as APL/EPB tenolysis.  She states weight bearing still painful, but not wearing brace as much and MD says she's doing well.    PAIN:  Are you having pain? Yes: NPRS scale:  0/10 up to 1-2/10 in past week Pain location: Lt wrist/thumb Pain description: aching, throbbing  Aggravating factors: Attempted motion or gripping Relieving factors:  Rest  PATIENT GOALS: To improve the use of her dominant left hand and arm    OBJECTIVE: (All objective assessments below are from initial evaluation on: 04/28/2023 unless otherwise specified.)   HAND DOMINANCE: Left  ADLs: Overall ADLs: States decreased ability to grab, hold household objects, pain and difficulty to open containers, perform FMS tasks (manipulate fasteners on clothing), mild to moderate bathing problems as well.    FUNCTIONAL OUTCOME MEASURES: Eval: Quick DASH 66% impairment today  (Higher % Score  =  More Impairment)     UPPER EXTREMITY ROM     Shoulder to Wrist AROM Left eval Lt 05/01/23 Lt 05/07/23  Elbow flexion 146 150- Normal   Elbow extension (-15) 0   Forearm supination 78 86   Forearm pronation  22 87   Wrist flexion 38 28 45  Wrist extension (-11) 14 25  Wrist Ulnar Deviation  12   Wrist Radial Deviation  9   (Blank rows = not tested)   Hand AROM Left eval Lt 05/07/23  Full Fist Ability (or Gap to Distal Palmar Crease) 3cm gap from tip of MF to [facility] Loose full fist  Thumb Opposition  (Kapandji Scale)  0/10 5/10  Thumb MCP (0-60)    Thumb IP (0-80)    Thumb Radial Abduction Span     Thumb Palmar Abduction Span     (Blank rows = not tested)   UPPER EXTREMITY MMT:    Eval:  NT at eval due to recent and still healing injuries. Will be tested when appropriate.   MMT Left TBD  Shoulder flexion   Shoulder abduction   Shoulder adduction   Shoulder extension   Shoulder internal rotation   Shoulder external rotation   Middle trapezius   Lower trapezius   Elbow flexion   Elbow extension   Forearm supination   Forearm pronation   Wrist flexion   Wrist extension   Wrist ulnar deviation   Wrist radial deviation   (Blank rows = not tested)  HAND FUNCTION: Eval: Observed weakness in affected Lt hand.  Details will be tested when safe Grip strength Right: 58 lbs, Left: NT lbs   COORDINATION: 05/07/23: 9HPT: 27sec   05/01/23:  9 Hole Peg Test  Left: 1 min 1 sec  sec (22 sec is WFL)   Eval: Observed coordination impairments with affected Lt hand.  Details will be tested when appropriate   SENSATION: Eval: LT diminished around sx area and radial nerve distribution to thumb   EDEMA:   Eval:  Mildly swollen in Lt hand and wrist today  OBSERVATIONS:   Eval: She is not overly hypersensitive but she is guarded from pain and holding her arm stiffly by her side.  She is also holding her fingers stiffly as well as her thumb and has some palpable spasms in her left bicep as well as some  shoulder stiffness.  She also shakes seemingly nervously or uncontrollably when out of her bulky dressing-though this stops when her eyes are closed   TODAY'S TREATMENT:  05/07/23: She performs active range of motion again for exercise review as well as new measures which shows improvements by leaps and bounds since Monday.  Due to that and her confidence today, OT upgrades her to stretches at the wrist and 2 planes of motion as well as thumb stretches and light hand strengthening with therapy putty activities.  These things are as bolded below, and she tolerated them very well with some tenderness around the thumb and surgical area.  She was educated to not do these things painfully but rather slowly and progressively.   Additionally, for self-care and functional tasks she should be leaving off her orthosis now, as tolerated and only wear it if she is doing something painful, repetitive, or needs to rest.  She leaves in no pain and stating understanding all directions from the new exercises and putty activities.   Exercises - Wrist Flexion Stretch  - 4 x daily - 3-5 reps - 15 sec hold - Wrist Prayer Stretch  - 4 x daily - 3-5 reps - 15 sec hold - SPATULA stretch   - 3-4 x daily - 3 reps - 15 sec hold - Seated Composite Thumb Flexion PROM  - 2-3 x daily - 3-5 reps - 15 sec hold - Bend and Pull Back Wrist SLOWLY  - 4 x daily - 5 reps - Windshield Wipers   -  4 x daily - 5 reps - Seated Thumb Circumduction AROM  - 4-6 x daily - 5 reps - Full Fist  - 2-3 x daily - 5 reps - Duck Mouth Strength  - 2-3 x daily - 5 reps - Thumb Press  - 2-3 x daily - 5 reps - Tendon Glides  - 4-6 x daily - 3-5 reps - 2-3 seconds hold - Thumb Opposition  - 4-6 x daily - 10 reps - Median Nerve Flossing  - 2-3 x daily - 5-10 reps - Standing Radial Nerve Glide  - 4-6 x daily - 1 sets - 10-15 reps   PATIENT EDUCATION: Education details: See tx section above for details  Person educated: Patient Education method: Verbal Instruction, Teach back, Handouts  Education comprehension: States and demonstrates understanding, Additional Education required    HOME EXERCISE PROGRAM: Access Code: [email or web address] URL: [email or web address] Date: 04/28/2023 Prepared by: Melvenia Ada   GOALS: Goals reviewed with patient? Yes   SHORT TERM GOALS: (STG required if POC>30 days) Target Date: 05/16/2023  Pt will obtain protective, custom orthotic. Goal status: 04/28/2023 MET though this may need adjusted in upcoming sessions  2.  Pt will demo/state understanding of initial HEP to improve pain levels and prerequisite motion. Goal status: 05/01/2023: Met   LONG TERM GOALS: Target Date: 06/13/2023  Pt will improve functional ability by decreased impairment per Quick DASH assessment from 66% to 15% or better, for better quality of life. Goal status: INITIAL  2.  Pt will improve grip strength in left hand from NTlbs to at least 40lbs for functional use at home and in IADLs. Goal status: INITIAL  3.  Pt will improve A/ROM in left wrist flexion/extension from 38/(-11) to at least 55 degrees each, to have functional motion for tasks like reach and grasp.  Goal status: INITIAL  4.  Pt will improve strength in left wrist flexion/extension from NT MMT to at least 4/5 MMT to  have increased functional ability to carry out selfcare and higher-level homecare tasks with less  difficulty. Goal status: INITIAL  5.  Pt will improve coordination skills in left hand, as seen by within functional limit score on nine-hole peg testing to have increased functional ability to carry out fine motor tasks (fasteners, etc.) and more complex, coordinated IADLs (meal prep, sports, etc.).  Goal status: INITIAL  6.  Pt will decrease pain at worst from 6/10 to 3/10 or better to have better sleep and occupational participation in daily roles. Goal status: INITIAL   ASSESSMENT:  CLINICAL IMPRESSION: 05/07/23: She is moving very quickly now and tolerating stretches and light hand strengthening activities with therapy putty.  Will attempt to try to increase to arm strengthening in the next session  05/01/23: She is doing so much better and is highly encouraged by her progress.  She no longer has any tremoring or shaking in her hand and has been able to control that anxious and nerve pain aspect of her recovery.  Still having some problems moving into ulnar deviation and sensitivity with radial deviation of course.  Continue on   PLAN:  OT FREQUENCY: 1-2x/week  OT DURATION: 6 weeks through 06/13/2023 and up to 12 total visits as needed  PLANNED INTERVENTIONS: 97168 OT Re-evaluation, 97535 self care/ADL training, 02889 therapeutic exercise, 97530 therapeutic activity, 97112 neuromuscular re-education, 97140 manual therapy, 97035 ultrasound, 97018 paraffin, 02960 fluidotherapy, 97010 moist heat, 97010 cryotherapy, 97034 contrast bath, 97760 Orthotics management and training, 02239 Splinting (initial encounter), [id number] Subsequent splinting/medication, scar mobilization, passive range of motion, compression bandaging, Dry needling, coping strategies training, patient/family education, and DME and/or AE instructions  CONSULTED AND AGREED WITH PLAN OF CARE: Patient  PLAN FOR NEXT SESSION:   Check new stretches, range of motion, therapy putty and hand strengthening and then upgrade to forearm  strengthening as tolerated but very carefully   Melvenia Ada, OTR/L, CHT 05/07/2023, 12:54 PM

## 2023-05-07 ENCOUNTER — Ambulatory Visit: Payer: Commercial Managed Care - PPO | Admitting: Rehabilitative and Restorative Service Providers"

## 2023-05-07 ENCOUNTER — Ambulatory Visit: Payer: Commercial Managed Care - PPO | Admitting: Orthopedic Surgery

## 2023-05-07 ENCOUNTER — Encounter: Payer: Self-pay | Admitting: Rehabilitative and Restorative Service Providers"

## 2023-05-07 DIAGNOSIS — M25532 Pain in left wrist: Secondary | ICD-10-CM

## 2023-05-07 DIAGNOSIS — M25632 Stiffness of left wrist, not elsewhere classified: Secondary | ICD-10-CM | POA: Diagnosis not present

## 2023-05-07 DIAGNOSIS — R600 Localized edema: Secondary | ICD-10-CM | POA: Diagnosis not present

## 2023-05-07 DIAGNOSIS — R278 Other lack of coordination: Secondary | ICD-10-CM | POA: Diagnosis not present

## 2023-05-07 DIAGNOSIS — M79642 Pain in left hand: Secondary | ICD-10-CM

## 2023-05-07 DIAGNOSIS — M654 Radial styloid tenosynovitis [de Quervain]: Secondary | ICD-10-CM

## 2023-05-07 DIAGNOSIS — M25642 Stiffness of left hand, not elsewhere classified: Secondary | ICD-10-CM | POA: Diagnosis not present

## 2023-05-07 DIAGNOSIS — M6281 Muscle weakness (generalized): Secondary | ICD-10-CM | POA: Diagnosis not present

## 2023-05-07 NOTE — Progress Notes (Signed)
   [name] - 33 y.o. female MRN [id number]  Date of birth: 04-13-1991  Office Visit Note: Visit Date: 05/07/2023 PCP: Watt Harlene BROCKS, MD Referred by: Watt Harlene BROCKS, MD  Subjective:  HPI: Annette Parks is a 33 y.o. female who presents today for follow up 4 weeks status post left wrist first extensor compartment release and DRSN neurolysis.  Pertinent ROS were reviewed with the patient and found to be negative unless otherwise specified above in HPI.   Assessment & Plan: Visit Diagnoses: No diagnosis found.  Plan: She is doing very well overall and has made excellent improvement with occupational therapy.  Continue with range of motion exercises as instructed can begin to wean from the splint.  Can progress to strengthening as tolerated as well.  I will follow-up with her in approximately 6 weeks time.  Follow-up: No follow-ups on file.   Meds & Orders: No orders of the defined types were placed in this encounter.  No orders of the defined types were placed in this encounter.    Procedures: No procedures performed       Objective:   Vital Signs: There were no vitals taken for this visit.  Ortho Exam Well-healing incision over the radial aspect of the wrist, skin edges well-approximated without erythema or drainage - Thumb opposition to the small finger DIP without significant pain - Thumb circumduction without pain - Minimally positive Tinel's over the DRSN region  Imaging: No results found.   Lanita Stammen Afton Alderton, M.D. [facility] 10:11 AM

## 2023-05-07 NOTE — Therapy (Signed)
 OUTPATIENT OCCUPATIONAL THERAPY TREATMENT NOTE  Patient Name: Annette Parks MRN: [id number] DOB:04-Aug-1990, 33 y.o., female Today's Date: 05/09/2023  PCP: Watt Raisin, MD REFERRING PROVIDER: Arlinda Buster, MD   END OF SESSION:  OT End of Session - 05/09/23 0929     Visit Number 4    Number of Visits 12    Date for OT Re-Evaluation 06/13/23    Authorization Type [employer]    OT Start Time [phone]    OT Stop Time 1018    OT Time Calculation (min) 45 min    Activity Tolerance Patient tolerated treatment well;No increased pain;Patient limited by fatigue;Patient limited by pain    Behavior During Therapy [facility] for tasks assessed/performed                Past Medical History:  Diagnosis Date   Anal fissure    Hashimoto's disease    IBS (irritable bowel syndrome)    Migraine headache    Pelvic floor dysfunction?    Perforated eardrum    PONV (postoperative nausea and vomiting)    Pregnancy induced hypertension    UTI (urinary tract infection)    Vaginal Pap smear, abnormal    Past Surgical History:  Procedure Laterality Date   DILATION AND EVACUATION  09/06/2021   EAR TUBE REMOVAL     TONSILLECTOMY AND ADENOIDECTOMY     TYMPANOPLASTY Right    TYMPANOSTOMY TUBE PLACEMENT     x 3   WISDOM TOOTH EXTRACTION     Patient Active Problem List   Diagnosis Date Noted   Pregnancy 10/25/2022   Palpitations 01/18/2020   Closed fracture of fifth metacarpal bone of right hand 11/05/2019   Pain of right shoulder joint on movement 10/26/2019   Acute pain of right wrist 10/26/2019   Hashimoto's thyroiditis 09/04/2018   Hair loss 09/04/2018   Conductive hearing loss of right ear with unrestricted hearing of left ear 09/26/2017   Perforation of right tympanic membrane 09/26/2017   Hip pain, chronic, left 08/13/2017   Lower back pain 08/11/2017   Trochanteric bursitis of left hip 07/31/2017   Sacroiliac (ligament) sprain 07/31/2017   Common migraine 05/07/2016   Eye pain,  bilateral 05/07/2016   Facial pain 05/07/2016   GAD (generalized anxiety disorder) 03/22/2015   Neck pain 07/11/2014   Myalgia 07/11/2014   Dysesthesia 07/11/2014    ONSET DATE: DOS 04/10/23  REFERRING DIAG: M65.4 (ICD-10-CM) - Tendinitis, de Quervain's   THERAPY DIAG:  Localized edema  Muscle weakness (generalized)  Other lack of coordination  Pain in left hand  Pain in left wrist  Stiffness of left hand, not elsewhere classified  Stiffness of left wrist, not elsewhere classified  Rationale for Evaluation and Treatment: Rehabilitation  PERTINENT HISTORY: Per referral note:S/p de'quervains; SPLINT and OT 04/25/23 if possible after her PO with us   She states that she is a bit sore.  She states also being a little anxious and was told that she should not be moving yet in her words.  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  She is married and has a 20-month-old child from home she developed mommy thumb, leading to tenosynovitis as well as this recent surgery.  Apparently, her capsule was a bit weak and needed a suture for repair after this typical surgery-she also needed some tenolysis due to adherent tendons.  Today she appears stiffly guarded at the shoulder, elbow, forearm and hand-arriving in a bulky dressing.  PRECAUTIONS: None;  RED FLAGS:  None   WEIGHT BEARING RESTRICTIONS: Yes nonweightbearing in the left thumb/hand/arm for at least the next month   SUBJECTIVE:   SUBJECTIVE STATEMENT: 4 weeks post DQ release with compartment repair and DSRN lysis, as well as APL/EPB tenolysis.  She states having some very mild tenderness around the dorsum of her wrist near the scaphoid area.  Seems to be related to increased functional activities and use of the arm.  She states trying to avoid pain with exercises and that nothing is very aggravating for her.  PAIN:  Are you having pain? Yes: NPRS scale:  2/10 minor soreness Pain location: Lt wrist/thumb Pain description:  aching, throbbing  Aggravating factors: Attempted motion or gripping Relieving factors: Rest  PATIENT GOALS: To improve the use of her dominant left hand and arm    OBJECTIVE: (All objective assessments below are from initial evaluation on: 04/28/2023 unless otherwise specified.)   HAND DOMINANCE: Left  ADLs: Overall ADLs: States decreased ability to grab, hold household objects, pain and difficulty to open containers, perform FMS tasks (manipulate fasteners on clothing), mild to moderate bathing problems as well.    FUNCTIONAL OUTCOME MEASURES: Eval: Quick DASH 66% impairment today  (Higher % Score  =  More Impairment)     UPPER EXTREMITY ROM     Shoulder to Wrist AROM Left eval Lt 05/01/23 Lt 05/07/23 Lt 05/09/23  Elbow flexion 146 150- Normal    Elbow extension (-15) 0    Forearm supination 78 86    Forearm pronation  22 87    Wrist flexion 38 28 45 44  Wrist extension (-11) 14 25 52  Wrist Ulnar Deviation  12    Wrist Radial Deviation  9    (Blank rows = not tested)   Hand AROM Left eval Lt 05/07/23 Lt 05/09/23  Full Fist Ability (or Gap to Distal Palmar Crease) 3cm gap from tip of MF to [facility] Loose full fist Loose fist  Thumb Opposition  (Kapandji Scale)  0/10 5/10 8/10  Thumb MCP (0-60)     Thumb IP (0-80)     Thumb Radial Abduction Span      Thumb Palmar Abduction Span      (Blank rows = not tested)   UPPER EXTREMITY MMT:     MMT Left 05/09/23  Elbow flexion 5/5  Elbow extension 5/5  Forearm supination 3+/5 tender  Forearm pronation 3+/5 tender  Wrist flexion 4+/5  Wrist extension 4-/5 slight tender  Wrist ulnar deviation 4+/5  Wrist radial deviation 3+/5 painful  (Blank rows = not tested)  HAND FUNCTION: 05/09/23: Grip strength Left: 19 lbs   Eval: Observed weakness in affected Lt hand.  Details will be tested when safe Grip strength Right: 58 lbs, Left: NT lbs   COORDINATION: 05/07/23: 9HPT: 27sec   05/01/23:  9 Hole Peg Test Left: 1 min 1 sec  sec  (22 sec is WFL)   Eval: Observed coordination impairments with affected Lt hand.  Details will be tested when appropriate  SENSATION: Eval: LT diminished around sx area and radial nerve distribution to thumb   EDEMA:   Eval:  Mildly swollen in Lt hand and wrist today  OBSERVATIONS:   Eval: She is not overly hypersensitive but she is guarded from pain and holding her arm stiffly by her side.  She is also holding her fingers stiffly as well as her thumb and has some palpable spasms in her left bicep as well as some shoulder stiffness.  She also shakes  seemingly nervously or uncontrollably when out of her bulky dressing-though this stops when her eyes are closed   TODAY'S TREATMENT:  05/09/23: She starts with active range of motion for review of exercises as well as new measures which shows excellent progress doubling her wrist extension today.  As she is having some soreness through her joints, OT attempts to perform light joint mobilizations hearing 1 or 2 small pops that are somewhat relieving to her.  The glue around her scar is actually causing some irritation and poking her wrist when OT tries to manipulate it, so OT carefully removed surgical glue and she is well-healed beneath.  OT applies a slick of oil-based lotion to act as a barrier and also trap and moisture and recommends her to do this as well.  Joint mobilizations are more effective after this and gentle stretches to the wrist that were reviewed are tolerated better.  She does have some soreness over the dorsal wrist and scaphoid area which may be from nerve pain or simply soreness from doubling her motion and just 2 days.  OT recommends her to do light massages and touching and rest more often if needed.  She can even slip her brace back on or wear her compressive brace to feel some support and comfort if she needs to rest.  Additionally OT educates on proprioceptive or joint position sense activities that can be done to help increase  her joint position sense and decrease pain in the wrist.  The activity she tried today was a power ball, a rotary activity with a ball and a cup, a ball on the wall light press with dynamic mobilization, and she was educated on joint position sense cell phone game she could download like labyrinth games.  She states her wrist feels somewhat better after performing these and only mildly sore.  She tolerates gripping today for initial baseline measures and reviews therapy putty though she had not had much time to work with that yet at home.  Lastly, OT teaches her very light isometric forearm and wrist strengthening this should be done very lightly and gently to activate the muscles without any significant motion or pain through the wrist or forearm.  These were explained, demonstrated and she demonstrates back tolerably in 4 planes of motion.  Radial deviation is obviously the most tender and she was told to be very careful with this at home if attempting.     Exercises - Wrist Flexion Stretch  - 4 x daily - 3-5 reps - 15 sec hold - Wrist Prayer Stretch  - 4 x daily - 3-5 reps - 15 sec hold - SPATULA stretch   - 3-4 x daily - 3 reps - 15 sec hold - Seated Composite Thumb Flexion PROM  - 2-3 x daily - 3-5 reps - 15 sec hold - Bend and Pull Back Wrist SLOWLY  - 4 x daily - 5 reps - Windshield Wipers   - 4 x daily - 5 reps - Seated Thumb Circumduction AROM  - 4-6 x daily - 5 reps - Full Fist  - 2-3 x daily - 5 reps - Duck Mouth Strength  - 2-3 x daily - 5 reps - Thumb Press  - 2-3 x daily - 5 reps - Tendon Glides  - 4-6 x daily - 3-5 reps - 2-3 seconds hold - Thumb Opposition  - 4-6 x daily - 10 reps - Median Nerve Flossing  - 2-3 x daily - 5-10 reps - Standing  Radial Nerve Glide  - 4-6 x daily - 1 sets - 10-15 reps - Seated Isometric Wrist Flexion Supinated with Manual Resistance  - 1-3 x daily - 3-4 x weekly - 3-5 reps - 5-10 sec hold - Seated Isometric Wrist Ulnar Deviation with Manual  Resistance  - 2-3 x daily - 4-5 x weekly - 5 reps - 5-10 hold - Isometric Wrist Extension Pronated  - 2-3 x daily - 4-5 x weekly - 5 reps - 5-10 seconds hold - Seated Isometric Wrist Radial Deviation with Manual Resistance  - 4-6 x daily - 1 sets - 10-15 reps    05/07/23: She performs active range of motion again for exercise review as well as new measures which shows improvements by leaps and bounds since Monday.  Due to that and her confidence today, OT upgrades her to stretches at the wrist and 2 planes of motion as well as thumb stretches and light hand strengthening with therapy putty activities.  These things are as bolded below, and she tolerated them very well with some tenderness around the thumb and surgical area.  She was educated to not do these things painfully but rather slowly and progressively.   Additionally, for self-care and functional tasks she should be leaving off her orthosis now, as tolerated and only wear it if she is doing something painful, repetitive, or needs to rest.  She leaves in no pain and stating understanding all directions from the new exercises and putty activities.   Exercises - Wrist Flexion Stretch  - 4 x daily - 3-5 reps - 15 sec hold - Wrist Prayer Stretch  - 4 x daily - 3-5 reps - 15 sec hold - SPATULA stretch   - 3-4 x daily - 3 reps - 15 sec hold - Seated Composite Thumb Flexion PROM  - 2-3 x daily - 3-5 reps - 15 sec hold - Bend and Pull Back Wrist SLOWLY  - 4 x daily - 5 reps - Windshield Wipers   - 4 x daily - 5 reps - Seated Thumb Circumduction AROM  - 4-6 x daily - 5 reps - Full Fist  - 2-3 x daily - 5 reps - Duck Mouth Strength  - 2-3 x daily - 5 reps - Thumb Press  - 2-3 x daily - 5 reps - Tendon Glides  - 4-6 x daily - 3-5 reps - 2-3 seconds hold - Thumb Opposition  - 4-6 x daily - 10 reps - Median Nerve Flossing  - 2-3 x daily - 5-10 reps - Standing Radial Nerve Glide  - 4-6 x daily - 1 sets - 10-15 reps   PATIENT  EDUCATION: Education details: See tx section above for details  Person educated: Patient Education method: Verbal Instruction, Teach back, Handouts  Education comprehension: States and demonstrates understanding, Additional Education required    HOME EXERCISE PROGRAM: Access Code: [email or web address] URL: [email or web address] Date: 04/28/2023 Prepared by: Melvenia Ada   GOALS: Goals reviewed with patient? Yes   SHORT TERM GOALS: (STG required if POC>30 days) Target Date: 05/16/2023  Pt will obtain protective, custom orthotic. Goal status: 04/28/2023 MET though this may need adjusted in upcoming sessions  2.  Pt will demo/state understanding of initial HEP to improve pain levels and prerequisite motion. Goal status: 05/01/2023: Met   LONG TERM GOALS: Target Date: 06/13/2023  Pt will improve functional ability by decreased impairment per Quick DASH assessment from 66% to 15% or better, for better quality of life. Goal status: INITIAL  2.  Pt will improve grip strength in left hand from NTlbs to at least 40lbs for functional use at home and in IADLs. Goal status: INITIAL  3.  Pt will improve A/ROM in left wrist flexion/extension from 38/(-11) to at least 55 degrees each, to have functional motion for tasks like reach and grasp.  Goal status: INITIAL  4.  Pt will improve strength in left wrist flexion/extension from NT MMT to at least 4/5 MMT to have increased functional ability to carry out selfcare and higher-level homecare tasks with less difficulty. Goal status: INITIAL  5.  Pt will improve coordination skills in left hand, as seen by within functional limit score on nine-hole peg testing to have increased functional ability to carry out fine motor tasks (fasteners, etc.) and more complex, coordinated IADLs (meal prep, sports, etc.).  Goal status: INITIAL  6.  Pt will decrease pain at worst from 6/10 to 3/10 or better to have better sleep and occupational  participation in daily roles. Goal status: INITIAL   ASSESSMENT:  CLINICAL IMPRESSION: 05/09/23: She is doing excellent and doubled her range of motion and extension now which may be why she feels sore near the scaphoid as she has been progressing so quickly.  She still has some shaking and anxiety.  Surgical area glue was removed today and she is well-healed beneath but it did make her anxious again to remove this glue and expose this area.  She can cover it with a Band-Aid if she prefers.  Proprioceptive activities seemed helpful to decrease pain in her wrist before performing stretches.  She also tolerated light isometric strengthening and this will be progressed next week hopefully.   PLAN:  OT FREQUENCY: 1-2x/week  OT DURATION: 6 weeks through 06/13/2023 and up to 12 total visits as needed  PLANNED INTERVENTIONS: 97168 OT Re-evaluation, 97535 self care/ADL training, 02889 therapeutic exercise, 97530 therapeutic activity, 97112 neuromuscular re-education, 97140 manual therapy, 97035 ultrasound, 97018 paraffin, 02960 fluidotherapy, 97010 moist heat, 97010 cryotherapy, 97034 contrast bath, 97760 Orthotics management and training, 02239 Splinting (initial encounter), [id number] Subsequent splinting/medication, scar mobilization, passive range of motion, compression bandaging, Dry needling, coping strategies training, patient/family education, and DME and/or AE instructions  CONSULTED AND AGREED WITH PLAN OF CARE: Patient  PLAN FOR NEXT SESSION:   Reviewed proprioceptive activities as well as new isometric strengthening, continue to address any soreness or pain or nerve problems next week.  Encouraged her to resume all activities slowly oral she may have increased soreness   Leovanni Bjorkman, OTR/L, CHT 05/09/2023, 12:22 PM

## 2023-05-09 ENCOUNTER — Ambulatory Visit: Payer: Commercial Managed Care - PPO | Admitting: Podiatry

## 2023-05-09 ENCOUNTER — Encounter: Payer: Self-pay | Admitting: Podiatry

## 2023-05-09 ENCOUNTER — Encounter: Payer: Self-pay | Admitting: Rehabilitative and Restorative Service Providers"

## 2023-05-09 ENCOUNTER — Ambulatory Visit (INDEPENDENT_AMBULATORY_CARE_PROVIDER_SITE_OTHER): Payer: Commercial Managed Care - PPO | Admitting: Rehabilitative and Restorative Service Providers"

## 2023-05-09 DIAGNOSIS — L600 Ingrowing nail: Secondary | ICD-10-CM

## 2023-05-09 DIAGNOSIS — M79642 Pain in left hand: Secondary | ICD-10-CM

## 2023-05-09 DIAGNOSIS — M25632 Stiffness of left wrist, not elsewhere classified: Secondary | ICD-10-CM

## 2023-05-09 DIAGNOSIS — M25532 Pain in left wrist: Secondary | ICD-10-CM

## 2023-05-09 DIAGNOSIS — M6281 Muscle weakness (generalized): Secondary | ICD-10-CM

## 2023-05-09 DIAGNOSIS — M25642 Stiffness of left hand, not elsewhere classified: Secondary | ICD-10-CM

## 2023-05-09 DIAGNOSIS — R600 Localized edema: Secondary | ICD-10-CM | POA: Diagnosis not present

## 2023-05-09 DIAGNOSIS — R278 Other lack of coordination: Secondary | ICD-10-CM

## 2023-05-09 NOTE — Progress Notes (Signed)
  Subjective:  Patient ID: Annette Parks, female    DOB: [birth year],   MRN: [id number]  No chief complaint on file.   33 y.o. female presents for concern of left second toenail possibly ingrown. Relates about a week ago she had soreness and pain on both sides of the nail. She relates she was able to get some pus out. She had been keeping neosporin and bandiad on it. Relates she is doing better  and infection cleared. Does still have some tenderness . Denies any other pedal complaints. Denies n/v/f/c.   Past Medical History:  Diagnosis Date   Anal fissure    Hashimoto's disease    IBS (irritable bowel syndrome)    Migraine headache    Pelvic floor dysfunction?    Perforated eardrum    PONV (postoperative nausea and vomiting)    Pregnancy induced hypertension    UTI (urinary tract infection)    Vaginal Pap smear, abnormal     Objective:  Physical Exam: Vascular: DP/PT pulses 2/4 bilateral. CFT <3 seconds. Normal hair growth on digits. No edema.  Skin. No lacerations or abrasions bilateral feet. Incurvation mild to bilateral borders of left second digit. No erythema edema or purulence noted.  Musculoskeletal: MMT 5/5 bilateral lower extremities in DF, PF, Inversion and Eversion. Deceased ROM in DF of ankle joint.  Neurological: Sensation intact to light touch.   Assessment:   1. Ingrown nail of second toe of left foot      Plan:  Patient was evaluated and treated and all questions answered. Discussed ingrown toenails etiology and treatment options including procedure for removal vs conservative care.  Discussed soaks and neosporin until pain resolves vs possible ingrown procedure. Patient elects to go conservative route.  Return in future if needed for procedure.    Asberry Failing, DPM

## 2023-05-10 ENCOUNTER — Telehealth: Payer: Commercial Managed Care - PPO | Admitting: Family Medicine

## 2023-05-10 DIAGNOSIS — J069 Acute upper respiratory infection, unspecified: Secondary | ICD-10-CM

## 2023-05-11 MED ORDER — FLUTICASONE PROPIONATE 50 MCG/ACT NA SUSP: 2.0000 | Freq: Every day | NASAL | 6 refills | Status: DC

## 2023-05-11 MED ORDER — CETIRIZINE HCL 10 MG PO TABS: 10.0000 mg | ORAL_TABLET | Freq: Every day | ORAL | 2 refills | Status: DC

## 2023-05-11 NOTE — Progress Notes (Signed)

## 2023-05-14 ENCOUNTER — Encounter: Payer: Self-pay | Admitting: Nurse Practitioner

## 2023-05-14 ENCOUNTER — Telehealth: Payer: Commercial Managed Care - PPO | Admitting: Nurse Practitioner

## 2023-05-14 ENCOUNTER — Other Ambulatory Visit: Payer: Self-pay | Admitting: Nurse Practitioner

## 2023-05-14 DIAGNOSIS — J40 Bronchitis, not specified as acute or chronic: Secondary | ICD-10-CM | POA: Diagnosis not present

## 2023-05-14 DIAGNOSIS — J0140 Acute pansinusitis, unspecified: Secondary | ICD-10-CM

## 2023-05-14 DIAGNOSIS — J0111 Acute recurrent frontal sinusitis: Secondary | ICD-10-CM

## 2023-05-14 MED ORDER — AMOXICILLIN-POT CLAVULANATE 875-125 MG PO TABS: 1.0000 | ORAL_TABLET | Freq: Two times a day (BID) | ORAL | 0 refills | Status: AC

## 2023-05-14 MED ORDER — PREDNISONE 20 MG PO TABS: 20.0000 mg | ORAL_TABLET | Freq: Two times a day (BID) | ORAL | 0 refills | Status: DC

## 2023-05-14 MED ORDER — PREDNISONE 20 MG PO TABS: 20.0000 mg | ORAL_TABLET | Freq: Two times a day (BID) | ORAL | 0 refills | Status: AC

## 2023-05-14 NOTE — Progress Notes (Signed)
 Virtual Visit Consent   Annette Parks, you are scheduled for a virtual visit with a [facility] provider today. Just as with appointments in the office, your consent must be obtained to participate. Your consent will be active for this visit and any virtual visit you may have with one of our providers in the next 365 days. If you have a MyChart account, a copy of this consent can be sent to you electronically.  As this is a virtual visit, video technology does not allow for your provider to perform a traditional examination. This may limit your provider's ability to fully assess your condition. If your provider identifies any concerns that need to be evaluated in person or the need to arrange testing (such as labs, EKG, etc.), we will make arrangements to do so. Although advances in technology are sophisticated, we cannot ensure that it will always work on either your end or our end. If the connection with a video visit is poor, the visit may have to be switched to a telephone visit. With either a video or telephone visit, we are not always able to ensure that we have a secure connection.  By engaging in this virtual visit, you consent to the provision of healthcare and authorize for your insurance to be billed (if applicable) for the services provided during this visit. Depending on your insurance coverage, you may receive a charge related to this service.  I need to obtain your verbal consent now. Are you willing to proceed with your visit today? Annette Parks has provided verbal consent on 05/14/2023 for a virtual visit (video or telephone). Mardene Shake, FNP  Date: 05/14/2023 6:04 PM  Virtual Visit via Video Note   I, Mardene Shake, connected with  Annette Parks  ([id number], Jul 03, 1990) on 05/14/23 at  6:15 PM EST by a video-enabled telemedicine application and verified that I am speaking with the correct person using two identifiers.  Location: Patient: Virtual Visit Location Patient:  Home Provider: Virtual Visit Location Provider: Home Office   I discussed the limitations of evaluation and management by telemedicine and the availability of in person appointments. The patient expressed understanding and agreed to proceed.    History of Present Illness: Annette Parks is a 33 y.o. who identifies as a female who was assigned female at birth, and is being seen today for follow up from an EV over the past weekend.   She has had ongoing sinus drainage and pressure  Has been using flonase  and now has a barking cough as well   She feels anytime she does any activity she has a coughing fit   Today is one week since onset of symptoms   No fever   She has a 33 month old that is also sick  She is breastfeeding   She has not had asthma in the past   She is using Robitussin and tylenol  for relief  Has a headache from coughing as well   Problems:  Patient Active Problem List   Diagnosis Date Noted   Pregnancy 10/25/2022   Palpitations 01/18/2020   Closed fracture of fifth metacarpal bone of right hand 11/05/2019   Pain of right shoulder joint on movement 10/26/2019   Acute pain of right wrist 10/26/2019   Hashimoto's thyroiditis 09/04/2018   Hair loss 09/04/2018   Conductive hearing loss of right ear with unrestricted hearing of left ear 09/26/2017   Perforation of right tympanic membrane 09/26/2017   Hip pain, chronic, left  08/13/2017   Lower back pain 08/11/2017   Trochanteric bursitis of left hip 07/31/2017   Sacroiliac (ligament) sprain 07/31/2017   Common migraine 05/07/2016   Eye pain, bilateral 05/07/2016   Facial pain 05/07/2016   GAD (generalized anxiety disorder) 03/22/2015   Neck pain 07/11/2014   Myalgia 07/11/2014   Dysesthesia 07/11/2014    Allergies: No Known Allergies Medications:  Current Outpatient Medications:    amoxicillin -clavulanate (AUGMENTIN ) 875-125 MG tablet, Take 1 tablet by mouth every 12 (twelve) hours., Disp: 14 tablet, Rfl: 0    cetirizine  (ZYRTEC  ALLERGY) 10 MG tablet, Take 1 tablet (10 mg total) by mouth daily., Disp: 30 tablet, Rfl: 2   fluticasone  (FLONASE ) 50 MCG/ACT nasal spray, Place 2 sprays into both nostrils daily., Disp: 16 g, Rfl: 6   predniSONE  (DELTASONE ) 50 MG tablet, Take 1 tab p.o. daily for 5 days., Disp: 5 tablet, Rfl: 0   Prenatal Vit-Fe Fumarate-FA (PRENATAL VITAMIN PO), , Disp: , Rfl:    VITAMIN D  PO, , Disp: , Rfl:   Observations/Objective: Patient is well-developed, well-nourished in no acute distress.  Resting comfortably  at home.  Head is normocephalic, atraumatic.  No labored breathing.  Speech is clear and coherent with logical content.  Patient is alert and oriented at baseline.    Assessment and Plan:  1. Acute non-recurrent pansinusitis (Primary) Take with food   - amoxicillin -clavulanate (AUGMENTIN ) 875-125 MG tablet; Take 1 tablet by mouth 2 (two) times daily for 7 days.  Dispense: 14 tablet; Refill: 0  2. Bronchitis Take with food  May continue Robitussin OTC for additional relief   - predniSONE  (DELTASONE ) 20 MG tablet; Take 1 tablet (20 mg total) by mouth 2 (two) times daily with a meal for 5 days. Avoid breastfeeding for 4 hours after each dose. Take with food  Dispense: 10 tablet; Refill: 0     Follow Up Instructions: I discussed the assessment and treatment plan with the patient. The patient was provided an opportunity to ask questions and all were answered. The patient agreed with the plan and demonstrated an understanding of the instructions.  A copy of instructions were sent to the patient via MyChart unless otherwise noted below.    The patient was advised to call back or seek an in-person evaluation if the symptoms worsen or if the condition fails to improve as anticipated.    Mardene Shake, FNP

## 2023-05-15 ENCOUNTER — Ambulatory Visit: Payer: Commercial Managed Care - PPO

## 2023-05-15 ENCOUNTER — Other Ambulatory Visit: Payer: Self-pay

## 2023-05-15 ENCOUNTER — Ambulatory Visit
Admission: RE | Admit: 2023-05-15 | Discharge: 2023-05-15 | Disposition: A | Discharge status: Home / Self Care | Payer: Commercial Managed Care - PPO | Source: Ambulatory Visit | Attending: Family Medicine | Admitting: Family Medicine

## 2023-05-15 VITALS — BP 136/86 | HR 151 | Temp 101.0°F | Resp 20

## 2023-05-15 DIAGNOSIS — R509 Fever, unspecified: Secondary | ICD-10-CM

## 2023-05-15 DIAGNOSIS — J111 Influenza due to unidentified influenza virus with other respiratory manifestations: Secondary | ICD-10-CM

## 2023-05-15 DIAGNOSIS — R350 Frequency of micturition: Secondary | ICD-10-CM

## 2023-05-15 DIAGNOSIS — R059 Cough, unspecified: Secondary | ICD-10-CM | POA: Diagnosis not present

## 2023-05-15 LAB — POCT INFLUENZA A/B
Influenza A, POC: NEGATIVE
Influenza B, POC: NEGATIVE

## 2023-05-15 LAB — POC SARS CORONAVIRUS 2 AG -  ED: SARS Coronavirus 2 Ag: NEGATIVE

## 2023-05-15 MED ORDER — BENZONATATE 200 MG PO CAPS
200.0000 mg | ORAL_CAPSULE | Freq: Three times a day (TID) | ORAL | 0 refills | Status: AC | PRN
Start: 2023-05-15 — End: 2023-05-22

## 2023-05-15 MED ORDER — ACETAMINOPHEN 500 MG PO TABS
1000.0000 mg | ORAL_TABLET | Freq: Once | ORAL | Status: AC
  Administered 2023-05-15: 1000 mg via ORAL

## 2023-05-15 MED ORDER — PREDNISONE 20 MG PO TABS
ORAL_TABLET | ORAL | 0 refills | Status: DC
Start: 2023-05-15 — End: 2023-08-22

## 2023-05-15 MED ORDER — PROMETHAZINE-DM 6.25-15 MG/5ML PO SYRP: 5.0000 mL | ORAL_SOLUTION | Freq: Two times a day (BID) | ORAL | 0 refills | Status: DC | PRN

## 2023-05-15 NOTE — ED Provider Notes (Signed)
[facility]    CSN: [id number] Arrival date & time: 05/15/23  0806      History   Chief Complaint Chief Complaint  Patient presents with   URI    HPI Annette Parks is a 33 y.o. female.   HPI 33 year old female presents with flulike illness, complains of bodyaches, chills, fevers, chest congestion, cough since Friday.  Reports her 62-month-old child sick with similar symptoms.  Patient reports was evaluated via e-visit and prescribed Augmentin and prednisone but she is concerned more for flu and/or COVID-19.  PMH significant for Hashimoto's thyroiditis, bilateral eye pain, and myalgia.  Patient reports she is just starting Augmentin twice daily x 7 days and prednisone 20 mg daily for 5 days.  Patient has been advised to hold breastmilk/breast-feeding for 4 hours after prednisone dose.  Past Medical History:  Diagnosis Date   Anal fissure    Hashimoto's disease    IBS (irritable bowel syndrome)    Migraine headache    Pelvic floor dysfunction?    Perforated eardrum    PONV (postoperative nausea and vomiting)    Pregnancy induced hypertension    UTI (urinary tract infection)    Vaginal Pap smear, abnormal     Patient Active Problem List   Diagnosis Date Noted   Pregnancy 10/25/2022   Palpitations 01/18/2020   Closed fracture of fifth metacarpal bone of right hand 11/05/2019   Pain of right shoulder joint on movement 10/26/2019   Acute pain of right wrist 10/26/2019   Hashimoto's thyroiditis 09/04/2018   Hair loss 09/04/2018   Conductive hearing loss of right ear with unrestricted hearing of left ear 09/26/2017   Perforation of right tympanic membrane 09/26/2017   Hip pain, chronic, left 08/13/2017   Lower back pain 08/11/2017   Trochanteric bursitis of left hip 07/31/2017   Sacroiliac (ligament) sprain 07/31/2017   Common migraine 05/07/2016   Eye pain, bilateral 05/07/2016   Facial pain 05/07/2016   GAD (generalized anxiety disorder) 03/22/2015   Neck  pain 07/11/2014   Myalgia 07/11/2014   Dysesthesia 07/11/2014    Past Surgical History:  Procedure Laterality Date   DILATION AND EVACUATION  09/06/2021   EAR TUBE REMOVAL     TONSILLECTOMY AND ADENOIDECTOMY     TYMPANOPLASTY Right    TYMPANOSTOMY TUBE PLACEMENT     x 3   WISDOM TOOTH EXTRACTION      OB History     Gravida  3   Para  1   Term  1   Preterm      AB  2   Living  1      SAB  2   IAB      Ectopic      Multiple  0   Live Births  1            Home Medications    Prior to Admission medications   Medication Sig Start Date End Date Taking? Authorizing Provider  benzonatate (TESSALON) 200 MG capsule Take 1 capsule (200 mg total) by mouth 3 (three) times daily as needed for up to 7 days. 05/15/23 05/22/23 Yes Trevor Iha, FNP  predniSONE (DELTASONE) 20 MG tablet Take 2 tabs PO daily x 5 days. 05/15/23  Yes Trevor Iha, FNP  promethazine-dextromethorphan (PROMETHAZINE-DM) 6.25-15 MG/5ML syrup Take 5 mLs by mouth 2 (two) times daily as needed for cough. 05/15/23  Yes Trevor Iha, FNP  amoxicillin-clavulanate (AUGMENTIN) 875-125 MG tablet Take 1 tablet by mouth 2 (two) times  daily for 7 days. 05/14/23 05/21/23  Viviano Simas, FNP  cetirizine (ZYRTEC ALLERGY) 10 MG tablet Take 1 tablet (10 mg total) by mouth daily. 05/11/23   Junie Spencer, FNP  fluticasone (FLONASE) 50 MCG/ACT nasal spray Place 2 sprays into both nostrils daily. 05/11/23   Junie Spencer, FNP  predniSONE (DELTASONE) 20 MG tablet Take 1 tablet (20 mg total) by mouth 2 (two) times daily with a meal for 5 days. Avoid breastfeeding for 4 hours after each dose. Take with food 05/14/23 05/19/23  Viviano Simas, FNP  Prenatal Vit-Fe Fumarate-FA (PRENATAL VITAMIN PO)     [provider]  VITAMIN D PO     [provider]    Family History Family History  Problem Relation Age of Onset   Migraines Mother    Hashimoto's thyroiditis Mother    Endometriosis Mother     Hyperlipidemia Father    Diverticulitis Father        had to have part of colon removed   Asthma Brother    Diabetes Maternal Grandmother     Social History Social History   Tobacco Use   Smoking status: Never   Smokeless tobacco: Never  Vaping Use   Vaping status: Never Used  Substance Use Topics   Alcohol use: Not Currently    Comment: occasional-2 per month   Drug use: No     Allergies   Patient has no known allergies.   Review of Systems Review of Systems  Constitutional:  Positive for chills.  Respiratory:  Positive for cough and chest tightness.   Musculoskeletal:  Positive for arthralgias and myalgias.  All other systems reviewed and are negative.    Physical Exam Triage Vital Signs ED Triage Vitals  Encounter Vitals Group     BP 05/15/23 0821 136/86     Systolic BP Percentile --      Diastolic BP Percentile --      Pulse Rate 05/15/23 0821 (!) 151     Resp 05/15/23 0821 20     Temp 05/15/23 0821 (!) 101 F (38.3 C)     Temp src --      SpO2 05/15/23 0821 98 %     Weight --      Height --      Head Circumference --      Peak Flow --      Pain Score 05/15/23 0818 6     Pain Loc --      Pain Education --      Exclude from Growth Chart --    No data found.  Updated Vital Signs BP 136/86   Pulse (!) 151   Temp (!) 101 F (38.3 C)   Resp 20   SpO2 98%   Breastfeeding Yes      Physical Exam Vitals and nursing note reviewed.  Constitutional:      Appearance: Normal appearance. She is normal weight. She is ill-appearing.  HENT:     Head: Normocephalic and atraumatic.     Right Ear: Tympanic membrane, ear canal and external ear normal.     Left Ear: Tympanic membrane, ear canal and external ear normal.     Mouth/Throat:     Mouth: Mucous membranes are moist.     Pharynx: Oropharynx is clear.  Eyes:     Extraocular Movements: Extraocular movements intact.     Conjunctiva/sclera: Conjunctivae normal.     Pupils: Pupils are equal, round,  and reactive to light.  Cardiovascular:  Rate and Rhythm: Normal rate and regular rhythm.     Pulses: Normal pulses.     Heart sounds: Normal heart sounds. No murmur heard. Pulmonary:     Effort: Pulmonary effort is normal.     Breath sounds: No wheezing, rhonchi or rales.     Comments: Diminished breath sounds noted throughout with infrequent nonproductive cough Musculoskeletal:        General: Normal range of motion.     Cervical back: Normal range of motion and neck supple.  Skin:    General: Skin is warm and dry.  Neurological:     General: No focal deficit present.     Mental Status: She is alert and oriented to person, place, and time. Mental status is at baseline.  Psychiatric:        Mood and Affect: Mood normal.        Behavior: Behavior normal.      UC Treatments / Results  Labs (all labs ordered are listed, but only abnormal results are displayed) Labs Reviewed  POCT INFLUENZA A/B  POC SARS CORONAVIRUS 2 AG -  ED    EKG   Radiology DG Chest 2 View Result Date: 05/15/2023 CLINICAL DATA:  Cough for 7 days. EXAM: CHEST - 2 VIEW COMPARISON:  None Available. FINDINGS: The heart size and mediastinal contours are within normal limits. Both lungs are clear. The visualized skeletal structures are unremarkable. IMPRESSION: No active cardiopulmonary disease. Electronically Signed   By: Lupita Raider M.D.   On: 05/15/2023 09:58    Procedures Procedures (including critical care time)  Medications Ordered in UC Medications  acetaminophen (TYLENOL) tablet 1,000 mg (1,000 mg Oral Given 05/15/23 0830)    Initial Impression / Assessment and Plan / UC Course  I have reviewed the triage vital signs and the nursing notes.  Pertinent labs & imaging results that were available during my care of the patient were reviewed by me and considered in my medical decision making (see chart for details).     MDM: 1.  Influenza-like illness-influenza and COVID-19 both negative; 2.   Cough, unspecified type-Rx'd prednisone 20 mg tablet: Take 2 tablets p.o. daily x 5 days, Rx'd Tessalon 200 mg capsule: Take 1 capsule 3 times daily, as needed for cough, Rx'd Promethazine DM 6.25-15 mg/5 mL syrup: Take 5 mL twice daily, as needed for cough; 3.  Fever, unspecified-Instructed patient to take medication as previously prescribed by e-visit.  Advised we will add prednisone 40 mg daily.  Advised patient may take Tessalon capsules daily or as needed for cough.  Advised may take Promethazine DM at night prior to sleep for cough due to sedative effects.  Advised patient may take 1 g of OTC Tylenol every 6 hours for fever (oral temperature greater than 100.3).  Encouraged to increase daily water intake to 64 ounces per day while taking these medications.  Advised if symptoms worsen and/or unresolved please follow-up PCP or here for further evaluation.  Patient discharged home, hemodynamically stable. Final Clinical Impressions(s) / UC Diagnoses   Final diagnoses:  Influenza-like illness  Cough, unspecified type  Fever, unspecified     Discharge Instructions      Instructed patient to take medication as previously prescribed by e-visit.  Advised we will add prednisone 40 mg daily.  Advised patient may take Tessalon capsules daily or as needed for cough.  Advised may take Promethazine DM at night prior to sleep for cough due to sedative effects.  Advised patient may take  1 g of OTC Tylenol every 6 hours for fever (oral temperature greater than 100.3).  Encouraged to increase daily water intake to 64 ounces per day while taking these medications.  Advised if symptoms worsen and/or unresolved please follow-up PCP or here for further evaluation.     ED Prescriptions     Medication Sig Dispense Auth. Provider   predniSONE (DELTASONE) 20 MG tablet Take 2 tabs PO daily x 5 days. 10 tablet Trevor Iha, FNP   promethazine-dextromethorphan (PROMETHAZINE-DM) 6.25-15 MG/5ML syrup Take 5 mLs by  mouth 2 (two) times daily as needed for cough. 118 mL Trevor Iha, FNP   benzonatate (TESSALON) 200 MG capsule Take 1 capsule (200 mg total) by mouth 3 (three) times daily as needed for up to 7 days. 40 capsule Trevor Iha, FNP      PDMP not reviewed this encounter.   Trevor Iha, FNP 05/15/23 1031

## 2023-05-15 NOTE — ED Triage Notes (Signed)
Pt presents to uc with co of body aches and chills, fevers, and chest pain, cough (yellow production), congestion since Friday with worsening since Saturday.  At home covid neg. Pt reports her 22 month old is sick with similar symptoms.  Pt was seen via e-vist and was rx augmentin and prednisone but she is concerned for flu. Pt reports otc tylenol, motrin, musinex, and vicks vapor rub

## 2023-05-15 NOTE — Discharge Instructions (Addendum)
Instructed patient to take medication as previously prescribed by e-visit.  Advised we will add prednisone 40 mg daily.  Advised patient may take Tessalon capsules daily or as needed for cough.  Advised may take Promethazine DM at night prior to sleep for cough due to sedative effects.  Advised patient may take 1 g of OTC Tylenol every 6 hours for fever (oral temperature greater than 100.3).  Encouraged to increase daily water intake to 64 ounces per day while taking these medications.  Advised if symptoms worsen and/or unresolved please follow-up PCP or here for further evaluation.

## 2023-05-16 ENCOUNTER — Telehealth (INDEPENDENT_AMBULATORY_CARE_PROVIDER_SITE_OTHER): Payer: Commercial Managed Care - PPO | Admitting: Internal Medicine

## 2023-05-16 ENCOUNTER — Encounter: Payer: Commercial Managed Care - PPO | Admitting: Rehabilitative and Restorative Service Providers"

## 2023-05-16 ENCOUNTER — Encounter: Payer: Self-pay | Admitting: Internal Medicine

## 2023-05-16 DIAGNOSIS — E0500 Thyrotoxicosis with diffuse goiter without thyrotoxic crisis or storm: Secondary | ICD-10-CM | POA: Diagnosis not present

## 2023-05-16 DIAGNOSIS — E063 Autoimmune thyroiditis: Secondary | ICD-10-CM | POA: Diagnosis not present

## 2023-05-16 NOTE — Patient Instructions (Signed)
Please come back for labs in 3 to 4 weeks.  Please return in 3 months.

## 2023-05-16 NOTE — Progress Notes (Signed)
Patient ID: [name], female   DOB: November 30, 1990, 33 y.o.   MRN: [id number]   Patient location: Home My location: Office Persons participating in the virtual visit: patient, provider  Referring Provider: Pearline Cables, MD  I connected with the patient on 05/16/23 at 11:14 AM EST by a video enabled telemedicine application and verified that I am speaking with the correct person.   I discussed the limitations of evaluation and management by telemedicine and the availability of in person appointments. The patient expressed understanding and agreed to proceed.   Details of the encounter are shown below.  HPI  Annette Parks is a 33 y.o.-year-old very pleasant female, initially referred by her PCP, Dr. Dallas Parks, returning for follow-up for Hashimoto's thyroiditis.  Last visit 2 months ago.  This appointment was changed to virtual due to upper respiratory infection. Her mother, Annette Parks, is also my patient.  Interim hx: She had had COVID 19 in 12/2022 after going back to work.  She then had gastroenteritis.   She had wrist sx in 02/2023. Last month she was on Prednisone for 5 days for sinusitis. She currently has a URI (negative for flu A and B and COVID-19).  She had 102 F fever.  On ABx and Prednisone. She is weaning breast feeding.  As of now she is breast-feeding in the morning, during the day, and at bedtime, but not during the night. She has anxiety especially related to her recent medical problems. She still has occasional palpitations but did not notice an increase recently.  Reviewed history: She was diagnosed with Hashimoto's thyroiditis in 07/2018.  She has normal thyroid tests so she did not require levothyroxine.  However, in 02/2023 she had thyrotoxic thyroid tests and also retrospectively hair loss, palpitations, anxiety, excessive weight loss, fatigue, mood swings, and increase sensitivity to light.  No tremors.  Reviewed her TFTs: Lab Results  Component Value Date    TSH 0.00 (L) 03/21/2023   TSH 1.96 12/11/2022   TSH 2.67 01/09/2022   TSH 1.96 09/18/2021   TSH 1.60 09/18/2020   TSH 1.67 12/27/2019   TSH 1.35 09/13/2019   TSH 1.58 03/08/2019   TSH 1.66 08/20/2018   TSH 1.73 08/05/2016   FREET4 1.86 (H) 03/21/2023   FREET4 0.96 09/18/2021   FREET4 0.90 09/18/2020   FREET4 0.80 09/13/2019   FREET4 1.02 03/08/2019  03/14/2023:   Her thyroid antibodies were elevated: Lab Results  Component Value Date   TSI 316 (H) 03/21/2023     Component     Latest Ref Rng & Units 03/22/2015  Thyroperoxidase Ab SerPl-aCnc     <9 IU/mL 2  We started selenium 200 mcg daily in 08/2018, but her TPO antibodies were still elevated at last check so we stopped it.  Thyroid uptake and scan (03/21/2023): Uniform uptake within enlarged thyroid gland. Pyramidal lobe evident.  4 hour I-123 uptake = 8.8% (normal 5-20%) 24 hour I-123 uptake = 17.2% (normal 10-30%)   IMPRESSION: 1. Uniform uptake in enlarged gland. Foraminal pyramidal lobe. No nodularity. 2. Iodine uptake within normal limits.  She had an ectopic pregnancy 11/2020 for which she had to get methotrexate.  Before last visit, she had a miscarriage at 23 weeks,for which she had to have a D and C.  Afterwards, she had a healthy pregnancy and gave birth to a healthy baby boy (Annette Parks) 10/25/2022.  Pt denies: - feeling nodules in neck - hoarseness - dysphagia - choking  She has + FH of  thyroid disorders in: mother, MGM. No FH of thyroid cancer. No h/o radiation tx to head or neck. No herbal supplements. No Biotin use now. No recent steroids use.   Pt. also has a history of Raynauds phenomenon. She was on OCPs - since a teenager. She tried to stop x 8 mo restarted b/c acne and also dysmenorrhea.  She also has a history of heart valve insufficiency. She has neck, lower back pain, hip pain; she was diagnosed with spina bifida occulta. She has constipation (chronic) and anal fissures. She has  hyperlipidemia. She was on Amitriptyline for bladder dysfxn.  Previously had investigation for palpitations and was found to have PVCs, by cardiology.    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  ROS: + See HPI  I reviewed pt's medications, allergies, PMH, social hx, family hx, and changes were documented in the history of present illness. Otherwise, unchanged from my initial visit note.  Past Medical History:  Diagnosis Date   Anal fissure    Hashimoto's disease    IBS (irritable bowel syndrome)    Migraine headache    Pelvic floor dysfunction?    Perforated eardrum    PONV (postoperative nausea and vomiting)    Pregnancy induced hypertension    UTI (urinary tract infection)    Vaginal Pap smear, abnormal    Past Surgical History:  Procedure Laterality Date   DILATION AND EVACUATION  09/06/2021   EAR TUBE REMOVAL     TONSILLECTOMY AND ADENOIDECTOMY     TYMPANOPLASTY Right    TYMPANOSTOMY TUBE PLACEMENT     x 3   WISDOM TOOTH EXTRACTION     Social History   Socioeconomic History   Marital status: Married    Spouse name: Not on file   Number of children: 0   Years of education: Not on file   Highest education level: Not on file  Occupational History   Occupation: Registered nurse  Tobacco Use   Smoking status: Never   Smokeless tobacco: Never  Vaping Use   Vaping status: Never Used  Substance and Sexual Activity   Alcohol use: Not Currently    Comment: occasional-2 per month   Drug use: No   Sexual activity: Not Currently  Other Topics Concern   Not on file  Social History Narrative   Married with 1 child     moved here from [place] went to [employer]. Moved here after her parents moved here.   Clinic nurse in [employer]   Social Drivers of Health   Financial Resource Strain: Not on file  Food Insecurity: No Food Insecurity (10/25/2022)   Hunger Vital Sign    Worried About Running Out of Food in the Last Year: Never  true    Ran Out of Food in the Last Year: Never true  Transportation Needs: No Transportation Needs (10/25/2022)   PRAPARE - Administrator, Civil Service (Medical): No    Lack of Transportation (Non-Medical): No  Physical Activity: Not on file  Stress: Not on file  Social Connections: Not on file  Intimate Partner Violence: Not At Risk (10/25/2022)   Humiliation, Afraid, Rape, and Kick questionnaire    Fear of Current or Ex-Partner: No    Emotionally Abused: No    Physically Abused: No    Sexually Abused: No   Current Outpatient Medications on File Prior to Visit  Medication Sig Dispense Refill   amoxicillin-clavulanate (AUGMENTIN) 875-125 MG tablet Take 1 tablet by mouth 2 (  two) times daily for 7 days. 14 tablet 0   benzonatate (TESSALON) 200 MG capsule Take 1 capsule (200 mg total) by mouth 3 (three) times daily as needed for up to 7 days. 40 capsule 0   cetirizine (ZYRTEC ALLERGY) 10 MG tablet Take 1 tablet (10 mg total) by mouth daily. 30 tablet 2   fluticasone (FLONASE) 50 MCG/ACT nasal spray Place 2 sprays into both nostrils daily. 16 g 6   predniSONE (DELTASONE) 20 MG tablet Take 1 tablet (20 mg total) by mouth 2 (two) times daily with a meal for 5 days. Avoid breastfeeding for 4 hours after each dose. Take with food 10 tablet 0   predniSONE (DELTASONE) 20 MG tablet Take 2 tabs PO daily x 5 days. 10 tablet 0   Prenatal Vit-Fe Fumarate-FA (PRENATAL VITAMIN PO)      promethazine-dextromethorphan (PROMETHAZINE-DM) 6.25-15 MG/5ML syrup Take 5 mLs by mouth 2 (two) times daily as needed for cough. 118 mL 0   VITAMIN D PO      No current facility-administered medications on file prior to visit.   No Known Allergies Family History  Problem Relation Age of Onset   Migraines Mother    Hashimoto's thyroiditis Mother    Endometriosis Mother    Hyperlipidemia Father    Diverticulitis Father        had to have part of colon removed   Asthma Brother    Diabetes Maternal  Grandmother    PE: 132 lbs now There were no vitals taken for this visit. Wt Readings from Last 15 Encounters:  03/21/23 136 lb 6.4 oz (61.9 kg)  01/13/23 145 lb 6.4 oz (66 kg)  10/25/22 183 lb 12.8 oz (83.4 kg)  09/24/22 178 lb (80.7 kg)  08/18/22 170 lb 1.6 oz (77.2 kg)  05/06/22 144 lb 6.4 oz (65.5 kg)  01/09/22 144 lb 3.2 oz (65.4 kg)  10/01/21 140 lb (63.5 kg)  09/18/21 141 lb 12.8 oz (64.3 kg)  01/08/21 132 lb (59.9 kg)  01/05/21 132 lb 8 oz (60.1 kg)  09/18/20 130 lb (59 kg)  07/11/20 129 lb (58.5 kg)  06/27/20 129 lb (58.5 kg)  04/04/20 129 lb 6.4 oz (58.7 kg)   Constitutional:  in NAD  The physical exam was not performed (virtual visit).  ASSESSMENT: 1. Hashimoto thyroiditis - in pregnancy  2.  Thyrotoxicosis  PLAN: 1. And 2.  Patient with history of Hashimoto's thyroiditis for which she did not require levothyroxine.  She did well during the last pregnancy, without the need for levothyroxine.  She had previously had an ectopic pregnancy in 2023 and then a miscarriage, but she gave birth to a healthy boy and she was breast-feeding at last visit.  TFTs were excellent 11/2022, approximately 1.5 months after giving birth. However, in 02/2023, she had an undetectable TSH along with a high total T4.  She had recent onset of thyrotoxic symptoms: Hair loss, heart racing, anxiety, restlessness, excessive weight loss (approximately 50 pounds since delivery), fatigue, mood swings, and sensitivity to light. When I saw her in clinic, we discussed about the differential diagnosis for her newly developed thyrotoxicosis to include viral subacute thyroiditis (possibly due to COVID-19 infection, which she had in 12/2022 after returning to work), postpartum thyroiditis, thyrotoxic episode of Hashimoto's thyroiditis, or Graves' disease.  At last visit we checked her TFTs and they were improved, with a detectable, although very low TSH is still elevated free T4 and free T3 but within 1.5X  about the upper  limit of normal.  Her TSI's and TPO antibodies were elevated.  I sent her for a thyroid uptake and scan and the uptake was normal, with a uniformed scan and the detectable pyramidal lobe, confirming mild Graves' disease. We discussed about methimazole and I also suggested a beta-blocker to help with the palpitations along with anxiety but she was breast-feeding and wanted to hold off starting these.  I did advise her to avoid exercise, reduce stress but if the heart rate was not improving to try to start the low-dose beta-blocker at least. -At today's visit, she is feeling poorly, with an upper respiratory infection.  She had several health problems since last visit including wrist surgery in the dominant hand for which she was out of work.  She then had sinusitis and had to take steroids.  Currently she has a severe URI for which she was just started on antibiotics and prednisone.  She mentions that her son is in daycare and he was also sick. -Since last visit, she did not have a repeat set of thyroid tests and we will check these in 3 to 4 weeks.  She now has a URI and is on prednisone so I did not recommend to come to the clinic right away. -We discussed about the possible evolution of her Graves'/Hashimoto's disease.  She may have goal towards euthyroidism but hypothyroidism is also possible.  Will need to keep a close eye on her thyroid tests going forward.  Orders Placed This Encounter  Procedures   TSH   T4, free   T3, free   Carlus Pavlov, MD PhD [facility]

## 2023-05-21 NOTE — Therapy (Signed)
OUTPATIENT OCCUPATIONAL THERAPY TREATMENT AND DISCHARGE NOTE  Patient Name: Annette Parks MRN: [id number] DOB:June 14, 1990, 33 y.o., female Today's Date: 05/23/2023  PCP: Annette Amsterdam, MD REFERRING PROVIDER: Samuella Cota, MD      OCCUPATIONAL THERAPY DISCHARGE SUMMARY  Visits from Start of Care: 5  Current functional level related to goals / functional outcomes: 05/23/2023: She has now met most of her long-term goals and states understanding of plan to continue to work towards meeting all of her goals and achieving 100% of her "normal function."  She is very happy with her progress and has no more significant pain or problems.  Agrees to discharge today  Education / Equipment: Pt has all needed materials and education. Pt understands how to continue on with self-management. See tx notes for more details.   Patient agrees to discharge due to max benefits received from outpatient occupational therapy / hand therapy at this time.   [name], OTR/L, CHT 05/23/23        END OF SESSION:  OT End of Session - 05/23/23 0947     Visit Number 5    Number of Visits 12    Date for OT Re-Evaluation 06/13/23    Authorization Type [facility]    OT Start Time [phone]    OT Stop Time 1012    OT Time Calculation (min) 25 min    Activity Tolerance Patient tolerated treatment well;No increased pain    Behavior During Therapy WFL for tasks assessed/performed             Past Medical History:  Diagnosis Date   Anal fissure    Hashimoto's disease    IBS (irritable bowel syndrome)    Migraine headache    Pelvic floor dysfunction?    Perforated eardrum    PONV (postoperative nausea and vomiting)    Pregnancy induced hypertension    UTI (urinary tract infection)    Vaginal Pap smear, abnormal    Past Surgical History:  Procedure Laterality Date   DILATION AND EVACUATION  09/06/2021   EAR TUBE REMOVAL     TONSILLECTOMY AND ADENOIDECTOMY     TYMPANOPLASTY Right     TYMPANOSTOMY TUBE PLACEMENT     x 3   WISDOM TOOTH EXTRACTION     Patient Active Problem List   Diagnosis Date Noted   Pregnancy 10/25/2022   Palpitations 01/18/2020   Closed fracture of fifth metacarpal bone of right hand 11/05/2019   Pain of right shoulder joint on movement 10/26/2019   Acute pain of right wrist 10/26/2019   Hashimoto's thyroiditis 09/04/2018   Hair loss 09/04/2018   Conductive hearing loss of right ear with unrestricted hearing of left ear 09/26/2017   Perforation of right tympanic membrane 09/26/2017   Hip pain, chronic, left 08/13/2017   Lower back pain 08/11/2017   Trochanteric bursitis of left hip 07/31/2017   Sacroiliac (ligament) sprain 07/31/2017   Common migraine 05/07/2016   Eye pain, bilateral 05/07/2016   Facial pain 05/07/2016   GAD (generalized anxiety disorder) 03/22/2015   Neck pain 07/11/2014   Myalgia 07/11/2014   Dysesthesia 07/11/2014    ONSET DATE: DOS 04/10/23  REFERRING DIAG: M65.4 (ICD-10-CM) - Tendinitis, de Quervain's   THERAPY DIAG:  Localized edema  Muscle weakness (generalized)  Other lack of coordination  Pain in left hand  Stiffness of left hand, not elsewhere classified  Stiffness of left wrist, not elsewhere classified  Rationale for Evaluation and Treatment: Rehabilitation  PERTINENT HISTORY: Per referral note:"S/p de'quervains;  SPLINT and OT 04/25/23 if possible after her PO with [place]"  She states that she is a bit sore.  She states also being a little anxious and was told that she should not be moving yet in her words.  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  She is married and has a 56-month-old child from home she developed "mommy thumb," leading to tenosynovitis as well as this recent surgery.  Apparently, her capsule was a bit weak and needed a suture for repair after this typical surgery-she also needed some tenolysis due to adherent tendons.  Today she appears stiffly guarded at the shoulder, elbow,  forearm and hand-arriving in a bulky dressing.  PRECAUTIONS: None;  RED FLAGS:  None   WEIGHT BEARING RESTRICTIONS: Yes nonweightbearing in the left thumb/hand/arm for at least the next month   SUBJECTIVE:   SUBJECTIVE STATEMENT: 6 weeks post DQ release with compartment repair and DSRN lysis, as well as APL/EPB tenolysis.  She states she was sick last week and had to miss, but she has been doing well and only has some mild stiffness left in her left wrist.   PAIN:  Are you having pain?  No significant pain now  PATIENT GOALS: To improve the use of her dominant left hand and arm    OBJECTIVE: (All objective assessments below are from initial evaluation on: 04/28/2023 unless otherwise specified.)   HAND DOMINANCE: Left  ADLs: Overall ADLs: States no significant functional problems now  FUNCTIONAL OUTCOME MEASURES: 05/23/23: Quick DASH: 16%   Eval: Quick DASH 66% impairment today  (Higher % Score  =  More Impairment)     UPPER EXTREMITY ROM     Shoulder to Wrist AROM Left eval Lt 05/01/23 Lt 05/23/23  Elbow flexion 146 150- Normal   Elbow extension (-15) 0   Forearm supination 78 86   Forearm pronation  22 87   Wrist flexion 38 28 47 (82 Rt)  Wrist extension (-11) 14 59  Wrist Ulnar Deviation  12   Wrist Radial Deviation  9   (Blank rows = not tested)   Hand AROM Left eval Lt 05/07/23 Lt 05/09/23 Lt 05/23/23  Full Fist Ability (or Gap to Distal Palmar Crease) 3cm gap from tip of MF to [facility] Loose full fist Loose fist Tight fist now  Thumb Opposition  (Kapandji Scale)  0/10 5/10 8/10 10/10  Thumb MCP (0-60)      Thumb IP (0-80)      Thumb Radial Abduction Span       Thumb Palmar Abduction Span       (Blank rows = not tested)   UPPER EXTREMITY MMT:     MMT Left 05/09/23 Lt 05/23/23  Elbow flexion 5/5   Elbow extension 5/5   Forearm supination 3+/5 tender 4-/5 tender  Forearm pronation 3+/5 tender 4+/5  Wrist flexion 4+/5   Wrist extension 4-/5 slight tender  4+/5  Wrist ulnar deviation 4+/5   Wrist radial deviation 3+/5 painful 4+/5  (Blank rows = not tested)  HAND FUNCTION: 05/23/2023:  33#  05/09/23: Grip strength Left: 19 lbs   Eval: Observed weakness in affected Lt hand.  Details will be tested when safe Grip strength Right: 58 lbs, Left: NT lbs   COORDINATION: 05/23/23: 9HPT: 20 sec now   05/07/23: 9HPT: 27sec   05/01/23:  9 Hole Peg Test Left: 1 min 1 sec  sec (22 sec is WFL)   Eval: Observed coordination impairments with affected Lt  hand.  Details will be tested when appropriate  SENSATION: Eval: LT diminished around sx area and radial nerve distribution to thumb   EDEMA:   Eval:  Mildly swollen in Lt hand and wrist today  OBSERVATIONS:   Eval: She is not overly hypersensitive but she is guarded from pain and holding her arm stiffly by her side.  She is also holding her fingers stiffly as well as her thumb and has some palpable spasms in her left bicep as well as some shoulder stiffness.  She also shakes seemingly nervously or uncontrollably when out of her bulky dressing-though this stops when her eyes are closed   TODAY'S TREATMENT:  05/23/23: Pt performs AROM, gripping, and strength with Lt hand/arm against therapist's resistance for exercise/activities as well as new measures today. OT also discusses home and functional tasks with the pt and reviews goals. Using the complied data, OT also reviews home exercises and provides final recommendations and upgrades as below.  She was encouraged not to go "cold [place]" away from her stretches or strengthening program, and to manage her right wrist proactively to prevent overuse.  Pt states understanding and tolerates upgrades well.     Exercises reviewed and recommended to continue: - Wrist Flexion Stretch  - 4 x daily - 3-5 reps - 15 sec hold - Wrist Prayer Stretch  - 4 x daily - 3-5 reps - 15 sec hold - Seated Composite Thumb Flexion PROM  - 2-3 x daily - 3-5 reps - 15 sec hold -  Full Fist  - 2-3 x daily - 5 reps - "Duck Mouth" Strength  - 2-3 x daily - 5 reps - Thumb Press  - 2-3 x daily - 5 reps - Tendon Glides  - 4-6 x daily - 3-5 reps - 2-3 seconds hold - Thumb Opposition  - 4-6 x daily - 10 reps - Median Nerve Flossing  - 2-3 x daily - 5-10 reps - Standing Radial Nerve Glide  - 4-6 x daily - 1 sets - 10-15 reps   PATIENT EDUCATION: Education details: See tx section above for details  Person educated: Patient Education method: Verbal Instruction, Teach back, Handouts  Education comprehension: States and demonstrates understanding   HOME EXERCISE PROGRAM: Access Code: [email or web address] URL: [email or web address] Date: 04/28/2023 Prepared by: [name]   GOALS: Goals reviewed with patient? Yes   SHORT TERM GOALS: (STG required if POC>30 days) Target Date: 05/16/2023  Pt will obtain protective, custom orthotic. Goal status: 04/28/2023 MET though this may need adjusted in upcoming sessions  2.  Pt will demo/state understanding of initial HEP to improve pain levels and prerequisite motion. Goal status: 05/01/2023: Met   LONG TERM GOALS: Target Date: 06/13/2023  Pt will improve functional ability by decreased impairment per Quick DASH assessment from 66% to 15% or better, for better quality of life. Goal status: 05/23/23: MET 16% now  2.  Pt will improve grip strength in left hand from NTlbs to at least 40lbs for functional use at home and in IADLs. Goal status: 05/23/23: not met but significantly improved to 33#   3.  Pt will improve A/ROM in left wrist flexion/extension from 38/(-11) to at least 55 degrees each, to have functional motion for tasks like reach and grasp.  Goal status: 05/23/23: Partially MET - met in extension, flexion still a bit tight 47*  4.  Pt will improve strength in left wrist flexion/extension from NT MMT to at least 4/5 MMT to have increased functional ability to  carry out selfcare and higher-level homecare tasks  with less difficulty. Goal status: 05/23/23: MET  (though supination 4-/5 still slight tender)  5.  Pt will improve coordination skills in left hand, as seen by within functional limit score on nine-hole peg testing to have increased functional ability to carry out fine motor tasks (fasteners, etc.) and more complex, coordinated IADLs (meal prep, sports, etc.).  Goal status: 05/23/23: MET   6.  Pt will decrease pain at worst from 6/10 to 3/10 or better to have better sleep and occupational participation in daily roles. Goal status: 05/23/23: MET (1/10)   ASSESSMENT:  CLINICAL IMPRESSION: 05/23/2023: She has now met most of her long-term goals and states understanding of plan to continue to work towards meeting all of her goals and achieving 100% of her "normal function."  She is very happy with her progress and has no more significant pain or problems.  Agrees to discharge today     PLAN:  OT FREQUENCY: Discharge  OT DURATION: Discharge  PLANNED INTERVENTIONS: 97168 OT Re-evaluation, 97535 self care/ADL training, 16109 therapeutic exercise, 97530 therapeutic activity, 97112 neuromuscular re-education, 97140 manual therapy, 97035 ultrasound, 97018 paraffin, 60454 fluidotherapy, 97010 moist heat, 97010 cryotherapy, 97034 contrast bath, 97760 Orthotics management and training, 09811 Splinting (initial encounter), [id number] Subsequent splinting/medication, scar mobilization, passive range of motion, compression bandaging, Dry needling, coping strategies training, patient/family education, and DME and/or AE instructions  CONSULTED AND AGREED WITH PLAN OF CARE: Patient  PLAN FOR NEXT SESSION:   N/A/discharge   [name], OTR/L, CHT 05/23/2023, 10:17 AM

## 2023-05-23 ENCOUNTER — Ambulatory Visit: Payer: Commercial Managed Care - PPO | Admitting: Rehabilitative and Restorative Service Providers"

## 2023-05-23 ENCOUNTER — Encounter: Payer: Self-pay | Admitting: Rehabilitative and Restorative Service Providers"

## 2023-05-23 DIAGNOSIS — M25642 Stiffness of left hand, not elsewhere classified: Secondary | ICD-10-CM | POA: Diagnosis not present

## 2023-05-23 DIAGNOSIS — M79642 Pain in left hand: Secondary | ICD-10-CM

## 2023-05-23 DIAGNOSIS — F419 Anxiety disorder, unspecified: Secondary | ICD-10-CM | POA: Diagnosis not present

## 2023-05-23 DIAGNOSIS — M6281 Muscle weakness (generalized): Secondary | ICD-10-CM | POA: Diagnosis not present

## 2023-05-23 DIAGNOSIS — R278 Other lack of coordination: Secondary | ICD-10-CM | POA: Diagnosis not present

## 2023-05-23 DIAGNOSIS — R600 Localized edema: Secondary | ICD-10-CM | POA: Diagnosis not present

## 2023-05-23 DIAGNOSIS — M25632 Stiffness of left wrist, not elsewhere classified: Secondary | ICD-10-CM

## 2023-05-25 ENCOUNTER — Ambulatory Visit: Payer: Commercial Managed Care - PPO

## 2023-05-27 DIAGNOSIS — F419 Anxiety disorder, unspecified: Secondary | ICD-10-CM | POA: Diagnosis not present

## 2023-06-06 DIAGNOSIS — F419 Anxiety disorder, unspecified: Secondary | ICD-10-CM | POA: Diagnosis not present

## 2023-06-12 NOTE — Therapy (Unsigned)
OUTPATIENT PHYSICAL THERAPY FEMALE PELVIC EVALUATION   Patient Name: RADHA COGGINS MRN: [id number] DOB:1990/10/09, 33 y.o., female Today's Date: 06/13/2023  END OF SESSION:  PT End of Session - 06/13/23 1018     Visit Number 1    Date for PT Re-Evaluation 12/11/23    Authorization Type cone    PT Start Time 1015    PT Stop Time 1055    PT Time Calculation (min) 40 min    Activity Tolerance Patient tolerated treatment well    Behavior During Therapy WFL for tasks assessed/performed             Past Medical History:  Diagnosis Date   Anal fissure    Hashimoto's disease    IBS (irritable bowel syndrome)    Migraine headache    Pelvic floor dysfunction?    Perforated eardrum    PONV (postoperative nausea and vomiting)    Pregnancy induced hypertension    UTI (urinary tract infection)    Vaginal Pap smear, abnormal    Past Surgical History:  Procedure Laterality Date   DILATION AND EVACUATION  09/06/2021   EAR TUBE REMOVAL     TONSILLECTOMY AND ADENOIDECTOMY     TYMPANOPLASTY Right    TYMPANOSTOMY TUBE PLACEMENT     x 3   WISDOM TOOTH EXTRACTION     Patient Active Problem List   Diagnosis Date Noted   Pregnancy 10/25/2022   Palpitations 01/18/2020   Closed fracture of fifth metacarpal bone of right hand 11/05/2019   Pain of right shoulder joint on movement 10/26/2019   Acute pain of right wrist 10/26/2019   Hashimoto's thyroiditis 09/04/2018   Hair loss 09/04/2018   Conductive hearing loss of right ear with unrestricted hearing of left ear 09/26/2017   Perforation of right tympanic membrane 09/26/2017   Hip pain, chronic, left 08/13/2017   Lower back pain 08/11/2017   Trochanteric bursitis of left hip 07/31/2017   Sacroiliac (ligament) sprain 07/31/2017   Common migraine 05/07/2016   Eye pain, bilateral 05/07/2016   Facial pain 05/07/2016   GAD (generalized anxiety disorder) 03/22/2015   Neck pain 07/11/2014   Myalgia 07/11/2014   Dysesthesia 07/11/2014     PCP: Pearline Cables, MD  REFERRING PROVIDER: Marcelle Overlie, MD   REFERRING DIAG: R10.2 (ICD-10-CM) - Pelvic and perineal pain  THERAPY DIAG:  Muscle weakness (generalized)  Other lack of coordination  Cramp and spasm  Rationale for Evaluation and Treatment: Rehabilitation  ONSET DATE: 09/2022  SUBJECTIVE:  SUBJECTIVE STATEMENT: Vaginal birth on 10/26/22 had her son. She continues to have pain. Patient had pelvic floor therapy in the past. She has not had intercourse since her child due to fear of pain. Patient had issues on the right lumbar SI area with pregnancy. Fluid intake:   PAIN:  Are you having pain? Yes NPRS scale: 8/10 Pain location: Vaginal  Pain type: burning and tight  Pain description: intermittent   Aggravating factors: penile penetration and would last for days, walking and feels some swelling Relieving factors: no penile penetration  PRECAUTIONS: None  RED FLAGS: None   WEIGHT BEARING RESTRICTIONS: No  FALLS:  Has patient fallen in last 6 months? No  OCCUPATION: nurse  ACTIVITY LEVEL : none  PLOF: Independent  PATIENT GOALS: reduce pain for penile penetration, reduce incontinence  PERTINENT HISTORY:  Anal fissure; IBS Sexual abuse: No  BOWEL MOVEMENT: no issues Pain with bowel movement: No  URINATION: Pain with urination: No Fully empty bladder: Yes:   Stream:  strong and will angle to the left or right Urgency: No Frequency: average Leakage: Coughing, Sneezing, and Exercise Pads: Yes: wears more for sweating in the vaginal area  INTERCOURSE:  Ability to have vaginal penetration Yes.  Prior to birthing her child but has not had intercourse since then Pain with intercourse: Initial Penetration, During Penetration, Deep Penetration, and  After Intercourse DrynessNo Climax: yes Marinoff Scale: 3/3  PREGNANCY: Vaginal deliveries 1 Tearing Yes: tore in 3 spots internally and by the urethra Currently pregnant No    OBJECTIVE:  Note: Objective measures were completed at Evaluation unless otherwise noted.  DIAGNOSTIC FINDINGS:  none    COGNITION: Overall cognitive status: Within functional limits for tasks assessed     SENSATION: Light touch: Appears intact   POSTURE: No Significant postural limitations   LUMBARAROM/PROM:  A/PROM A/PROM  eval  Flexion full  Extension Decreased by 50%  Right lateral flexion Decreased by 25%  Left lateral flexion full  Right rotation Decreased by 25%  Left rotation full   (Blank rows = not tested)  LOWER EXTREMITY ROM: full bilateral hip ROM   LOWER EXTREMITY MMT:  MMT Right eval Left eval  Hip extension 4/5 4/5  Hip abduction 3/5 3/5   (Blank rows = not tested) PALPATION:   General: tenderness located in the lumbar, decreased movement of the lumbar vertebrae  Pelvic Alignment: Decreased movement of the right SI joint; Right ilium rotated anteriorly; sacrum rotated to the left  Abdominal: Good abdominal contraction                External Perineal Exam: tender in the bulbocavernosus, ischiocavernosus, perineal body, decreased mobility of the perineal body, vulvar area is pale, decreased sensation along the 11:00  to 1:00; two scars that are tender along the posterior right latera area that is very tender                             Internal Pelvic Floor: tenderness throughout the pelvic floor, decreased mobility of the posterior canal and decreased sensation.  Patient confirms identification and approves PT to assess internal pelvic floor and treatment Yes  PELVIC MMT:   MMT eval  Vaginal 3/5 but very small lift  Diastasis Recti 1 finger width below umbilicus  (Blank rows = not tested)        TONE: increased   TODAY'S TREATMENT:  DATE: 06/13/23  EVAL see below   PATIENT EDUCATION:  06/13/23 Education details: educated patient on putting her estrogen cream on the vulvar area, education on vaginal dilators and wand to improve elasticity of the tissue Person educated: Patient Education method: Explanation, Demonstration, Tactile cues, Verbal cues, and Handouts Education comprehension: verbalized understanding, returned demonstration, verbal cues required, tactile cues required, and needs further education  HOME EXERCISE PROGRAM: See above  ASSESSMENT:  CLINICAL IMPRESSION: Patient is a 33 y.o. female who was seen today for physical therapy evaluation and treatment for pelvic and perineal pain. Patient has had pelvic pain prior to her giving birth to her son vaginally 09/2022. She tore by the urethra, 2 placed internally and 2 places by the perineal body. Patient has not had penile penetration prior to giving birth to her child due to pain level 8/10. She has 2 scars on the posterior right vaginal area that are tender. She has tightness in the posterior vaginal canal. Patient has increased tenderness of the pelvic floor muscles. Patient has tenderness located on the bulbocavernosus, ischiocavernosus, and perineal body. She has decreased sensation located on the 11:00 to 1:00 and posterior vaginal canal. Right ilium is rotated anteriorly and sacrum rotated to the left. Decreased movement of the lumbar spine and weakness in the hip abductors. She leaks with coughing, sneezing, and exercise. Patient will benefit from skilled therapy to improve pelvic floor strength and coordination and reduce her pain.   OBJECTIVE IMPAIRMENTS: decreased coordination, decreased endurance, decreased ROM, decreased strength, increased fascial restrictions, increased muscle spasms, and pain.   ACTIVITY LIMITATIONS: continence, toileting, and  locomotion level  PARTICIPATION LIMITATIONS: interpersonal relationship, driving, shopping, and community activity  PERSONAL FACTORS: Time since onset of injury/illness/exacerbation are also affecting patient's functional outcome.   REHAB POTENTIAL: Excellent  CLINICAL DECISION MAKING: Evolving/moderate complexity  EVALUATION COMPLEXITY: Moderate   GOALS: Goals reviewed with patient? Yes  SHORT TERM GOALS: Target date: 07/11/23  Patient educated on how to use her cream vaginally.  Baseline: Goal status: INITIAL  2.  Patient educated on how to massage the perineal body and scars around the area to reduce pain.  Baseline:  Goal status: INITIAL  3.  Patient independent with initial  HEP for hip stretches and diaphragmatic breathing to lengthen the pelvic floor.  Baseline:  Goal status: INITIAL  4.  Patient educate on ways to manage her pelvic pain with vaginal penetration.  Baseline:  Goal status: INITIAL    LONG TERM GOALS: Target date: 12/11/23  Patient independent with advanced HEP for pelvic floor manual work and coordination.  Baseline:  Goal status: INITIAL  2.  Patient is using the vaginal dilators and on the largest one so she is able to have penile penetration vaginally.  Baseline:  Goal status: INITIAL  3.  Patient reports urinary leakage with Coughing, Sneezing, and Exercise >/= 75%. Baseline:  Goal status: INITIAL  4.  Patient is able to walk for 45 minutes without vaginal pain due reduction of trigger points.  Baseline:  Goal status: INITIAL   PLAN:  PT FREQUENCY: 1-2x/week  PT DURATION: 6 months  PLANNED INTERVENTIONS: 97110-Therapeutic exercises, 97530- Therapeutic activity, 97112- Neuromuscular re-education, 97535- Self Care, 40981- Manual therapy, [phone]- Aquatic Therapy, [id number]- Electrical stimulation (unattended), 97035- Ultrasound, Patient/Family education, Dry Needling, Joint mobilization, Spinal mobilization, Cryotherapy, Moist heat, and  Biofeedback  PLAN FOR NEXT SESSION: manual work externally to the pelvic floor, diaphragmatic breathing to relax the pelvic floor, hip stretches, mobilization to lumbar, mobilization to SI  joint   Eulis Foster, PT 06/13/23 12:31 PM

## 2023-06-13 ENCOUNTER — Other Ambulatory Visit: Payer: Self-pay

## 2023-06-13 ENCOUNTER — Encounter: Payer: Self-pay | Admitting: Physical Therapy

## 2023-06-13 ENCOUNTER — Ambulatory Visit: Payer: Commercial Managed Care - PPO | Attending: Obstetrics and Gynecology | Admitting: Physical Therapy

## 2023-06-13 ENCOUNTER — Other Ambulatory Visit: Payer: Commercial Managed Care - PPO

## 2023-06-13 DIAGNOSIS — R278 Other lack of coordination: Secondary | ICD-10-CM | POA: Insufficient documentation

## 2023-06-13 DIAGNOSIS — M6281 Muscle weakness (generalized): Secondary | ICD-10-CM | POA: Insufficient documentation

## 2023-06-13 DIAGNOSIS — R252 Cramp and spasm: Secondary | ICD-10-CM | POA: Diagnosis not present

## 2023-06-13 DIAGNOSIS — E0500 Thyrotoxicosis with diffuse goiter without thyrotoxic crisis or storm: Secondary | ICD-10-CM | POA: Diagnosis not present

## 2023-06-14 LAB — TSH: TSH: 1.11 m[IU]/L

## 2023-06-14 LAB — T4, FREE: Free T4: 1.2 ng/dL (ref 0.8–1.8)

## 2023-06-14 LAB — T3, FREE: T3, Free: 3.6 pg/mL (ref 2.3–4.2)

## 2023-06-16 ENCOUNTER — Encounter: Payer: Self-pay | Admitting: Internal Medicine

## 2023-06-17 ENCOUNTER — Encounter: Payer: Self-pay | Admitting: Orthopedic Surgery

## 2023-06-17 DIAGNOSIS — Z124 Encounter for screening for malignant neoplasm of cervix: Secondary | ICD-10-CM | POA: Diagnosis not present

## 2023-06-17 DIAGNOSIS — Z6822 Body mass index (BMI) 22.0-22.9, adult: Secondary | ICD-10-CM | POA: Diagnosis not present

## 2023-06-17 DIAGNOSIS — Z01419 Encounter for gynecological examination (general) (routine) without abnormal findings: Secondary | ICD-10-CM | POA: Diagnosis not present

## 2023-06-18 ENCOUNTER — Ambulatory Visit (INDEPENDENT_AMBULATORY_CARE_PROVIDER_SITE_OTHER): Payer: Commercial Managed Care - PPO | Admitting: Orthopedic Surgery

## 2023-06-18 DIAGNOSIS — M654 Radial styloid tenosynovitis [de Quervain]: Secondary | ICD-10-CM

## 2023-06-18 NOTE — Progress Notes (Signed)
   Annette Parks - 33 y.o. female MRN [id number]  Date of birth: August 12, 1990  Office Visit Note: Visit Date: 06/18/2023 PCP: Pearline Cables, MD Referred by: Pearline Cables, MD  Subjective:  HPI: Annette Parks is a 33 y.o. female who presents today for follow up 10 weeks status post left wrist first extensor tendon release, dorsal radial sensory nerve neurolysis.  She is doing well postoperatively.  Range of motion has improved at the wrist and thumb.  Numbness and tingling continues to improve as well.  Slight residual area of deficit at the dorsal radial aspect of the wrist and thumb  Pertinent ROS were reviewed with the patient and found to be negative unless otherwise specified above in HPI.   Assessment & Plan: Visit Diagnoses: No diagnosis found.  Plan: She continues to do very well postoperatively.  At this juncture, she has reached her postoperative goals from range of motion and strengthening standpoint.  Activities as tolerated moving forward without restriction.  She can return to me on an as-needed basis.  I did once again emphasize the slow nature of recovery after her dorsal radial sensory nerve neurolysis from a sensation standpoint, she expressed full understanding.   Follow-up: No follow-ups on file.   Meds & Orders: No orders of the defined types were placed in this encounter.  No orders of the defined types were placed in this encounter.    Procedures: No procedures performed       Objective:   Vital Signs: There were no vitals taken for this visit.  Ortho Exam Left wrist: - Well-healed incisional site at the glabrous/non glabrous border dorsal radial wrist - Full thumb circumduction without significant pain - Minimal swelling around the dorsal radial aspect of the wrist and digits - Thumb opposition to the small finger DPC - Thumb pinch strength right 15, left 15 - Negative Finkelstein's, no evidence of tendon instability with range of motion and  circumduction of the thumb  Imaging: No results found.   Calix Heinbaugh Trevor Mace, M.D. [facility], Hand Surgery

## 2023-06-20 ENCOUNTER — Encounter: Payer: Self-pay | Admitting: Physical Therapy

## 2023-06-20 ENCOUNTER — Ambulatory Visit: Payer: Commercial Managed Care - PPO | Admitting: Physical Therapy

## 2023-06-20 DIAGNOSIS — R252 Cramp and spasm: Secondary | ICD-10-CM

## 2023-06-20 DIAGNOSIS — R278 Other lack of coordination: Secondary | ICD-10-CM | POA: Diagnosis not present

## 2023-06-20 DIAGNOSIS — M6281 Muscle weakness (generalized): Secondary | ICD-10-CM

## 2023-06-20 NOTE — Therapy (Signed)
 OUTPATIENT PHYSICAL THERAPY FEMALE PELVIC TREATMENT   Patient Name: ROSENA BARTLE MRN: [id number] DOB:04/25/1991, 33 y.o., female Today's Date: 06/20/2023  END OF SESSION:  PT End of Session - 06/20/23 1016     Visit Number 2    Date for PT Re-Evaluation 12/11/23    Authorization Type cone    PT Start Time 1015    PT Stop Time 1055    PT Time Calculation (min) 40 min    Activity Tolerance Patient tolerated treatment well    Behavior During Therapy WFL for tasks assessed/performed             Past Medical History:  Diagnosis Date   Anal fissure    Hashimoto's disease    IBS (irritable bowel syndrome)    Migraine headache    Pelvic floor dysfunction?    Perforated eardrum    PONV (postoperative nausea and vomiting)    Pregnancy induced hypertension    UTI (urinary tract infection)    Vaginal Pap smear, abnormal    Past Surgical History:  Procedure Laterality Date   DILATION AND EVACUATION  09/06/2021   EAR TUBE REMOVAL     TONSILLECTOMY AND ADENOIDECTOMY     TYMPANOPLASTY Right    TYMPANOSTOMY TUBE PLACEMENT     x 3   WISDOM TOOTH EXTRACTION     Patient Active Problem List   Diagnosis Date Noted   Pregnancy 10/25/2022   Palpitations 01/18/2020   Closed fracture of fifth metacarpal bone of right hand 11/05/2019   Pain of right shoulder joint on movement 10/26/2019   Acute pain of right wrist 10/26/2019   Hashimoto's thyroiditis 09/04/2018   Hair loss 09/04/2018   Conductive hearing loss of right ear with unrestricted hearing of left ear 09/26/2017   Perforation of right tympanic membrane 09/26/2017   Hip pain, chronic, left 08/13/2017   Lower back pain 08/11/2017   Trochanteric bursitis of left hip 07/31/2017   Sacroiliac (ligament) sprain 07/31/2017   Common migraine 05/07/2016   Eye pain, bilateral 05/07/2016   Facial pain 05/07/2016   GAD (generalized anxiety disorder) 03/22/2015   Neck pain 07/11/2014   Myalgia 07/11/2014   Dysesthesia 07/11/2014     PCP: Pearline Cables, MD  REFERRING PROVIDER: Marcelle Overlie, MD   REFERRING DIAG: R10.2 (ICD-10-CM) - Pelvic and perineal pain  THERAPY DIAG:  Muscle weakness (generalized)  Other lack of coordination  Cramp and spasm  Rationale for Evaluation and Treatment: Rehabilitation  ONSET DATE: 09/2022  SUBJECTIVE:  SUBJECTIVE STATEMENT: I  had a follow-up with Dr. Adalberto Ill and not able to get the speculum in.  Fluid intake:   PAIN:  Are you having pain? Yes NPRS scale: 8/10 Pain location: Vaginal  Pain type: burning and tight  Pain description: intermittent   Aggravating factors: penile penetration and would last for days, walking and feels some swelling Relieving factors: no penile penetration  PRECAUTIONS: None  RED FLAGS: None   WEIGHT BEARING RESTRICTIONS: No  FALLS:  Has patient fallen in last 6 months? No  OCCUPATION: nurse  ACTIVITY LEVEL : none  PLOF: Independent  PATIENT GOALS: reduce pain for penile penetration, reduce incontinence  PERTINENT HISTORY:  Anal fissure; IBS Sexual abuse: No  BOWEL MOVEMENT: no issues Pain with bowel movement: No  URINATION: Pain with urination: No Fully empty bladder: Yes:   Stream:  strong and will angle to the left or right Urgency: No Frequency: average Leakage: Coughing, Sneezing, and Exercise Pads: Yes: wears more for sweating in the vaginal area  INTERCOURSE:  Ability to have vaginal penetration Yes.  Prior to birthing her child but has not had intercourse since then Pain with intercourse: Initial Penetration, During Penetration, Deep Penetration, and After Intercourse DrynessNo Climax: yes Marinoff Scale: 3/3  PREGNANCY: Vaginal deliveries 1 Tearing Yes: tore in 3 spots internally and by the urethra Currently  pregnant No    OBJECTIVE:  Note: Objective measures were completed at Evaluation unless otherwise noted.  DIAGNOSTIC FINDINGS:  none    COGNITION: Overall cognitive status: Within functional limits for tasks assessed     SENSATION: Light touch: Appears intact   POSTURE: No Significant postural limitations   LUMBARAROM/PROM:  A/PROM A/PROM  eval  Flexion full  Extension Decreased by 50%  Right lateral flexion Decreased by 25%  Left lateral flexion full  Right rotation Decreased by 25%  Left rotation full   (Blank rows = not tested)  LOWER EXTREMITY ROM: full bilateral hip ROM   LOWER EXTREMITY MMT:  MMT Right eval Left eval  Hip extension 4/5 4/5  Hip abduction 3/5 3/5   (Blank rows = not tested) PALPATION:   General: tenderness located in the lumbar, decreased movement of the lumbar vertebrae  Pelvic Alignment: Decreased movement of the right SI joint; Right ilium rotated anteriorly; sacrum rotated to the left  Abdominal: Good abdominal contraction                External Perineal Exam: tender in the bulbocavernosus, ischiocavernosus, perineal body, decreased mobility of the perineal body, vulvar area is pale, decreased sensation along the 11:00  to 1:00; two scars that are tender along the posterior right latera area that is very tender                             Internal Pelvic Floor: tenderness throughout the pelvic floor, decreased mobility of the posterior canal and decreased sensation.  Patient confirms identification and approves PT to assess internal pelvic floor and treatment Yes  PELVIC MMT:   MMT eval  Vaginal 3/5 but very small lift  Diastasis Recti 1 finger width below umbilicus  (Blank rows = not tested)        TONE: increased   TODAY'S TREATMENT:  06/20/23 Manual: Soft tissue mobilization: Manual work to the lumbar paraspinals and lateral abdominal, and quadratus Manual work to the diaphragm in quadruped and supine to elongate  the tissue for breath Quadruped with pulling tissue from  the spine to the abdomen, pulling the tissue from the pubic bone with rocking back and forth .  Myofascial release: Tissue rolling of the lumbar and posterior rib cage and lower anterior rib cage Spinal mobilization: Gapping of the lumbar facets from L3-5 both sides laying on the side grade 3 Neuromuscular re-education: Down training: Supine diaphragmatic breathing with tactile cues to expand the lower rib cage Childs pose with diaphragmatic breathing focusing on the posterior rib cage Laying on side with stretching the lateral rib cage and breathing into the lateral rib cage Sitting trunk rotation to elongate the spine after mobilization Self-care: Assisted patient on ordering her dilators and vaginal wand to work on the pelvic floor to elongate the tissue and reduce trigger points.                                                                                                                                 DATE: 06/13/23  EVAL see below   PATIENT EDUCATION:  06/20/23 Education details: educated patient on putting her estrogen cream on the vulvar area, education on vaginal dilators and wand to improve elasticity of the tissue; Access Code: [email or web address] Person educated: Patient Education method: Explanation, Demonstration, Tactile cues, Verbal cues, and Handouts Education comprehension: verbalized understanding, returned demonstration, verbal cues required, tactile cues required, and needs further education  HOME EXERCISE PROGRAM:  06/20/23 Access Code: [email or web address] URL: [email or web address] Date: 06/20/2023 Prepared by: Eulis Foster  Program Notes sitting trunk rotation holding 30 sec both ways. sit on ball and massage the pelvic floor for 3-5 minutes per day.   Exercises - Diaphragmatic Breathing in Child's Pose with Pelvic Floor Relaxation  - 1 x daily - 7 x weekly - 1 sets - 10 reps - Seated Lateral Trunk Stretch  on Swiss Ball  - 1 x daily - 7 x weekly - 1 sets - 2 reps - 15 sec hold   ASSESSMENT:  CLINICAL IMPRESSION: Patient is a 33 y.o. female who was seen today for physical therapy  treatment for pelvic and perineal pain. Patient was able to expand the lower rib cage with diaphragmatic breathing after manual work. She is tighter on the right diaphragm. She was able to sit on a ball and massage the pelvic floor with some discomfort and able to manage the amount of pressure the ball is giving.  Patient reports the doctor was not able to place the speculum into the vaginal canal due to the pain. Patient will benefit from skilled therapy to improve pelvic floor strength and coordination and reduce her pain.   OBJECTIVE IMPAIRMENTS: decreased coordination, decreased endurance, decreased ROM, decreased strength, increased fascial restrictions, increased muscle spasms, and pain.   ACTIVITY LIMITATIONS: continence, toileting, and locomotion level  PARTICIPATION LIMITATIONS: interpersonal relationship, driving, shopping, and community activity  PERSONAL FACTORS: Time since onset of injury/illness/exacerbation are also affecting patient's functional outcome.   REHAB POTENTIAL: Excellent  CLINICAL DECISION MAKING: Evolving/moderate complexity  EVALUATION COMPLEXITY: Moderate   GOALS: Goals reviewed with patient? Yes  SHORT TERM GOALS: Target date: 07/11/23  Patient educated on how to use her cream vaginally.  Baseline: Goal status: INITIAL  2.  Patient educated on how to massage the perineal body and scars around the area to reduce pain.  Baseline:  Goal status: INITIAL  3.  Patient independent with initial  HEP for hip stretches and diaphragmatic breathing to lengthen the pelvic floor.  Baseline:  Goal status: INITIAL  4.  Patient educate on ways to manage her pelvic pain with vaginal penetration.  Baseline:  Goal status: INITIAL    LONG TERM GOALS: Target date: 12/11/23  Patient  independent with advanced HEP for pelvic floor manual work and coordination.  Baseline:  Goal status: INITIAL  2.  Patient is using the vaginal dilators and on the largest one so she is able to have penile penetration vaginally.  Baseline:  Goal status: INITIAL  3.  Patient reports urinary leakage with Coughing, Sneezing, and Exercise >/= 75%. Baseline:  Goal status: INITIAL  4.  Patient is able to walk for 45 minutes without vaginal pain due reduction of trigger points.  Baseline:  Goal status: INITIAL   PLAN:  PT FREQUENCY: 1-2x/week  PT DURATION: 6 months  PLANNED INTERVENTIONS: 97110-Therapeutic exercises, 97530- Therapeutic activity, 97112- Neuromuscular re-education, 97535- Self Care, 98119- Manual therapy, [phone]- Aquatic Therapy, [id number]- Electrical stimulation (unattended), 97035- Ultrasound, Patient/Family education, Dry Needling, Joint mobilization, Spinal mobilization, Cryotherapy, Moist heat, and Biofeedback  PLAN FOR NEXT SESSION: manual work externally to the pelvic floor,  hip stretches,  mobilization to SI joint, work on the gluteals fascia and thighs, deep squat   Eulis Foster, PT 06/20/23 11:17 AM

## 2023-06-27 ENCOUNTER — Encounter: Payer: 59 | Admitting: Physical Therapy

## 2023-07-04 ENCOUNTER — Encounter: Payer: Self-pay | Admitting: Physical Therapy

## 2023-07-04 ENCOUNTER — Ambulatory Visit: Payer: Self-pay | Attending: Obstetrics and Gynecology | Admitting: Physical Therapy

## 2023-07-04 DIAGNOSIS — R278 Other lack of coordination: Secondary | ICD-10-CM | POA: Insufficient documentation

## 2023-07-04 DIAGNOSIS — M6281 Muscle weakness (generalized): Secondary | ICD-10-CM | POA: Insufficient documentation

## 2023-07-04 DIAGNOSIS — R252 Cramp and spasm: Secondary | ICD-10-CM | POA: Diagnosis not present

## 2023-07-04 NOTE — Therapy (Signed)
 OUTPATIENT PHYSICAL THERAPY FEMALE PELVIC TREATMENT   Patient Name: Annette Parks MRN: [id number] DOB:1990/06/14, 33 y.o., female Today's Date: 07/04/2023  END OF SESSION:  PT End of Session - 07/04/23 0801     Visit Number 3    Date for PT Re-Evaluation 12/11/23    Authorization Type cone    PT Start Time 0800    PT Stop Time 0840    PT Time Calculation (min) 40 min    Activity Tolerance Patient tolerated treatment well    Behavior During Therapy WFL for tasks assessed/performed             Past Medical History:  Diagnosis Date   Anal fissure    Hashimoto's disease    IBS (irritable bowel syndrome)    Migraine headache    Pelvic floor dysfunction?    Perforated eardrum    PONV (postoperative nausea and vomiting)    Pregnancy induced hypertension    UTI (urinary tract infection)    Vaginal Pap smear, abnormal    Past Surgical History:  Procedure Laterality Date   DILATION AND EVACUATION  09/06/2021   EAR TUBE REMOVAL     TONSILLECTOMY AND ADENOIDECTOMY     TYMPANOPLASTY Right    TYMPANOSTOMY TUBE PLACEMENT     x 3   WISDOM TOOTH EXTRACTION     Patient Active Problem List   Diagnosis Date Noted   Pregnancy 10/25/2022   Palpitations 01/18/2020   Closed fracture of fifth metacarpal bone of right hand 11/05/2019   Pain of right shoulder joint on movement 10/26/2019   Acute pain of right wrist 10/26/2019   Hashimoto's thyroiditis 09/04/2018   Hair loss 09/04/2018   Conductive hearing loss of right ear with unrestricted hearing of left ear 09/26/2017   Perforation of right tympanic membrane 09/26/2017   Hip pain, chronic, left 08/13/2017   Lower back pain 08/11/2017   Trochanteric bursitis of left hip 07/31/2017   Sacroiliac (ligament) sprain 07/31/2017   Common migraine 05/07/2016   Eye pain, bilateral 05/07/2016   Facial pain 05/07/2016   GAD (generalized anxiety disorder) 03/22/2015   Neck pain 07/11/2014   Myalgia 07/11/2014   Dysesthesia 07/11/2014     PCP: Pearline Cables, MD  REFERRING PROVIDER: Marcelle Overlie, MD   REFERRING DIAG: R10.2 (ICD-10-CM) - Pelvic and perineal pain  THERAPY DIAG:  Muscle weakness (generalized)  Other lack of coordination  Cramp and spasm  Rationale for Evaluation and Treatment: Rehabilitation  ONSET DATE: 09/2022  SUBJECTIVE:  SUBJECTIVE STATEMENT: I got the dilators, vaginal wand and weights to use.   PAIN:  Are you having pain? Yes NPRS scale: 8/10 Pain location: Vaginal  Pain type: burning and tight  Pain description: intermittent   Aggravating factors: penile penetration and would last for days, walking and feels some swelling Relieving factors: no penile penetration  PRECAUTIONS: None  RED FLAGS: None   WEIGHT BEARING RESTRICTIONS: No  FALLS:  Has patient fallen in last 6 months? No  OCCUPATION: nurse  ACTIVITY LEVEL : none  PLOF: Independent  PATIENT GOALS: reduce pain for penile penetration, reduce incontinence  PERTINENT HISTORY:  Anal fissure; IBS Sexual abuse: No  BOWEL MOVEMENT: no issues Pain with bowel movement: No  URINATION: Pain with urination: No Fully empty bladder: Yes:   Stream:  strong and will angle to the left or right Urgency: No Frequency: average Leakage: Coughing, Sneezing, and Exercise Pads: Yes: wears more for sweating in the vaginal area  INTERCOURSE:  Ability to have vaginal penetration Yes.  Prior to birthing her child but has not had intercourse since then Pain with intercourse: Initial Penetration, During Penetration, Deep Penetration, and After Intercourse DrynessNo Climax: yes Marinoff Scale: 3/3  PREGNANCY: Vaginal deliveries 1 Tearing Yes: tore in 3 spots internally and by the urethra Currently pregnant No    OBJECTIVE:   Note: Objective measures were completed at Evaluation unless otherwise noted.  DIAGNOSTIC FINDINGS:  none    COGNITION: Overall cognitive status: Within functional limits for tasks assessed     SENSATION: Light touch: Appears intact   POSTURE: No Significant postural limitations   LUMBARAROM/PROM:  A/PROM A/PROM  eval  Flexion full  Extension Decreased by 50%  Right lateral flexion Decreased by 25%  Left lateral flexion full  Right rotation Decreased by 25%  Left rotation full   (Blank rows = not tested)  LOWER EXTREMITY ROM: full bilateral hip ROM   LOWER EXTREMITY MMT:  MMT Right eval Left eval  Hip extension 4/5 4/5  Hip abduction 3/5 3/5   (Blank rows = not tested) PALPATION:   General: tenderness located in the lumbar, decreased movement of the lumbar vertebrae  Pelvic Alignment: Decreased movement of the right SI joint; Right ilium rotated anteriorly; sacrum rotated to the left  Abdominal: Good abdominal contraction                External Perineal Exam: tender in the bulbocavernosus, ischiocavernosus, perineal body, decreased mobility of the perineal body, vulvar area is pale, decreased sensation along the 11:00  to 1:00; two scars that are tender along the posterior right latera area that is very tender                             Internal Pelvic Floor: tenderness throughout the pelvic floor, decreased mobility of the posterior canal and decreased sensation.  Patient confirms identification and approves PT to assess internal pelvic floor and treatment Yes  PELVIC MMT:   MMT eval  Vaginal 3/5 but very small lift  Diastasis Recti 1 finger width below umbilicus  (Blank rows = not tested)        TONE: increased   TODAY'S TREATMENT:  07/04/23 Manual: Myofascial release: Fascial release around the gluteals, hamstring, hip adductors, lumbar, and quadriceps to improve fascial mobility Exercises: Stretches/mobility: Sitting piriformis stretch  holding 30 sec bil.  Sitting hamstring stretch holding 30 sec bil.  Sitting bilateral hip internal rotation  Sitting with  hand between knees moving thighs forward and back Self-care: Educated patient on how to use the vaginal wand on the perineal area and vulvar area to reduce fascial restriction Educated patient    06/20/23 Manual: Soft tissue mobilization: Manual work to the lumbar paraspinals and lateral abdominal, and quadratus Manual work to the diaphragm in quadruped and supine to elongate the tissue for breath Quadruped with pulling tissue from the spine to the abdomen, pulling the tissue from the pubic bone with rocking back and forth .  Myofascial release: Tissue rolling of the lumbar and posterior rib cage and lower anterior rib cage Spinal mobilization: Gapping of the lumbar facets from L3-5 both sides laying on the side grade 3 Neuromuscular re-education: Down training: Supine diaphragmatic breathing with tactile cues to expand the lower rib cage Childs pose with diaphragmatic breathing focusing on the posterior rib cage Laying on side with stretching the lateral rib cage and breathing into the lateral rib cage Sitting trunk rotation to elongate the spine after mobilization Self-care: Assisted patient on ordering her dilators and vaginal wand to work on the pelvic floor to elongate the tissue and reduce trigger points.                                                                                                                                 DATE: 06/13/23  EVAL see below   PATIENT EDUCATION:  07/04/23 Education details: education on vaginal dilators and wand to improve elasticity of the tissue; Access Code: [email or web address] Person educated: Patient Education method: Explanation, Demonstration, Tactile cues, Verbal cues, and Handouts Education comprehension: verbalized understanding, returned demonstration, verbal cues required, tactile cues required, and needs further  education  HOME EXERCISE PROGRAM: 07/04/23 Access Code: [email or web address] URL: [email or web address] Date: 07/04/2023 Prepared by: Eulis Foster  Program Notes sitting trunk rotation holding 30 sec both ways. sit on ball and massage the pelvic floor for 3-5 minutes per day. sitting hand between knees and move thighs forward and back  Exercises - Diaphragmatic Breathing in Child's Pose with Pelvic Floor Relaxation  - 1 x daily - 7 x weekly - 1 sets - 10 reps - Seated Lateral Trunk Stretch on Swiss Ball  - 1 x daily - 7 x weekly - 1 sets - 2 reps - 15 sec hold - Seated Piriformis Stretch with Trunk Bend  - 1 x daily - 7 x weekly - 1 sets - 1 reps - 30 sec hold - Seated Hamstring Stretch  - 1 x daily - 7 x weekly - 1 sets - 1 reps - 30 sec hold - Seated Hip Internal Rotation with Ball and Resistance  - 1 x daily - 7 x weekly - 1 sets - 10 reps   ASSESSMENT:  CLINICAL IMPRESSION: Patient is a 33 y.o. female who was seen today for physical therapy  treatment for pelvic and perineal pain. Patient is starting to get comfortable with therapist working around the vaginal  area. She will start to use a vaginal wand on the outside perineal area to improve fascial mobility, be used to the area to be touched and prepare for the therapist to work on the area. She is using the estrogen cream daily to assist with the health of the vaginal tissue.  Patient will benefit from skilled therapy to improve pelvic floor strength and coordination and reduce her pain.   OBJECTIVE IMPAIRMENTS: decreased coordination, decreased endurance, decreased ROM, decreased strength, increased fascial restrictions, increased muscle spasms, and pain.   ACTIVITY LIMITATIONS: continence, toileting, and locomotion level  PARTICIPATION LIMITATIONS: interpersonal relationship, driving, shopping, and community activity  PERSONAL FACTORS: Time since onset of injury/illness/exacerbation are also affecting patient's functional  outcome.   REHAB POTENTIAL: Excellent  CLINICAL DECISION MAKING: Evolving/moderate complexity  EVALUATION COMPLEXITY: Moderate   GOALS: Goals reviewed with patient? Yes  SHORT TERM GOALS: Target date: 07/11/23  Patient educated on how to use her cream vaginally.  Baseline: Goal status: Met 07/04/23  2.  Patient educated on how to massage the perineal body and scars around the area to reduce pain.  Baseline:  Goal status: Met 07/04/23  3.  Patient independent with initial  HEP for hip stretches and diaphragmatic breathing to lengthen the pelvic floor.  Baseline:  Goal status: INITIAL  4.  Patient educate on ways to manage her pelvic pain with vaginal penetration.  Baseline:  Goal status: INITIAL    LONG TERM GOALS: Target date: 12/11/23  Patient independent with advanced HEP for pelvic floor manual work and coordination.  Baseline:  Goal status: INITIAL  2.  Patient is using the vaginal dilators and on the largest one so she is able to have penile penetration vaginally.  Baseline:  Goal status: INITIAL  3.  Patient reports urinary leakage with Coughing, Sneezing, and Exercise >/= 75%. Baseline:  Goal status: INITIAL  4.  Patient is able to walk for 45 minutes without vaginal pain due reduction of trigger points.  Baseline:  Goal status: INITIAL   PLAN:  PT FREQUENCY: 1-2x/week  PT DURATION: 6 months  PLANNED INTERVENTIONS: 97110-Therapeutic exercises, 97530- Therapeutic activity, 97112- Neuromuscular re-education, 97535- Self Care, 69629- Manual therapy, [phone]- Aquatic Therapy, 97014- Electrical stimulation (unattended), 97035- Ultrasound, Patient/Family education, Dry Needling, Joint mobilization, Spinal mobilization, Cryotherapy, Moist heat, and Biofeedback  PLAN FOR NEXT SESSION: manual work externally to the pelvic floor,  deep squat   Eulis Foster, PT 07/04/23 8:46 AM

## 2023-07-08 DIAGNOSIS — D225 Melanocytic nevi of trunk: Secondary | ICD-10-CM | POA: Diagnosis not present

## 2023-07-08 DIAGNOSIS — Z129 Encounter for screening for malignant neoplasm, site unspecified: Secondary | ICD-10-CM | POA: Diagnosis not present

## 2023-07-08 DIAGNOSIS — L578 Other skin changes due to chronic exposure to nonionizing radiation: Secondary | ICD-10-CM | POA: Diagnosis not present

## 2023-07-08 DIAGNOSIS — Z86018 Personal history of other benign neoplasm: Secondary | ICD-10-CM | POA: Diagnosis not present

## 2023-07-08 DIAGNOSIS — L821 Other seborrheic keratosis: Secondary | ICD-10-CM | POA: Diagnosis not present

## 2023-07-21 NOTE — Progress Notes (Unsigned)
 [facility] [address] [place], [place] [address] [phone]  Date:  07/23/2023   Name:  Annette Parks   DOB:  Dec 23, 1990   MRN:  [id number]  PCP:  Pearline Cables, MD    Chief Complaint: No chief complaint on file.   History of Present Illness:  Annette Parks is a 33 y.o. very pleasant female patient who presents with the following:  Pt seen today virtually with concern of depression Last seen by myself in September for her CPE - history of hashimoto's thyroiditis   Patient Active Problem List   Diagnosis Date Noted   Pregnancy 10/25/2022   Palpitations 01/18/2020   Closed fracture of fifth metacarpal bone of right hand 11/05/2019   Pain of right shoulder joint on movement 10/26/2019   Acute pain of right wrist 10/26/2019   Hashimoto's thyroiditis 09/04/2018   Hair loss 09/04/2018   Conductive hearing loss of right ear with unrestricted hearing of left ear 09/26/2017   Perforation of right tympanic membrane 09/26/2017   Hip pain, chronic, left 08/13/2017   Lower back pain 08/11/2017   Trochanteric bursitis of left hip 07/31/2017   Sacroiliac (ligament) sprain 07/31/2017   Common migraine 05/07/2016   Eye pain, bilateral 05/07/2016   Facial pain 05/07/2016   GAD (generalized anxiety disorder) 03/22/2015   Neck pain 07/11/2014   Myalgia 07/11/2014   Dysesthesia 07/11/2014    Past Medical History:  Diagnosis Date   Anal fissure    Hashimoto's disease    IBS (irritable bowel syndrome)    Migraine headache    Pelvic floor dysfunction?    Perforated eardrum    PONV (postoperative nausea and vomiting)    Pregnancy induced hypertension    UTI (urinary tract infection)    Vaginal Pap smear, abnormal     Past Surgical History:  Procedure Laterality Date   DILATION AND EVACUATION  09/06/2021   EAR TUBE REMOVAL     TONSILLECTOMY AND ADENOIDECTOMY     TYMPANOPLASTY Right    TYMPANOSTOMY TUBE  PLACEMENT     x 3   WISDOM TOOTH EXTRACTION      Social History   Tobacco Use   Smoking status: Never   Smokeless tobacco: Never  Vaping Use   Vaping status: Never Used  Substance Use Topics   Alcohol use: Not Currently    Comment: occasional-2 per month   Drug use: No    Family History  Problem Relation Age of Onset   Migraines Mother    Hashimoto's thyroiditis Mother    Endometriosis Mother    Hyperlipidemia Father    Diverticulitis Father        had to have part of colon removed   Asthma Brother    Diabetes Maternal Grandmother     No Known Allergies  Medication list has been reviewed and updated.  Current Outpatient Medications on File Prior to Visit  Medication Sig Dispense Refill   cetirizine (ZYRTEC ALLERGY) 10 MG tablet Take 1 tablet (10 mg total) by mouth daily. 30 tablet 2   fluticasone (FLONASE) 50 MCG/ACT nasal spray Place 2 sprays into both nostrils daily. 16 g 6   predniSONE (DELTASONE) 20 MG tablet Take 2 tabs PO daily x 5 days. 10 tablet 0   Prenatal Vit-Fe Fumarate-FA (PRENATAL VITAMIN PO)      promethazine-dextromethorphan (PROMETHAZINE-DM) 6.25-15 MG/5ML syrup Take 5 mLs by mouth 2 (two) times daily as needed  for cough. 118 mL 0   VITAMIN D PO      No current facility-administered medications on file prior to visit.    Review of Systems:  As per HPI- otherwise negative.   Physical Examination: There were no vitals filed for this visit. There were no vitals filed for this visit. There is no height or weight on file to calculate BMI. Ideal Body Weight:    ***  Assessment and Plan: ***  Signed Abbe Amsterdam, MD

## 2023-07-23 ENCOUNTER — Telehealth: Admitting: Family Medicine

## 2023-07-23 DIAGNOSIS — F4323 Adjustment disorder with mixed anxiety and depressed mood: Secondary | ICD-10-CM | POA: Diagnosis not present

## 2023-07-23 MED ORDER — FLUOXETINE HCL 20 MG PO CAPS
20.0000 mg | ORAL_CAPSULE | Freq: Every day | ORAL | 3 refills | Status: DC
Start: 2023-07-23 — End: 2023-08-22

## 2023-08-18 ENCOUNTER — Ambulatory Visit: Payer: Commercial Managed Care - PPO | Admitting: Physical Therapy

## 2023-08-22 ENCOUNTER — Ambulatory Visit: Payer: Commercial Managed Care - PPO | Admitting: Internal Medicine

## 2023-08-22 ENCOUNTER — Encounter: Payer: Self-pay | Admitting: Internal Medicine

## 2023-08-22 VITALS — BP 110/70 | HR 97 | Ht 65.0 in | Wt 133.0 lb

## 2023-08-22 DIAGNOSIS — E0500 Thyrotoxicosis with diffuse goiter without thyrotoxic crisis or storm: Secondary | ICD-10-CM

## 2023-08-22 DIAGNOSIS — E063 Autoimmune thyroiditis: Secondary | ICD-10-CM

## 2023-08-22 LAB — TSH: TSH: 1.77 m[IU]/L

## 2023-08-22 LAB — T4, FREE: Free T4: 1.4 ng/dL (ref 0.8–1.8)

## 2023-08-22 LAB — T3, FREE: T3, Free: 3.8 pg/mL (ref 2.3–4.2)

## 2023-08-22 NOTE — Patient Instructions (Signed)
Please stop at the lab.  Please return in 6 months. 

## 2023-08-22 NOTE — Progress Notes (Addendum)
 Patient ID: Annette Parks, female   DOB: 02-Jun-1990, 33 y.o.   MRN: [id number]   HPI  Annette Parks is a 33 y.o.-year-old very pleasant female, initially referred by her PCP, Dr. Allison Arena, returning for follow-up for Hashimoto's thyroiditis.  Last visit 3 months ago (virtual). Her mother, Annette Parks, is also my patient.  Interim hx: Patient mentions that she had a stressful period of time since our last visit: Husband lost job (he now has another one), son was sick and had to have your tubes placed, he also had a significant URI. She denies tremors, palpitations, heat intolerance, or unintentional weight loss, however, she describes an increased appetite and still lost 3 pounds since our last in person visit.  She also has fatigue despite sleeping well at night.  She has anxiety and was offered Prozac  by PCP but she wanted to make sure that her thyroid  tests are still normal before starting this. She also describes significant hair loss, light sensitivity (started in 12/2022). She sees low pulse - down to 49-50s when laying down.   Reviewed history: She was diagnosed with Hashimoto's thyroiditis in 07/2018.  She has normal thyroid  function tests then so she did not require levothyroxine.  However, in 02/2023 she had thyrotoxic thyroid  tests and also retrospectively hair loss, palpitations, anxiety, excessive weight loss, fatigue, mood swings, and increase sensitivity to light.  No tremors.  Reviewed her TFTs: Lab Results  Component Value Date   TSH 1.11 06/13/2023   TSH 0.00 (L) 03/21/2023   TSH 1.96 12/11/2022   TSH 2.67 01/09/2022   TSH 1.96 09/18/2021   TSH 1.60 09/18/2020   TSH 1.67 12/27/2019   TSH 1.35 09/13/2019   TSH 1.58 03/08/2019   TSH 1.66 08/20/2018   FREET4 1.2 06/13/2023   FREET4 1.86 (H) 03/21/2023   FREET4 0.96 09/18/2021   FREET4 0.90 09/18/2020   FREET4 0.80 09/13/2019   FREET4 1.02 03/08/2019  03/14/2023:   Her thyroid  antibodies were elevated: Lab Results   Component Value Date   TSI 316 (H) 03/21/2023     Component     Latest Ref Rng & Units 03/22/2015  Thyroperoxidase Ab SerPl-aCnc     <9 IU/mL 2  We started selenium 200 mcg daily in 08/2018, but her TPO antibodies were still elevated at last check so we stopped it.  Thyroid  uptake and scan (03/21/2023): Uniform uptake within enlarged thyroid  gland. Pyramidal lobe evident.  4 hour I-123 uptake = 8.8% (normal 5-20%) 24 hour I-123 uptake = 17.2% (normal 10-30%)   IMPRESSION: 1. Uniform uptake in enlarged gland. Foraminal pyramidal lobe. No nodularity. 2. Iodine uptake within normal limits.  She had an ectopic pregnancy 11/2020 for which she had to get methotrexate .  Before last visit, she had a miscarriage at 35 weeks,for which she had to have a D and C.  Afterwards, she had a healthy pregnancy and gave birth to a healthy baby boy (Hunter) 10/25/2022.  Pt denies: - feeling nodules in neck - hoarseness - dysphagia - choking  She has + FH of thyroid  disorders in: mother, MGM. No FH of thyroid  cancer. No h/o radiation tx to head or neck. No herbal supplements. No Biotin use now. No recent steroids use. She takes a MVI, vitamin D , now also Zyrtec .  Pt. also has a history of Raynauds phenomenon. She was on OCPs - since a teenager. She tried to stop x 8 mo restarted b/c acne and also dysmenorrhea.  She also has a history  of heart valve insufficiency. She has neck, lower back pain, hip pain; she was diagnosed with spina bifida occulta. She has constipation (chronic) and anal fissures. She has hyperlipidemia. She was on Amitriptyline  for bladder dysfxn.  Previously had investigation for palpitations and was found to have PVCs, by cardiology.    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  ROS: + See HPI  I reviewed pt's medications, allergies, PMH, social hx, family hx, and changes were documented in the history of present illness. Otherwise, unchanged from my initial visit note.  Past  Medical History:  Diagnosis Date   Anal fissure    Hashimoto's disease    IBS (irritable bowel syndrome)    Migraine headache    Pelvic floor dysfunction?    Perforated eardrum    PONV (postoperative nausea and vomiting)    Pregnancy induced hypertension    UTI (urinary tract infection)    Vaginal Pap smear, abnormal    Past Surgical History:  Procedure Laterality Date   DILATION AND EVACUATION  09/06/2021   EAR TUBE REMOVAL     TONSILLECTOMY AND ADENOIDECTOMY     TYMPANOPLASTY Right    TYMPANOSTOMY TUBE PLACEMENT     x 3   WISDOM TOOTH EXTRACTION     Social History   Socioeconomic History   Marital status: Married    Spouse name: Not on file   Number of children: 0   Years of education: Not on file   Highest education level: Not on file  Occupational History   Occupation: Registered nurse  Tobacco Use   Smoking status: Never   Smokeless tobacco: Never  Vaping Use   Vaping status: Never Used  Substance and Sexual Activity   Alcohol use: Not Currently    Comment: occasional-2 per month   Drug use: No   Sexual activity: Not Currently  Other Topics Concern   Not on file  Social History Narrative   Married with 1 child     moved here from [place]  went to [employer]. Moved here after her parents moved here.   Clinic nurse in [employer]   Social Drivers of Health   Financial Resource Strain: Not on file  Food Insecurity: No Food Insecurity (10/25/2022)   Hunger Vital Sign    Worried About Running Out of Food in the Last Year: Never true    Ran Out of Food in the Last Year: Never true  Transportation Needs: No Transportation Needs (10/25/2022)   PRAPARE - Administrator, Civil Service (Medical): No    Lack of Transportation (Non-Medical): No  Physical Activity: Not on file  Stress: Not on file  Social Connections: Not on file  Intimate Partner Violence: Not At Risk (10/25/2022)   Humiliation, Afraid, Rape, and  Kick questionnaire    Fear of Current or Ex-Partner: No    Emotionally Abused: No    Physically Abused: No    Sexually Abused: No   Current Outpatient Medications on File Prior to Visit  Medication Sig Dispense Refill   tretinoin (RETIN-A) 0.025 % cream Apply topically at bedtime.     cetirizine  (ZYRTEC ) 10 MG tablet Take 1 tablet by mouth daily.     Prenatal Vit-Fe Fumarate-FA (PRENATAL VITAMIN PO)      VITAMIN D  PO      No current facility-administered medications on file prior to visit.   No Known Allergies Family History  Problem Relation Age of Onset   Migraines Mother    Hashimoto's  thyroiditis Mother    Endometriosis Mother    Hyperlipidemia Father    Diverticulitis Father        had to have part of colon removed   Asthma Brother    Diabetes Maternal Grandmother    PE: 132 lbs now BP 110/70 (BP Location: Left Arm, Patient Position: Sitting, Cuff Size: Small)   Pulse 97   Ht 5\' 5"  (1.651 m)   Wt 133 lb (60.3 kg)   SpO2 99%   BMI 22.13 kg/m   Wt Readings from Last 15 Encounters:  08/22/23 133 lb (60.3 kg)  03/21/23 136 lb 6.4 oz (61.9 kg)  01/13/23 145 lb 6.4 oz (66 kg)  10/25/22 183 lb 12.8 oz (83.4 kg)  09/24/22 178 lb (80.7 kg)  08/18/22 170 lb 1.6 oz (77.2 kg)  05/06/22 144 lb 6.4 oz (65.5 kg)  01/09/22 144 lb 3.2 oz (65.4 kg)  10/01/21 140 lb (63.5 kg)  09/18/21 141 lb 12.8 oz (64.3 kg)  01/08/21 132 lb (59.9 kg)  01/05/21 132 lb 8 oz (60.1 kg)  09/18/20 130 lb (59 kg)  07/11/20 129 lb (58.5 kg)  06/27/20 129 lb (58.5 kg)   Constitutional: normal weight, in NAD Eyes:  EOMI, no exophthalmos ENT: no neck masses, no cervical lymphadenopathy Cardiovascular: Tachycardia, RR, No MRG Respiratory: CTA B Musculoskeletal: no deformities Skin:no rashes Neurological: no tremor with outstretched hands  ASSESSMENT: 1. Hashimoto thyroiditis  2.Graves ds.  PLAN: 1. And 2.  Patient with history of Hashimoto thyroiditis, for which she did not require  levothyroxine yet.  She did well during the last pregnancy, without the need for levothyroxine.  She previously had an ectopic pregnancy in 2023 and then a miscarriage, but then gave birth to a healthy boy.  TFTs were excellent in 11/2022, approximately 1.5 after giving birth.  However, in 02/2023, she had an undetectable TSH along with a high total T4.  She also had thyrotoxic symptoms including hair loss, heart racing, anxiety, restlessness, excessive weight loss (approximately 50 pounds after delivery), fatigue, mood swings, and sensitivity to light.  We did discuss about the possible differential diagnosis for her newly developed thyrotoxicosis to include viral subacute thyroiditis (from COVID-19 infection in 12/2022), postpartum thyroiditis, thyrotoxic episode of Hashimoto's thyroiditis, or Graves' disease.  Her TSI and TPO antibodies were elevated and we checked a thyroid  uptake and scan.  The uptake was normal and the scan was uniform, with a detectable pyramidal lobe, confirming mild Graves' disease.  We discussed about methimazole treatment and I also suggested a beta-blocker to help with the palpitations and anxiety, but she was breast-feeding and wanted to hold off starting these. - At our last visit, she had an upper respiratory infection and we had to do this virtually.  She was on antibiotics and prednisone .  I advised her to come back to repeat her thyroid  tests after her upper respiratory infection resolved.  She came back in 05/2023 and all of her TFTs were normal at that time. - At today's visit, she  describes several symptoms including increased hair loss, fatigue, increased light sensitivity when driving, increased appetite without weight gain, and also blood sugar dropping to the 50s on her smart watch.  She did have a period time with increased stress since last visit and we discussed that this could cause some of the above symptoms.  However, we will recheck her TFTs today to see if they  became abnormal since 2 months ago -If her TFTs are normal today,  she may need to have a full evaluation by PCP - I we will have her come back in 6 months.  Component     Latest Ref Rng 08/22/2023  TSH     mIU/L 1.77   T4,Free(Direct)     0.8 - 1.8 ng/dL 1.4   Triiodothyronine,Free,Serum     2.3 - 4.2 pg/mL 3.8   Normal.  Emilie Harden, MD PhD [facility] End[facility]

## 2023-09-03 ENCOUNTER — Ambulatory Visit: Payer: Commercial Managed Care - PPO | Attending: Obstetrics and Gynecology | Admitting: Physical Therapy

## 2023-09-03 ENCOUNTER — Encounter: Payer: Self-pay | Admitting: Physical Therapy

## 2023-09-03 DIAGNOSIS — M6281 Muscle weakness (generalized): Secondary | ICD-10-CM | POA: Insufficient documentation

## 2023-09-03 DIAGNOSIS — R252 Cramp and spasm: Secondary | ICD-10-CM | POA: Insufficient documentation

## 2023-09-03 DIAGNOSIS — R278 Other lack of coordination: Secondary | ICD-10-CM | POA: Diagnosis not present

## 2023-09-03 NOTE — Patient Instructions (Addendum)
 Trigger Point Dry Needling  What is Trigger Point Dry Needling (DN)? DN is a physical therapy technique used to treat muscle pain and dysfunction. Specifically, DN helps deactivate muscle trigger points (muscle knots).  A thin filiform needle is used to penetrate the skin and stimulate the underlying trigger point. The goal is for a local twitch response (LTR) to occur and for the trigger point to relax. No medication of any kind is injected during the procedure.   What Does Trigger Point Dry Needling Feel Like?  The procedure feels different for each individual patient. Some patients report that they do not actually feel the needle enter the skin and overall the process is not painful. Very mild bleeding may occur. However, many patients feel a deep cramping in the muscle in which the needle was inserted. This is the local twitch response.   How Will I feel after the treatment? Soreness is normal, and the onset of soreness may not occur for a few hours. Typically this soreness does not last longer than two days.  Bruising is uncommon, however; ice can be used to decrease any possible bruising.  In rare cases feeling tired or nauseous after the treatment is normal. In addition, your symptoms may get worse before they get better, this period will typically not last longer than 24 hours.   What Can I do After My Treatment? Increase your hydration by drinking more water for the next 24 hours.  You may place ice or heat on the areas treated that have become sore, however, do not use heat on inflamed or bruised areas. Heat often brings more relief post needling. You can continue your regular activities, but vigorous activity is not recommended initially after the treatment for 24 hours. DN is best combined with other physical therapy such as strengthening, stretching, and other therapies.   What are the complications? While your therapist has had extensive training in minimizing the risks of trigger  point dry needling, it is important to understand the risks of any procedure.  Risks include bleeding, pain, fatigue, hematoma, infection, vertigo, nausea or nerve involvement. Monitor for any changes to your skin or sensation. Contact your therapist or MD with concerns.  A rare but serious complication is a pneumothorax over or near your middle and upper chest and back If you have dry needling in this area, monitor for the following symptoms: Shortness of breath on exertion and/or Difficulty taking a deep breath and/or Chest Pain and/or A dry cough If any of the above symptoms develop, please go to the nearest emergency room or call 911. Tell them you had dry needling over your thorax and report any symptoms you are having. Please follow-up with your treating therapist after you complete the medical evaluation.   [facility]  [address] [place] [address].  [phone]    Dilators: 1. Find a comfortable position and work on diaphragmatic breathing: you can lie on your back propped up on pillows and knees supported as a good first position to try 2. Start with your finger and a little bit of lubricant: gentle massage the vaginal entrance before use of dilator 3. Wash dilators before and after you use them; then place a little bit of lubricant along the dilator on all sides 4. Gently press the very tip of the dilator into the vagina - stop with any pain or discomfort 5. First goal is to get dilator inside vagina: you can use the back and forth (side  to side) movement to help gently press dilator forward. 6. Once the dilator is inside the vaginal canal, just lie there and take some deep breaths, read something, listen to music, etc - distract yourself! 7. Ways to progress: a. Bigger side to side b. Tilting c. Twisting d. Thrusting - does not have to be the length of the dilator at first - start with just a small movement 8. If you get stuck and  there's pain: leave the dilator where it is and try to ignore it - distract yourself and work on breathing. If you cannot get a dilator comfortable, go down in size. The primary goal is to make this a positive experience and end on a good note.   [facility] [address] [place], [place] [address] Phone # [phone] Fax [phone]

## 2023-09-03 NOTE — Therapy (Signed)
 OUTPATIENT PHYSICAL THERAPY FEMALE PELVIC TREATMENT   Patient Name: Annette Parks MRN: [id number] DOB:1990-09-10, 33 y.o., female Today's Date: 09/03/2023  END OF SESSION:  PT End of Session - 09/03/23 1522     Visit Number 4    Date for PT Re-Evaluation 12/11/23    Authorization Type [employer]    PT Start Time 1525    PT Stop Time 1610    PT Time Calculation (min) 45 min    Activity Tolerance Patient tolerated treatment well    Behavior During Therapy WFL for tasks assessed/performed             Past Medical History:  Diagnosis Date   Anal fissure    Hashimoto's disease    IBS (irritable bowel syndrome)    Migraine headache    Pelvic floor dysfunction?    Perforated eardrum    PONV (postoperative nausea and vomiting)    Pregnancy induced hypertension    UTI (urinary tract infection)    Vaginal Pap smear, abnormal    Past Surgical History:  Procedure Laterality Date   DILATION AND EVACUATION  09/06/2021   EAR TUBE REMOVAL     TONSILLECTOMY AND ADENOIDECTOMY     TYMPANOPLASTY Right    TYMPANOSTOMY TUBE PLACEMENT     x 3   WISDOM TOOTH EXTRACTION     Patient Active Problem List   Diagnosis Date Noted   Pregnancy 10/25/2022   Palpitations 01/18/2020   Closed fracture of fifth metacarpal bone of right hand 11/05/2019   Pain of right shoulder joint on movement 10/26/2019   Acute pain of right wrist 10/26/2019   Hashimoto's thyroiditis 09/04/2018   Hair loss 09/04/2018   Conductive hearing loss of right ear with unrestricted hearing of left ear 09/26/2017   Perforation of right tympanic membrane 09/26/2017   Hip pain, chronic, left 08/13/2017   Lower back pain 08/11/2017   Trochanteric bursitis of left hip 07/31/2017   Sacroiliac (ligament) sprain 07/31/2017   Common migraine 05/07/2016   Eye pain, bilateral 05/07/2016   Facial pain 05/07/2016   GAD (generalized anxiety disorder) 03/22/2015   Neck pain 07/11/2014   Myalgia 07/11/2014   Dysesthesia 07/11/2014     PCP: Kaylee Partridge, MD  REFERRING PROVIDER: Thurman Flores, MD   REFERRING DIAG: R10.2 (ICD-10-CM) - Pelvic and perineal pain  THERAPY DIAG:  Muscle weakness (generalized)  Other lack of coordination  Cramp and spasm  Rationale for Evaluation and Treatment: Rehabilitation  ONSET DATE: 09/2022  SUBJECTIVE:  SUBJECTIVE STATEMENT: I have not been doing the home program due to my son being sick then having surgery. I used the wand external and it was uncomfortable. Sitting cross legged does not hurt. I am able to take long walks without pain.   PAIN:  Are you having pain? Yes NPRS scale: 8/10 Pain location: Vaginal  Pain type: burning and tight  Pain description: intermittent   Aggravating factors: penile penetration and would last for days, walking and feels some swelling Relieving factors: no penile penetration  PRECAUTIONS: None  RED FLAGS: None   WEIGHT BEARING RESTRICTIONS: No  FALLS:  Has patient fallen in last 6 months? No  OCCUPATION: nurse  ACTIVITY LEVEL : none  PLOF: Independent  PATIENT GOALS: reduce pain for penile penetration, reduce incontinence  PERTINENT HISTORY:  Anal fissure; IBS Sexual abuse: No  BOWEL MOVEMENT: no issues Pain with bowel movement: No  URINATION: Pain with urination: No Fully empty bladder: Yes:   Stream:  strong and will angle to the left or right Urgency: No Frequency: average Leakage: Coughing, Sneezing, and Exercise Pads: Yes: wears more for sweating in the vaginal area  INTERCOURSE:  Ability to have vaginal penetration Yes.  Prior to birthing her child but has not had intercourse since then Pain with intercourse: Initial Penetration, During Penetration, Deep Penetration, and After Intercourse DrynessNo Climax:  yes Marinoff Scale: 3/3  PREGNANCY: Vaginal deliveries 1 Tearing Yes: tore in 3 spots internally and by the urethra Currently pregnant No    OBJECTIVE:  Note: Objective measures were completed at Evaluation unless otherwise noted.  DIAGNOSTIC FINDINGS:  none    COGNITION: Overall cognitive status: Within functional limits for tasks assessed     SENSATION: Light touch: Appears intact   POSTURE: No Significant postural limitations   LUMBARAROM/PROM:  A/PROM A/PROM  eval  Flexion full  Extension Decreased by 50%  Right lateral flexion Decreased by 25%  Left lateral flexion full  Right rotation Decreased by 25%  Left rotation full   (Blank rows = not tested)  LOWER EXTREMITY ROM: full bilateral hip ROM   LOWER EXTREMITY MMT:  MMT Right eval Left eval  Hip extension 4/5 4/5  Hip abduction 3/5 3/5   (Blank rows = not tested) PALPATION:   General: tenderness located in the lumbar, decreased movement of the lumbar vertebrae  Pelvic Alignment: Decreased movement of the right SI joint; Right ilium rotated anteriorly; sacrum rotated to the left  Abdominal: Good abdominal contraction                External Perineal Exam: tender in the bulbocavernosus, ischiocavernosus, perineal body, decreased mobility of the perineal body, vulvar area is pale, decreased sensation along the 11:00  to 1:00; two scars that are tender along the posterior right latera area that is very tender                             Internal Pelvic Floor: tenderness throughout the pelvic floor, decreased mobility of the posterior canal and decreased sensation.  Patient confirms identification and approves PT to assess internal pelvic floor and treatment Yes  PELVIC MMT:   MMT eval  Vaginal 3/5 but very small lift  Diastasis Recti 1 finger width below umbilicus  (Blank rows = not tested)        TONE: increased   TODAY'S TREATMENT:  09/03/23 Manual: Soft tissue mobilization: Manual  work along the perineal body to lengthen Internal  pelvic floor techniques: No emotional/communication barriers or cognitive limitation. Patient is motivated to learn. Patient understands and agrees with treatment goals and plan. PT explains patient will be examined in standing, sitting, and lying down to see how their muscles and joints work. When they are ready, they will be asked to remove their underwear so PT can examine their perineum. The patient is also given the option of providing their own chaperone as one is not provided in our facility. The patient also has the right and is explained the right to defer or refuse any part of the evaluation or treatment including the internal exam. With the patient's consent, PT will use one gloved finger to gently assess the muscles of the pelvic floor, seeing how well it contracts and relaxes and if there is muscle symmetry. After, the patient will get dressed and PT and patient will discuss exam findings and plan of care. PT and patient discuss plan of care, schedule, attendance policy and HEP activities.  Therapist gloved finger in the vaginal canal working on the introitus especially the right where there was a band of tissue is taut. Manual work with tissue between two finger, sweeping motion of the introitus,  Therapist gloved finger in the introitus 3 inches for the first time and did manual work while patient was working on diaphragmatic breathing to feel the pelvic floor drop.  Dry needling: Trigger Point Dry Needling  Initial Treatment: Pt instructed on Dry Needling rational, procedures, and possible side effects. Pt instructed to expect mild to moderate muscle soreness later in the day and/or into the next day.  Pt instructed in methods to reduce muscle soreness. Pt instructed to continue prescribed HEP. Because Dry Needling was performed over or adjacent to a lung field, pt was educated on S/S of pneumothorax and to seek immediate medical attention  should they occur.  Patient was educated on signs and symptoms of infection and other risk factors and advised to seek medical attention should they occur.  Patient verbalized understanding of these instructions and education.   Patient Verbal Consent Given: Yes Education Handout Provided: Yes Muscles Treated: perineal body 1 time then stopped due to being painful Electrical Stimulation Performed: No Treatment Response/Outcome: elongation of muscle and trigger point response Self-care: Educated patient on how to use the wand to massage the external pelvic floor and her massaging herself Educated patient on how to use the smallest size dilator to work on expansion of the vaginal canal    07/04/23 Manual: Myofascial release: Fascial release around the gluteals, hamstring, hip adductors, lumbar, and quadriceps to improve fascial mobility Exercises: Stretches/mobility: Sitting piriformis stretch holding 30 sec bil.  Sitting hamstring stretch holding 30 sec bil.  Sitting bilateral hip internal rotation  Sitting with hand between knees moving thighs forward and back Self-care: Educated patient on how to use the vaginal wand on the perineal area and vulvar area to reduce fascial restriction Educated patient    06/20/23 Manual: Soft tissue mobilization: Manual work to the lumbar paraspinals and lateral abdominal, and quadratus Manual work to the diaphragm in quadruped and supine to elongate the tissue for breath Quadruped with pulling tissue from the spine to the abdomen, pulling the tissue from the pubic bone with rocking back and forth .  Myofascial release: Tissue rolling of the lumbar and posterior rib cage and lower anterior rib cage Spinal mobilization: Gapping of the lumbar facets from L3-5 both sides laying on the side grade 3 Neuromuscular re-education: Down training: Supine diaphragmatic  breathing with tactile cues to expand the lower rib cage Childs pose with diaphragmatic  breathing focusing on the posterior rib cage Laying on side with stretching the lateral rib cage and breathing into the lateral rib cage Sitting trunk rotation to elongate the spine after mobilization Self-care: Assisted patient on ordering her dilators and vaginal wand to work on the pelvic floor to elongate the tissue and reduce trigger points.                                                                                                                                 DATE: 06/13/23  EVAL see below   PATIENT EDUCATION:  07/04/23 Education details: education on vaginal dilators and wand to improve elasticity of the tissue; Access Code: [email or web address] Person educated: Patient Education method: Explanation, Demonstration, Tactile cues, Verbal cues, and Handouts Education comprehension: verbalized understanding, returned demonstration, verbal cues required, tactile cues required, and needs further education  HOME EXERCISE PROGRAM: 07/04/23 Access Code: [email or web address] URL: [email or web address] Date: 07/04/2023 Prepared by: Marsha Skeen  Program Notes sitting trunk rotation holding 30 sec both ways. sit on ball and massage the pelvic floor for 3-5 minutes per day. sitting hand between knees and move thighs forward and back  Exercises - Diaphragmatic Breathing in Child's Pose with Pelvic Floor Relaxation  - 1 x daily - 7 x weekly - 1 sets - 10 reps - Seated Lateral Trunk Stretch on Swiss Ball  - 1 x daily - 7 x weekly - 1 sets - 2 reps - 15 sec hold - Seated Piriformis Stretch with Trunk Bend  - 1 x daily - 7 x weekly - 1 sets - 1 reps - 30 sec hold - Seated Hamstring Stretch  - 1 x daily - 7 x weekly - 1 sets - 1 reps - 30 sec hold - Seated Hip Internal Rotation with Ball and Resistance  - 1 x daily - 7 x weekly - 1 sets - 10 reps   ASSESSMENT:  CLINICAL IMPRESSION: Patient is a 33 y.o. female who was seen today for physical therapy  treatment for pelvic and perineal pain. Patient  is able to sit cross legged and take long walks without pain.   Pelvic floor strength is 1/5 with difficulty relaxation. Patient is now ready to use the vaginal dilators. She was able to tolerate the therapist putting her finger in 3 inches into the introitus. Patient understand how to perform manual work in the introitus. Patient will benefit from skilled therapy to improve pelvic floor strength and coordination and reduce her pain.   OBJECTIVE IMPAIRMENTS: decreased coordination, decreased endurance, decreased ROM, decreased strength, increased fascial restrictions, increased muscle spasms, and pain.   ACTIVITY LIMITATIONS: continence, toileting, and locomotion level  PARTICIPATION LIMITATIONS: interpersonal relationship, driving, shopping, and community activity  PERSONAL FACTORS: Time since onset of injury/illness/exacerbation are also affecting patient's functional outcome.   REHAB POTENTIAL: Excellent  CLINICAL DECISION MAKING: Evolving/moderate complexity  EVALUATION COMPLEXITY: Moderate   GOALS: Goals reviewed with patient? Yes  SHORT TERM GOALS: Target date: 07/11/23  Patient educated on how to use her cream vaginally.  Baseline: Goal status: Met 07/04/23  2.  Patient educated on how to massage the perineal body and scars around the area to reduce pain.  Baseline:  Goal status: Met 07/04/23  3.  Patient independent with initial  HEP for hip stretches and diaphragmatic breathing to lengthen the pelvic floor.  Baseline:  Goal status: Met 09/03/23  4.  Patient educate on ways to manage her pelvic pain with vaginal penetration.  Baseline:  Goal status: INITIAL    LONG TERM GOALS: Target date: 12/11/23  Patient independent with advanced HEP for pelvic floor manual work and coordination.  Baseline:  Goal status: INITIAL  2.  Patient is using the vaginal dilators and on the largest one so she is able to have penile penetration vaginally.  Baseline:  Goal status:  INITIAL  3.  Patient reports urinary leakage with Coughing, Sneezing, and Exercise >/= 75%. Baseline:  Goal status: INITIAL  4.  Patient is able to walk for 45 minutes without vaginal pain due reduction of trigger points.  Baseline:  Goal status: INITIAL   PLAN:  PT FREQUENCY: 1-2x/week  PT DURATION: 6 months  PLANNED INTERVENTIONS: 97110-Therapeutic exercises, 97530- Therapeutic activity, [id number]- Neuromuscular re-education, 97535- Self Care, 16109- Manual therapy, [phone]- Aquatic Therapy, [id number]- Electrical stimulation (unattended), 97035- Ultrasound, Patient/Family education, Dry Needling, Joint mobilization, Spinal mobilization, Cryotherapy, Moist heat, and Biofeedback  PLAN FOR NEXT SESSION: manual work internally and around the introitus to the pelvic floor,  deep squat, review dilators and progression, work on diaphragmatic breathing.    Marsha Skeen, PT 09/03/23 5:01 PM

## 2023-09-12 ENCOUNTER — Encounter: Payer: Self-pay | Admitting: Physical Therapy

## 2023-09-12 ENCOUNTER — Ambulatory Visit: Payer: Commercial Managed Care - PPO | Admitting: Physical Therapy

## 2023-09-12 DIAGNOSIS — R252 Cramp and spasm: Secondary | ICD-10-CM

## 2023-09-12 DIAGNOSIS — M6281 Muscle weakness (generalized): Secondary | ICD-10-CM

## 2023-09-12 DIAGNOSIS — R278 Other lack of coordination: Secondary | ICD-10-CM | POA: Diagnosis not present

## 2023-09-12 NOTE — Therapy (Signed)
 OUTPATIENT PHYSICAL THERAPY FEMALE PELVIC TREATMENT   Patient Name: Annette Parks MRN: [id number] DOB:Jan 05, 1991, 33 y.o., female Today's Date: 09/12/2023  END OF SESSION:  PT End of Session - 09/12/23 0848     Visit Number 5    Date for PT Re-Evaluation 12/11/23    Authorization Type cone    PT Start Time 0845    PT Stop Time 0925    PT Time Calculation (min) 40 min    Activity Tolerance Patient tolerated treatment well    Behavior During Therapy WFL for tasks assessed/performed             Past Medical History:  Diagnosis Date   Anal fissure    Hashimoto's disease    IBS (irritable bowel syndrome)    Migraine headache    Pelvic floor dysfunction?    Perforated eardrum    PONV (postoperative nausea and vomiting)    Pregnancy induced hypertension    UTI (urinary tract infection)    Vaginal Pap smear, abnormal    Past Surgical History:  Procedure Laterality Date   DILATION AND EVACUATION  09/06/2021   EAR TUBE REMOVAL     TONSILLECTOMY AND ADENOIDECTOMY     TYMPANOPLASTY Right    TYMPANOSTOMY TUBE PLACEMENT     x 3   WISDOM TOOTH EXTRACTION     Patient Active Problem List   Diagnosis Date Noted   Pregnancy 10/25/2022   Palpitations 01/18/2020   Closed fracture of fifth metacarpal bone of right hand 11/05/2019   Pain of right shoulder joint on movement 10/26/2019   Acute pain of right wrist 10/26/2019   Hashimoto's thyroiditis 09/04/2018   Hair loss 09/04/2018   Conductive hearing loss of right ear with unrestricted hearing of left ear 09/26/2017   Perforation of right tympanic membrane 09/26/2017   Hip pain, chronic, left 08/13/2017   Lower back pain 08/11/2017   Trochanteric bursitis of left hip 07/31/2017   Sacroiliac (ligament) sprain 07/31/2017   Common migraine 05/07/2016   Eye pain, bilateral 05/07/2016   Facial pain 05/07/2016   GAD (generalized anxiety disorder) 03/22/2015   Neck pain 07/11/2014   Myalgia 07/11/2014   Dysesthesia 07/11/2014     PCP: Kaylee Partridge, MD  REFERRING PROVIDER: Thurman Flores, MD   REFERRING DIAG: R10.2 (ICD-10-CM) - Pelvic and perineal pain  THERAPY DIAG:  Muscle weakness (generalized)  Other lack of coordination  Cramp and spasm  Rationale for Evaluation and Treatment: Rehabilitation  ONSET DATE: 09/2022  SUBJECTIVE:  SUBJECTIVE STATEMENT: I was on my cycle. Urinary leakage is 80% better.   PAIN:  Are you having pain? Yes NPRS scale: 8/10 Pain location: Vaginal  Pain type: burning and tight  Pain description: intermittent   Aggravating factors: penile penetration and would last for days, walking and feels some swelling Relieving factors: no penile penetration  PRECAUTIONS: None  RED FLAGS: None   WEIGHT BEARING RESTRICTIONS: No  FALLS:  Has patient fallen in last 6 months? No  OCCUPATION: nurse  ACTIVITY LEVEL : none  PLOF: Independent  PATIENT GOALS: reduce pain for penile penetration, reduce incontinence  PERTINENT HISTORY:  Anal fissure; IBS Sexual abuse: No  BOWEL MOVEMENT: no issues Pain with bowel movement: No  URINATION: Pain with urination: No Fully empty bladder: Yes:   Stream: strong and will angle to the left or right Urgency: No Frequency: average Leakage: Coughing, Sneezing, and Exercise Pads: Yes: wears more for sweating in the vaginal area  INTERCOURSE:  Ability to have vaginal penetration Yes.  Prior to birthing her child but has not had intercourse since then Pain with intercourse: Initial Penetration, During Penetration, Deep Penetration, and After Intercourse DrynessNo Climax: yes Marinoff Scale: 3/3  PREGNANCY: Vaginal deliveries 1 Tearing Yes: tore in 3 spots internally and by the urethra Currently pregnant No    OBJECTIVE:  Note:  Objective measures were completed at Evaluation unless otherwise noted.  DIAGNOSTIC FINDINGS:  none    COGNITION: Overall cognitive status: Within functional limits for tasks assessed     SENSATION: Light touch: Appears intact   POSTURE: No Significant postural limitations   LUMBARAROM/PROM:  A/PROM A/PROM  eval  Flexion full  Extension Decreased by 50%  Right lateral flexion Decreased by 25%  Left lateral flexion full  Right rotation Decreased by 25%  Left rotation full   (Blank rows = not tested)  LOWER EXTREMITY ROM: full bilateral hip ROM   LOWER EXTREMITY MMT:  MMT Right eval Left eval  Hip extension 4/5 4/5  Hip abduction 3/5 3/5   (Blank rows = not tested) PALPATION:   General: tenderness located in the lumbar, decreased movement of the lumbar vertebrae  Pelvic Alignment: Decreased movement of the right SI joint; Right ilium rotated anteriorly; sacrum rotated to the left  Abdominal: Good abdominal contraction                External Perineal Exam: tender in the bulbocavernosus, ischiocavernosus, perineal body, decreased mobility of the perineal body, vulvar area is pale, decreased sensation along the 11:00  to 1:00; two scars that are tender along the posterior right latera area that is very tender                             Internal Pelvic Floor: tenderness throughout the pelvic floor, decreased mobility of the posterior canal and decreased sensation.  Patient confirms identification and approves PT to assess internal pelvic floor and treatment Yes  PELVIC MMT:   MMT eval 09/12/23  Vaginal 3/5 but very small lift 2/5 with hug of therapist finger  Diastasis Recti 1 finger width below umbilicus   (Blank rows = not tested)        TONE: increased   TODAY'S TREATMENT:  09/12/23 Manual: Internal pelvic floor techniques: No emotional/communication barriers or cognitive limitation. Patient is motivated to learn. Patient understands and agrees with  treatment goals and plan. PT explains patient will be examined in standing, sitting, and lying down  to see how their muscles and joints work. When they are ready, they will be asked to remove their underwear so PT can examine their perineum. The patient is also given the option of providing their own chaperone as one is not provided in our facility. The patient also has the right and is explained the right to defer or refuse any part of the evaluation or treatment including the internal exam. With the patient's consent, PT will use one gloved finger to gently assess the muscles of the pelvic floor, seeing how well it contracts and relaxes and if there is muscle symmetry. After, the patient will get dressed and PT and patient will discuss exam findings and plan of care. PT and patient discuss plan of care, schedule, attendance policy and HEP activities.  Therapist gloved finger in the vaginal canal working with other finger to perform manual work to the urogenital diaphragm, pubovaginalis, bulbocavernosus, perineal body and along the introitus. Therapist was able to place her whole gloved index finger into the vaginal canal for first time Neuromuscular re-education: Down training: Supine with feet on wall and place in a squat position and therapist uses the addaday machine to further lengthen the pelvic floor Squat on 8 inch step with 4 inch on top but no stretch Long leg lunge on both sides with rotating foot to further stretch the inner thigh and pelvic floor.  Exercises: Stretches/mobility: Hamstring stretch supine holding 30 sec bilaterally Pull leg across the body and hold 30 sec bilaterally    09/03/23 Manual: Soft tissue mobilization: Manual work along the perineal body to lengthen Internal pelvic floor techniques: No emotional/communication barriers or cognitive limitation. Patient is motivated to learn. Patient understands and agrees with treatment goals and plan. PT explains patient will be  examined in standing, sitting, and lying down to see how their muscles and joints work. When they are ready, they will be asked to remove their underwear so PT can examine their perineum. The patient is also given the option of providing their own chaperone as one is not provided in our facility. The patient also has the right and is explained the right to defer or refuse any part of the evaluation or treatment including the internal exam. With the patient's consent, PT will use one gloved finger to gently assess the muscles of the pelvic floor, seeing how well it contracts and relaxes and if there is muscle symmetry. After, the patient will get dressed and PT and patient will discuss exam findings and plan of care. PT and patient discuss plan of care, schedule, attendance policy and HEP activities.  Therapist gloved finger in the vaginal canal working on the introitus especially the right where there was a band of tissue is taut. Manual work with tissue between two finger, sweeping motion of the introitus,  Therapist gloved finger in the introitus 3 inches for the first time and did manual work while patient was working on diaphragmatic breathing to feel the pelvic floor drop.  Dry needling: Trigger Point Dry Needling  Initial Treatment: Pt instructed on Dry Needling rational, procedures, and possible side effects. Pt instructed to expect mild to moderate muscle soreness later in the day and/or into the next day.  Pt instructed in methods to reduce muscle soreness. Pt instructed to continue prescribed HEP. Because Dry Needling was performed over or adjacent to a lung field, pt was educated on S/S of pneumothorax and to seek immediate medical attention should they occur.  Patient was educated on signs  and symptoms of infection and other risk factors and advised to seek medical attention should they occur.  Patient verbalized understanding of these instructions and education.   Patient Verbal Consent  Given: Yes Education Handout Provided: Yes Muscles Treated: perineal body 1 time then stopped due to being painful Electrical Stimulation Performed: No Treatment Response/Outcome: elongation of muscle and trigger point response Self-care: Educated patient on how to use the wand to massage the external pelvic floor and her massaging herself Educated patient on how to use the smallest size dilator to work on expansion of the vaginal canal    07/04/23 Manual: Myofascial release: Fascial release around the gluteals, hamstring, hip adductors, lumbar, and quadriceps to improve fascial mobility Exercises: Stretches/mobility: Sitting piriformis stretch holding 30 sec bil.  Sitting hamstring stretch holding 30 sec bil.  Sitting bilateral hip internal rotation  Sitting with hand between knees moving thighs forward and back Self-care: Educated patient on how to use the vaginal wand on the perineal area and vulvar area to reduce fascial restriction Educated patient      PATIENT EDUCATION:  07/04/23 Education details: education on vaginal dilators and wand to improve elasticity of the tissue; Access Code: [email or web address] Person educated: Patient Education method: Explanation, Demonstration, Tactile cues, Verbal cues, and Handouts Education comprehension: verbalized understanding, returned demonstration, verbal cues required, tactile cues required, and needs further education  HOME EXERCISE PROGRAM: 07/04/23 Access Code: [email or web address] URL: [email or web address] Date: 07/04/2023 Prepared by: Marsha Skeen  Program Notes sitting trunk rotation holding 30 sec both ways. sit on ball and massage the pelvic floor for 3-5 minutes per day. sitting hand between knees and move thighs forward and back  Exercises - Diaphragmatic Breathing in Child's Pose with Pelvic Floor Relaxation  - 1 x daily - 7 x weekly - 1 sets - 10 reps - Seated Lateral Trunk Stretch on Swiss Ball  - 1 x daily - 7 x weekly - 1  sets - 2 reps - 15 sec hold - Seated Piriformis Stretch with Trunk Bend  - 1 x daily - 7 x weekly - 1 sets - 1 reps - 30 sec hold - Seated Hamstring Stretch  - 1 x daily - 7 x weekly - 1 sets - 1 reps - 30 sec hold - Seated Hip Internal Rotation with Ball and Resistance  - 1 x daily - 7 x weekly - 1 sets - 10 reps   ASSESSMENT:  CLINICAL IMPRESSION: Patient is a 33 y.o. female who was seen today for physical therapy  treatment for pelvic and perineal pain. Urinary leakage is 80% better.   I am able to walk without vaginal pain. She  gets vaginal pain with squatting. She was able to have the therapist place her whole gloved index finger into the vaginal canal for first time. Her right side of the introitus was tighter than the left.  Pelvic floor strength is 2/5 with hug of therapist finger. Patient will benefit from skilled therapy to improve pelvic floor strength and coordination and reduce her pain.   OBJECTIVE IMPAIRMENTS: decreased coordination, decreased endurance, decreased ROM, decreased strength, increased fascial restrictions, increased muscle spasms, and pain.   ACTIVITY LIMITATIONS: continence, toileting, and locomotion level  PARTICIPATION LIMITATIONS: interpersonal relationship, driving, shopping, and community activity  PERSONAL FACTORS: Time since onset of injury/illness/exacerbation are also affecting patient's functional outcome.   REHAB POTENTIAL: Excellent  CLINICAL DECISION MAKING: Evolving/moderate complexity  EVALUATION COMPLEXITY: Moderate   GOALS: Goals reviewed with patient? Yes  SHORT TERM GOALS: Target  date: 07/11/23  Patient educated on how to use her cream vaginally.  Baseline: Goal status: Met 07/04/23  2.  Patient educated on how to massage the perineal body and scars around the area to reduce pain.  Baseline:  Goal status: Met 07/04/23  3.  Patient independent with initial  HEP for hip stretches and diaphragmatic breathing to lengthen the pelvic  floor.  Baseline:  Goal status: Met 09/03/23  4.  Patient educate on ways to manage her pelvic pain with vaginal penetration.  Baseline:  Goal status: INITIAL    LONG TERM GOALS: Target date: 12/11/23  Patient independent with advanced HEP for pelvic floor manual work and coordination.  Baseline:  Goal status: INITIAL  2.  Patient is using the vaginal dilators and on the largest one so she is able to have penile penetration vaginally.  Baseline:  Goal status: INITIAL  3.  Patient reports urinary leakage with Coughing, Sneezing, and Exercise >/= 75%. Baseline:  Goal status: INITIAL  4.  Patient is able to walk for 45 minutes without vaginal pain due reduction of trigger points.  Baseline:  Goal status: Met 09/12/23   PLAN:  PT FREQUENCY: 1-2x/week  PT DURATION: 6 months  PLANNED INTERVENTIONS: 97110-Therapeutic exercises, 97530- Therapeutic activity, 97112- Neuromuscular re-education, 97535- Self Care, [id number]- Manual therapy, [phone]- Aquatic Therapy, [id number]- Electrical stimulation (unattended), 97035- Ultrasound, Patient/Family education, Dry Needling, Joint mobilization, Spinal mobilization, Cryotherapy, Moist heat, and Biofeedback  PLAN FOR NEXT SESSION: manual work internally and around the introitus to the pelvic floor,  work on diaphragmatic breathing.    Marsha Skeen, PT 09/12/23 9:30 AM

## 2023-09-19 ENCOUNTER — Ambulatory Visit: Payer: Commercial Managed Care - PPO | Admitting: Physical Therapy

## 2023-10-01 ENCOUNTER — Ambulatory Visit: Admitting: Physical Therapy

## 2023-10-02 ENCOUNTER — Ambulatory Visit: Payer: 59 | Admitting: Internal Medicine

## 2023-11-17 ENCOUNTER — Ambulatory Visit: Attending: Obstetrics and Gynecology | Admitting: Physical Therapy

## 2023-11-17 ENCOUNTER — Encounter: Payer: Self-pay | Admitting: Physical Therapy

## 2023-11-17 DIAGNOSIS — M6281 Muscle weakness (generalized): Secondary | ICD-10-CM | POA: Diagnosis not present

## 2023-11-17 DIAGNOSIS — R278 Other lack of coordination: Secondary | ICD-10-CM | POA: Diagnosis not present

## 2023-11-17 DIAGNOSIS — R252 Cramp and spasm: Secondary | ICD-10-CM | POA: Insufficient documentation

## 2023-11-17 NOTE — Therapy (Signed)
 OUTPATIENT PHYSICAL THERAPY FEMALE PELVIC TREATMENT   Patient Name: Annette Parks MRN: [id number] DOB:12/28/1990, 33 y.o., female Today's Date: 11/17/2023  END OF SESSION:  PT End of Session - 11/17/23 1611     Visit Number 6    Date for PT Re-Evaluation 12/11/23    Authorization Type cone    PT Start Time 1611    PT Stop Time 1650    PT Time Calculation (min) 39 min    Activity Tolerance Patient tolerated treatment well    Behavior During Therapy WFL for tasks assessed/performed          Past Medical History:  Diagnosis Date   Anal fissure    Hashimoto's disease    IBS (irritable bowel syndrome)    Migraine headache    Pelvic floor dysfunction?    Perforated eardrum    PONV (postoperative nausea and vomiting)    Pregnancy induced hypertension    UTI (urinary tract infection)    Vaginal Pap smear, abnormal    Past Surgical History:  Procedure Laterality Date   DILATION AND EVACUATION  09/06/2021   EAR TUBE REMOVAL     TONSILLECTOMY AND ADENOIDECTOMY     TYMPANOPLASTY Right    TYMPANOSTOMY TUBE PLACEMENT     x 3   WISDOM TOOTH EXTRACTION     Patient Active Problem List   Diagnosis Date Noted   Pregnancy 10/25/2022   Palpitations 01/18/2020   Closed fracture of fifth metacarpal bone of right hand 11/05/2019   Pain of right shoulder joint on movement 10/26/2019   Acute pain of right wrist 10/26/2019   Hashimoto's thyroiditis 09/04/2018   Hair loss 09/04/2018   Conductive hearing loss of right ear with unrestricted hearing of left ear 09/26/2017   Perforation of right tympanic membrane 09/26/2017   Hip pain, chronic, left 08/13/2017   Lower back pain 08/11/2017   Trochanteric bursitis of left hip 07/31/2017   Sacroiliac (ligament) sprain 07/31/2017   Common migraine 05/07/2016   Eye pain, bilateral 05/07/2016   Facial pain 05/07/2016   GAD (generalized anxiety disorder) 03/22/2015   Neck pain 07/11/2014   Myalgia 07/11/2014   Dysesthesia 07/11/2014     PCP: Watt Harlene BROCKS, MD  REFERRING PROVIDER: Mat Browning, MD   REFERRING DIAG: R10.2 (ICD-10-CM) - Pelvic and perineal pain  THERAPY DIAG:  Muscle weakness (generalized)  Other lack of coordination  Cramp and spasm  Rationale for Evaluation and Treatment: Rehabilitation  ONSET DATE: 09/2022  SUBJECTIVE:  SUBJECTIVE STATEMENT: I have no urinary leakage. Not having a lot of pain. I have tried to use the wand and it was not that painful. No intercourse.   PAIN:  Are you having pain? Yes NPRS scale: 8/10 Pain location: Vaginal  Pain type: burning and tight  Pain description: intermittent   Aggravating factors: penile penetration and would last for days, walking and feels some swelling Relieving factors: no penile penetration  PRECAUTIONS: None  RED FLAGS: None   WEIGHT BEARING RESTRICTIONS: No  FALLS:  Has patient fallen in last 6 months? No  OCCUPATION: nurse  ACTIVITY LEVEL : none  PLOF: Independent  PATIENT GOALS: reduce pain for penile penetration, reduce incontinence  PERTINENT HISTORY:  Anal fissure; IBS Sexual abuse: No  BOWEL MOVEMENT: no issues Pain with bowel movement: No  URINATION: Pain with urination: No Fully empty bladder: Yes:   Stream: strong and will angle to the left or right Urgency: No Frequency: average Leakage: Coughing, Sneezing, and Exercise Pads: Yes: wears more for sweating in the vaginal area  INTERCOURSE:  Ability to have vaginal penetration Yes.  Prior to birthing her child but has not had intercourse since then Pain with intercourse: Initial Penetration, During Penetration, Deep Penetration, and After Intercourse DrynessNo Climax: yes Marinoff Scale: 3/3  PREGNANCY: Vaginal deliveries 1 Tearing Yes: tore in 3 spots  internally and by the urethra Currently pregnant No    OBJECTIVE:  Note: Objective measures were completed at Evaluation unless otherwise noted.  DIAGNOSTIC FINDINGS:  none    COGNITION: Overall cognitive status: Within functional limits for tasks assessed     SENSATION: Light touch: Appears intact   POSTURE: No Significant postural limitations   LUMBARAROM/PROM:  A/PROM A/PROM  eval  Flexion full  Extension Decreased by 50%  Right lateral flexion Decreased by 25%  Left lateral flexion full  Right rotation Decreased by 25%  Left rotation full   (Blank rows = not tested)  LOWER EXTREMITY ROM: full bilateral hip ROM   LOWER EXTREMITY MMT:  MMT Right eval Left eval  Hip extension 4/5 4/5  Hip abduction 3/5 3/5   (Blank rows = not tested) PALPATION:   General: tenderness located in the lumbar, decreased movement of the lumbar vertebrae  Pelvic Alignment: Decreased movement of the right SI joint; Right ilium rotated anteriorly; sacrum rotated to the left  Abdominal: Good abdominal contraction                External Perineal Exam: tender in the bulbocavernosus, ischiocavernosus, perineal body, decreased mobility of the perineal body, vulvar area is pale, decreased sensation along the 11:00  to 1:00; two scars that are tender along the posterior right latera area that is very tender                             Internal Pelvic Floor: tenderness throughout the pelvic floor, decreased mobility of the posterior canal and decreased sensation.  Patient confirms identification and approves PT to assess internal pelvic floor and treatment Yes  PELVIC MMT:   MMT eval 09/12/23 11/17/23  Vaginal 3/5 but very small lift 2/5 with hug of therapist finger 3/5  Diastasis Recti 1 finger width below umbilicus    (Blank rows = not tested)        TONE: increased   TODAY'S TREATMENT:  11/17/23 Manual: Soft tissue mobilization: Manual work to the perineal body to lengthen  the tissue and improve the mobility  Internal pelvic floor techniques: No emotional/communication barriers or cognitive limitation. Patient is motivated to learn. Patient understands and agrees with treatment goals and plan. PT explains patient will be examined in standing, sitting, and lying down to see how their muscles and joints work. When they are ready, they will be asked to remove their underwear so PT can examine their perineum. The patient is also given the option of providing their own chaperone as one is not provided in our facility. The patient also has the right and is explained the right to defer or refuse any part of the evaluation or treatment including the internal exam. With the patient's consent, PT will use one gloved finger to gently assess the muscles of the pelvic floor, seeing how well it contracts and relaxes and if there is muscle symmetry. After, the patient will get dressed and PT and patient will discuss exam findings and plan of care. PT and patient discuss plan of care, schedule, attendance policy and HEP activities.  Going through the vaginal canal working on the levator ani, along the perineal body, along the posterior fourchette, along the superior transverse, along the Alcock's canal while laying on right side Neuromuscular re-education: Pelvic floor contraction training: Therapist gloved finger in the vaginal canal working on pelvic floor contraction with a lift with tactile cues on the coccyx.  Self-care: Educated patient on how to use vaginal dilators, how to progress them, ways to move the dilators around and stretch the vaginal opening and how to clean them.     09/12/23 Manual: Internal pelvic floor techniques: No emotional/communication barriers or cognitive limitation. Patient is motivated to learn. Patient understands and agrees with treatment goals and plan. PT explains patient will be examined in standing, sitting, and lying down to see how their muscles and  joints work. When they are ready, they will be asked to remove their underwear so PT can examine their perineum. The patient is also given the option of providing their own chaperone as one is not provided in our facility. The patient also has the right and is explained the right to defer or refuse any part of the evaluation or treatment including the internal exam. With the patient's consent, PT will use one gloved finger to gently assess the muscles of the pelvic floor, seeing how well it contracts and relaxes and if there is muscle symmetry. After, the patient will get dressed and PT and patient will discuss exam findings and plan of care. PT and patient discuss plan of care, schedule, attendance policy and HEP activities.  Therapist gloved finger in the vaginal canal working with other finger to perform manual work to the urogenital diaphragm, pubovaginalis, bulbocavernosus, perineal body and along the introitus. Therapist was able to place her whole gloved index finger into the vaginal canal for first time Neuromuscular re-education: Down training: Supine with feet on wall and place in a squat position and therapist uses the addaday machine to further lengthen the pelvic floor Squat on 8 inch step with 4 inch on top but no stretch Long leg lunge on both sides with rotating foot to further stretch the inner thigh and pelvic floor.  Exercises: Stretches/mobility: Hamstring stretch supine holding 30 sec bilaterally Pull leg across the body and hold 30 sec bilaterally    09/03/23 Manual: Soft tissue mobilization: Manual work along the perineal body to lengthen Internal pelvic floor techniques: No emotional/communication barriers or cognitive limitation. Patient is motivated to learn. Patient understands and agrees with treatment goals and  plan. PT explains patient will be examined in standing, sitting, and lying down to see how their muscles and joints work. When they are ready, they will be asked  to remove their underwear so PT can examine their perineum. The patient is also given the option of providing their own chaperone as one is not provided in our facility. The patient also has the right and is explained the right to defer or refuse any part of the evaluation or treatment including the internal exam. With the patient's consent, PT will use one gloved finger to gently assess the muscles of the pelvic floor, seeing how well it contracts and relaxes and if there is muscle symmetry. After, the patient will get dressed and PT and patient will discuss exam findings and plan of care. PT and patient discuss plan of care, schedule, attendance policy and HEP activities.  Therapist gloved finger in the vaginal canal working on the introitus especially the right where there was a band of tissue is taut. Manual work with tissue between two finger, sweeping motion of the introitus,  Therapist gloved finger in the introitus 3 inches for the first time and did manual work while patient was working on diaphragmatic breathing to feel the pelvic floor drop.  Dry needling: Trigger Point Dry Needling  Initial Treatment: Pt instructed on Dry Needling rational, procedures, and possible side effects. Pt instructed to expect mild to moderate muscle soreness later in the day and/or into the next day.  Pt instructed in methods to reduce muscle soreness. Pt instructed to continue prescribed HEP. Because Dry Needling was performed over or adjacent to a lung field, pt was educated on S/S of pneumothorax and to seek immediate medical attention should they occur.  Patient was educated on signs and symptoms of infection and other risk factors and advised to seek medical attention should they occur.  Patient verbalized understanding of these instructions and education.   Patient Verbal Consent Given: Yes Education Handout Provided: Yes Muscles Treated: perineal body 1 time then stopped due to being painful Electrical  Stimulation Performed: No Treatment Response/Outcome: elongation of muscle and trigger point response Self-care: Educated patient on how to use the wand to massage the external pelvic floor and her massaging herself Educated patient on how to use the smallest size dilator to work on expansion of the vaginal canal    07/04/23 Manual: Myofascial release: Fascial release around the gluteals, hamstring, hip adductors, lumbar, and quadriceps to improve fascial mobility Exercises: Stretches/mobility: Sitting piriformis stretch holding 30 sec bil.  Sitting hamstring stretch holding 30 sec bil.  Sitting bilateral hip internal rotation  Sitting with hand between knees moving thighs forward and back Self-care: Educated patient on how to use the vaginal wand on the perineal area and vulvar area to reduce fascial restriction      PATIENT EDUCATION:  07/04/23 Education details: education on vaginal dilators and wand to improve elasticity of the tissue; Access Code: [email or web address] Person educated: Patient Education method: Explanation, Demonstration, Tactile cues, Verbal cues, and Handouts Education comprehension: verbalized understanding, returned demonstration, verbal cues required, tactile cues required, and needs further education  HOME EXERCISE PROGRAM: 07/04/23 Access Code: [email or web address] URL: [email or web address] Date: 07/04/2023 Prepared by: Channing Pereyra  Program Notes sitting trunk rotation holding 30 sec both ways. sit on ball and massage the pelvic floor for 3-5 minutes per day. sitting hand between knees and move thighs forward and back  Exercises - Diaphragmatic Breathing in Child's Pose with Pelvic Floor Relaxation  - 1  x daily - 7 x weekly - 1 sets - 10 reps - Seated Lateral Trunk Stretch on Swiss Ball  - 1 x daily - 7 x weekly - 1 sets - 2 reps - 15 sec hold - Seated Piriformis Stretch with Trunk Bend  - 1 x daily - 7 x weekly - 1 sets - 1 reps - 30 sec hold - Seated  Hamstring Stretch  - 1 x daily - 7 x weekly - 1 sets - 1 reps - 30 sec hold - Seated Hip Internal Rotation with Ball and Resistance  - 1 x daily - 7 x weekly - 1 sets - 10 reps   ASSESSMENT:  CLINICAL IMPRESSION: Patient is a 33 y.o. female who was seen today for physical therapy  treatment for pelvic and perineal pain. Patient has not had urinary leakage. She was educated on using vaginal dilators and how to progress them. Her pelvic floor strength has increased to 3/10. She has lack of sensation along the perineal branch of the pudendal nerve. She had increased mobility of the perineal body. She is having less pain in the vaginal area.  Patient will benefit from skilled therapy to improve pelvic floor strength and coordination and reduce her pain.   OBJECTIVE IMPAIRMENTS: decreased coordination, decreased endurance, decreased ROM, decreased strength, increased fascial restrictions, increased muscle spasms, and pain.   ACTIVITY LIMITATIONS: continence, toileting, and locomotion level  PARTICIPATION LIMITATIONS: interpersonal relationship, driving, shopping, and community activity  PERSONAL FACTORS: Time since onset of injury/illness/exacerbation are also affecting patient's functional outcome.   REHAB POTENTIAL: Excellent  CLINICAL DECISION MAKING: Evolving/moderate complexity  EVALUATION COMPLEXITY: Moderate   GOALS: Goals reviewed with patient? Yes  SHORT TERM GOALS: Target date: 07/11/23  Patient educated on how to use her cream vaginally.  Baseline: Goal status: Met 07/04/23  2.  Patient educated on how to massage the perineal body and scars around the area to reduce pain.  Baseline:  Goal status: Met 07/04/23  3.  Patient independent with initial  HEP for hip stretches and diaphragmatic breathing to lengthen the pelvic floor.  Baseline:  Goal status: Met 09/03/23  4.  Patient educate on ways to manage her pelvic pain with vaginal penetration.  Baseline:  Goal status: met  11/17/23    LONG TERM GOALS: Target date: 12/11/23  Patient independent with advanced HEP for pelvic floor manual work and coordination.  Baseline:  Goal status: INITIAL  2.  Patient is using the vaginal dilators and on the largest one so she is able to have penile penetration vaginally.  Baseline:  Goal status: INITIAL  3.  Patient reports urinary leakage with Coughing, Sneezing, and Exercise >/= 75%. Baseline:  Goal status: Met 11/17/23  4.  Patient is able to walk for 45 minutes without vaginal pain due reduction of trigger points.  Baseline:  Goal status: Met 09/12/23   PLAN:  PT FREQUENCY: 1-2x/week  PT DURATION: 6 months  PLANNED INTERVENTIONS: 97110-Therapeutic exercises, 97530- Therapeutic activity, 97112- Neuromuscular re-education, 97535- Self Care, [id number]- Manual therapy, [phone]- Aquatic Therapy, 97014- Electrical stimulation (unattended), 97035- Ultrasound, Patient/Family education, Dry Needling, Joint mobilization, Spinal mobilization, Cryotherapy, Moist heat, and Biofeedback  PLAN FOR NEXT SESSION: manual work internally and around the introitus to the pelvic floor,  work on diaphragmatic breathing, see how the vaginal dilators are going, possible discharge to [facility], PT 11/17/23 5:07 PM

## 2023-11-17 NOTE — Patient Instructions (Signed)
 Vaginal trainers  Prior to Use:   Wash the vaginal trainer with soap and water before and after each use.   Use a water-soluble lubricant like Slippery Stuff or Surgulibe.   Avoid using Vaseline, coconut oil, or other oil-based lubricants. They are not water-soluble and can be irritating to the tissues in the vagina.   Do not use silicon-based lubricants with a silicon vaginal trainer. Using a siliconbased lubricant with a silicon device can contribute to break down of the material.  Setting Up Your Space   Work in a comfortable room lying on your back on a bed or couch with your knees bent and knees relaxed open. Use pillows underneath your knees as they are relaxed open and to support your upper back and head. Place a towel underneath your bottom to collect any lubricant.   Place your vaginal trainers and lubricant on a towel next to you within arm's reach for easy access.   Starting to use your trainer:  o Take 10-20 deep breaths to quiet your nervous system  o Perform stretches to help relax your hips and pelvic floor such as child's pose, cat/cow, or happy baby pose  Using Your Trainers   Coat the smallest vaginal trainer, or the size you are most comfortable using, with lubricant   Place the tip of the trainer at the opening to the vagina.   Take a few deep breaths to adjust to the sensation of the lubricant and trainer.   Slowly insert the rounded end of the trainer into your vaginal opening as far as you are comfortable. Pause and breathe if you experience pain, tension, or muscle guarding at any time. Once you feel comfortable gently slide trainer deeper into the vaginal canal as far as it will go without causing pain or discomfort.  Progressing with your Trainers   Gently press the trainer toward the bottom and sides of the vaginal opening to give it a gentle stretch. Pause and breathe at each spot and tension melts away.   Once fully inserted, turn the trainer clockwise and  counterclockwise to produce different sensations   Slowly move the trainer in and out as you breathe and focus on staying as relaxed as possible  To progress to the next size gradually, once one trainer is completely pain-free and comfortable to use, insert that smaller trainer first for 5-10 minutes and then follow with the next largest size trainer for 5-10 minutes. Gradually decrease the length of time using the smaller trainer as you increase the length of time using the larger trainer.   Move at your pace ad what's comfortable for you.  Wrapping up your session   Use trainers for 5-10 minutes every other day or 3 times a week   Wash and dry your vaginal trainers after use  Other considerations: Try to approach using vaginal trainers from a place of curiosity instead of judgment - what can my body do today, vs. why can't it do this, or I should be able to do this. Try letting go of that idea that it should be different, and try to meet yourself where you are at, without the pressure to change anything Do you bring your vaginal trainers into PT? Sometimes, bringing your trainers into sessions with your PT and having them talk you through the process while you are in control of the trainer can be helpful. Maybe they can help you find ways to make insertion a bit easier for you, or they can  help remind you to breathe. If you aren't doing this, I definitely recommend talking to a PT about it. Sometimes knowing the physical tools you have can help with the mental game. One thing that can be helpful to do before jumping to dilating is called "cupping". It is just taking your hand and holding your palm to your vulva and breathing. Doing this before doing any type of trainer training can be helpful as it lets you take a second and check in, vs jumping right in. Kind of like a warm up to your workout! Try different tools and see if you like another one more. Some of our patients prefer crystal wands or  plastic trainers to silicone, some prefer starting with a vibrating pelvic wand instead of a trainer, look at different options and see what interests you. You can also try different lubricants. And don't feel as though you need to jump right into inserting anything. The first few times (or minutes of the session) may just be about putting it at the entrance without inserting, and that's ok! We have also had patients find success with an external vibrator on their pubic bone while they use trainers as this can help distract nerves and increase muscle relaxation. This can be helpful to normalize the trainers. Leave the one you are currently using and the one you want to progress to somewhere you see it every day, like the bathroom counter or the bedside table. Seeing them every day can make them less intimidating. The more you do something, the more routine it becomes, so setting a vaginal trainers schedule and sticking to it can help make it less intimidating. Last thing that could be helpful to you is to set yourself up a relaxing environment when you use your vaginal trainers. Play your favorite calming music, light your favorite candle, incense, or turn on your diffuser, wear your coziest t-shirt and socks, prop your legs on pillows, anything that feels like a big exhale. Don't distract yourself with tv or a movie. Stay tuned into your body to help maintain relaxation Listening to relaxing music or meditations can also be helpful. Preferences for guided meditations can be so different from person to person so find one you feel relaxed/safe with!   Pelvic Floor Vaginal dilators                                                             Amielle Restore Vaginal Dilator Kit                                                        Vulva Tech                                                                              Restore  Soul Source                                                                                    Intimate Rose                                                                                        Inspire Silicone Dilator Set                                                                                 V Well  Buyer, retail that you pump                                                                               Conservation officer, historic buildings                                                                  Vaginismus Vaginal dilators  Oh Nut for deep vaginal penetration limitation  Most of these dilators you can get on [employer]. The ones you are not able to do then look at the company website or [email or web address].[email or web address]    [facility] [address] [place], [place] [address] Phone # [phone] Fax [phone]

## 2023-11-26 ENCOUNTER — Ambulatory Visit: Admitting: Physical Therapy

## 2023-11-26 ENCOUNTER — Encounter: Payer: Self-pay | Admitting: Physical Therapy

## 2023-11-26 DIAGNOSIS — R278 Other lack of coordination: Secondary | ICD-10-CM

## 2023-11-26 DIAGNOSIS — M6281 Muscle weakness (generalized): Secondary | ICD-10-CM | POA: Diagnosis not present

## 2023-11-26 DIAGNOSIS — R252 Cramp and spasm: Secondary | ICD-10-CM

## 2023-11-26 NOTE — Therapy (Signed)
 OUTPATIENT PHYSICAL THERAPY FEMALE PELVIC TREATMENT   Patient Name: Annette Parks MRN: [id number] DOB:[birth year], 33 y.o., female Today's Date: 11/26/2023  END OF SESSION:  PT End of Session - 11/26/23 1533     Visit Number 7    Date for PT Re-Evaluation 12/11/23    Authorization Type cone    PT Start Time 1530    PT Stop Time 1600    PT Time Calculation (min) 30 min    Activity Tolerance Patient tolerated treatment well    Behavior During Therapy WFL for tasks assessed/performed          Past Medical History:  Diagnosis Date   Anal fissure    Hashimoto's disease    IBS (irritable bowel syndrome)    Migraine headache    Pelvic floor dysfunction?    Perforated eardrum    PONV (postoperative nausea and vomiting)    Pregnancy induced hypertension    UTI (urinary tract infection)    Vaginal Pap smear, abnormal    Past Surgical History:  Procedure Laterality Date   DILATION AND EVACUATION  09/06/2021   EAR TUBE REMOVAL     TONSILLECTOMY AND ADENOIDECTOMY     TYMPANOPLASTY Right    TYMPANOSTOMY TUBE PLACEMENT     x 3   WISDOM TOOTH EXTRACTION     Patient Active Problem List   Diagnosis Date Noted   Pregnancy 10/25/2022   Palpitations 01/18/2020   Closed fracture of fifth metacarpal bone of right hand 11/05/2019   Pain of right shoulder joint on movement 10/26/2019   Acute pain of right wrist 10/26/2019   Hashimoto's thyroiditis 09/04/2018   Hair loss 09/04/2018   Conductive hearing loss of right ear with unrestricted hearing of left ear 09/26/2017   Perforation of right tympanic membrane 09/26/2017   Hip pain, chronic, left 08/13/2017   Lower back pain 08/11/2017   Trochanteric bursitis of left hip 07/31/2017   Sacroiliac (ligament) sprain 07/31/2017   Common migraine 05/07/2016   Eye pain, bilateral 05/07/2016   Facial pain 05/07/2016   GAD (generalized anxiety disorder) 03/22/2015   Neck pain 07/11/2014   Myalgia 07/11/2014   Dysesthesia 07/11/2014     PCP: Watt Harlene BROCKS, MD  REFERRING PROVIDER: Mat Browning, MD   REFERRING DIAG: R10.2 (ICD-10-CM) - Pelvic and perineal pain  THERAPY DIAG:  Muscle weakness (generalized)  Other lack of coordination  Cramp and spasm  Rationale for Evaluation and Treatment: Rehabilitation  ONSET DATE: 09/2022  SUBJECTIVE:  SUBJECTIVE STATEMENT:  I have not used the vaginal wand 3 times per week. Only feels tightness in the pelvic floor. Concerned about the lack of feeling in the vaginal canal. Patient is not feeling the swelling in the vaginal area. I can jog without leakage.   PAIN:  Are you having pain? Yes NPRS scale: 8/10 Pain location: Vaginal  Pain type: burning and tight  Pain description: intermittent   Aggravating factors: penile penetration and would last for days, walking and feels some swelling Relieving factors: no penile penetration  PRECAUTIONS: None  RED FLAGS: None   WEIGHT BEARING RESTRICTIONS: No  FALLS:  Has patient fallen in last 6 months? No  OCCUPATION: nurse  ACTIVITY LEVEL : none  PLOF: Independent  PATIENT GOALS: reduce pain for penile penetration, reduce incontinence  PERTINENT HISTORY:  Anal fissure; IBS Sexual abuse: No  BOWEL MOVEMENT: no issues Pain with bowel movement: No  URINATION: Pain with urination: No Fully empty bladder: Yes:   Stream: strong and will angle to the left or right 11/26/23: urine stream is straight Urgency: No Frequency: average Leakage: Coughing, Sneezing, and Exercise 11/26/23: no leakage Pads: Yes: wears more for sweating in the vaginal area  INTERCOURSE:  Ability to have vaginal penetration Yes.  Prior to birthing her child but has not had intercourse since then Pain with intercourse: Initial Penetration, During  Penetration, Deep Penetration, and After Intercourse DrynessNo Climax: yes Marinoff Scale: 3/3  PREGNANCY: Vaginal deliveries 1 Tearing Yes: tore in 3 spots internally and by the urethra Currently pregnant No    OBJECTIVE:  Note: Objective measures were completed at Evaluation unless otherwise noted.  DIAGNOSTIC FINDINGS:  none    COGNITION: Overall cognitive status: Within functional limits for tasks assessed     SENSATION: Light touch: Appears intact   POSTURE: No Significant postural limitations   LUMBARAROM/PROM:  A/PROM A/PROM  eval 11/26/23  Flexion full full  Extension Decreased by 50% full  Right lateral flexion Decreased by 25% full  Left lateral flexion full full  Right rotation Decreased by 25% full  Left rotation full full   (Blank rows = not tested)  LOWER EXTREMITY ROM: full bilateral hip ROM   LOWER EXTREMITY MMT:  MMT Right eval Left eval Right 11/26/23 Left 11/26/23  Hip extension 4/5 4/5 5/5 5/5  Hip abduction 3/5 3/5 5/5 4/5   (Blank rows = not tested) PALPATION:   General: tenderness located in the lumbar, decreased movement of the lumbar vertebrae  Pelvic Alignment: Decreased movement of the right SI joint; Right ilium rotated anteriorly; sacrum rotated to the left 11/26/23: ASIS is equal  Abdominal: Good abdominal contraction                External Perineal Exam: tender in the bulbocavernosus, ischiocavernosus, perineal body, decreased mobility of the perineal body, vulvar area is pale, decreased sensation along the 11:00  to 1:00; two scars that are tender along the posterior right latera area that is very tender                             Internal Pelvic Floor: tenderness throughout the pelvic floor, decreased mobility of the posterior canal and decreased sensation.  Patient confirms identification and approves PT to assess internal pelvic floor and treatment Yes  PELVIC MMT:   MMT eval 09/12/23 11/17/23  Vaginal 3/5 but very  small lift 2/5 with hug of therapist finger 3/5  Diastasis Recti 1  finger width below umbilicus    (Blank rows = not tested)        TONE: increased   TODAY'S TREATMENT:  11/26/23  Neuromuscular re-education: Down training: Diaphragmatic breathing is sitting with therapist giving tactile cues to the rib cage to open up then fill up her abdomen to feel the pelvic floor drop.  Exercises: Stretches/mobility: Piriformis stretch holding 30 sec bil.  Happy baby holding 30 sec Hamstring stretch in sitting holding 30 sec Self-care: Reviewed with patient on how to use the dilators. How it is not compressing the pudendal nerve.  Educated patient on the pudendal nerve and how it innervates the pelvic floor muscles and sensation of the vaginal area    11/17/23 Manual: Soft tissue mobilization: Manual work to the perineal body to lengthen the tissue and improve the mobility Internal pelvic floor techniques: No emotional/communication barriers or cognitive limitation. Patient is motivated to learn. Patient understands and agrees with treatment goals and plan. PT explains patient will be examined in standing, sitting, and lying down to see how their muscles and joints work. When they are ready, they will be asked to remove their underwear so PT can examine their perineum. The patient is also given the option of providing their own chaperone as one is not provided in our facility. The patient also has the right and is explained the right to defer or refuse any part of the evaluation or treatment including the internal exam. With the patient's consent, PT will use one gloved finger to gently assess the muscles of the pelvic floor, seeing how well it contracts and relaxes and if there is muscle symmetry. After, the patient will get dressed and PT and patient will discuss exam findings and plan of care. PT and patient discuss plan of care, schedule, attendance policy and HEP activities.  Going through the  vaginal canal working on the levator ani, along the perineal body, along the posterior fourchette, along the superior transverse, along the Alcock's canal while laying on right side Neuromuscular re-education: Pelvic floor contraction training: Therapist gloved finger in the vaginal canal working on pelvic floor contraction with a lift with tactile cues on the coccyx.  Self-care: Educated patient on how to use vaginal dilators, how to progress them, ways to move the dilators around and stretch the vaginal opening and how to clean them.     09/12/23 Manual: Internal pelvic floor techniques: No emotional/communication barriers or cognitive limitation. Patient is motivated to learn. Patient understands and agrees with treatment goals and plan. PT explains patient will be examined in standing, sitting, and lying down to see how their muscles and joints work. When they are ready, they will be asked to remove their underwear so PT can examine their perineum. The patient is also given the option of providing their own chaperone as one is not provided in our facility. The patient also has the right and is explained the right to defer or refuse any part of the evaluation or treatment including the internal exam. With the patient's consent, PT will use one gloved finger to gently assess the muscles of the pelvic floor, seeing how well it contracts and relaxes and if there is muscle symmetry. After, the patient will get dressed and PT and patient will discuss exam findings and plan of care. PT and patient discuss plan of care, schedule, attendance policy and HEP activities.  Therapist gloved finger in the vaginal canal working with other finger to perform manual work to the urogenital diaphragm,  pubovaginalis, bulbocavernosus, perineal body and along the introitus. Therapist was able to place her whole gloved index finger into the vaginal canal for first time Neuromuscular re-education: Down training: Supine with  feet on wall and place in a squat position and therapist uses the addaday machine to further lengthen the pelvic floor Squat on 8 inch step with 4 inch on top but no stretch Long leg lunge on both sides with rotating foot to further stretch the inner thigh and pelvic floor.  Exercises: Stretches/mobility: Hamstring stretch supine holding 30 sec bilaterally Pull leg across the body and hold 30 sec bilaterally    09/03/23 Manual: Soft tissue mobilization: Manual work along the perineal body to lengthen Internal pelvic floor techniques: No emotional/communication barriers or cognitive limitation. Patient is motivated to learn. Patient understands and agrees with treatment goals and plan. PT explains patient will be examined in standing, sitting, and lying down to see how their muscles and joints work. When they are ready, they will be asked to remove their underwear so PT can examine their perineum. The patient is also given the option of providing their own chaperone as one is not provided in our facility. The patient also has the right and is explained the right to defer or refuse any part of the evaluation or treatment including the internal exam. With the patient's consent, PT will use one gloved finger to gently assess the muscles of the pelvic floor, seeing how well it contracts and relaxes and if there is muscle symmetry. After, the patient will get dressed and PT and patient will discuss exam findings and plan of care. PT and patient discuss plan of care, schedule, attendance policy and HEP activities.  Therapist gloved finger in the vaginal canal working on the introitus especially the right where there was a band of tissue is taut. Manual work with tissue between two finger, sweeping motion of the introitus,  Therapist gloved finger in the introitus 3 inches for the first time and did manual work while patient was working on diaphragmatic breathing to feel the pelvic floor drop.  Dry  needling: Trigger Point Dry Needling  Initial Treatment: Pt instructed on Dry Needling rational, procedures, and possible side effects. Pt instructed to expect mild to moderate muscle soreness later in the day and/or into the next day.  Pt instructed in methods to reduce muscle soreness. Pt instructed to continue prescribed HEP. Because Dry Needling was performed over or adjacent to a lung field, pt was educated on S/S of pneumothorax and to seek immediate medical attention should they occur.  Patient was educated on signs and symptoms of infection and other risk factors and advised to seek medical attention should they occur.  Patient verbalized understanding of these instructions and education.   Patient Verbal Consent Given: Yes Education Handout Provided: Yes Muscles Treated: perineal body 1 time then stopped due to being painful Electrical Stimulation Performed: No Treatment Response/Outcome: elongation of muscle and trigger point response Self-care: Educated patient on how to use the wand to massage the external pelvic floor and her massaging herself Educated patient on how to use the smallest size dilator to work on expansion of the vaginal canal    PATIENT EDUCATION:  11/26/23 Education details: education on vaginal dilators and wand to improve elasticity of the tissue; Access Code: [email or web address] Person educated: Patient Education method: Explanation, Demonstration, Tactile cues, Verbal cues, and Handouts Education comprehension: verbalized understanding, returned demonstration, verbal cues required, tactile cues required, and needs further education  HOME  EXERCISE PROGRAM: 11/26/23 Access Code: [email or web address] URL: [email or web address] Date: 11/26/2023 Prepared by: Channing Pereyra  Program Notes continue with vaginal wand 3 times per weekstart using the dilators on your timeline. come see me in the future if needed.   Exercises - Seated Piriformis Stretch with Trunk Bend   - 1 x daily - 7 x weekly - 1 sets - 1 reps - 30 sec hold - Seated Hamstring Stretch  - 1 x daily - 7 x weekly - 1 sets - 1 reps - 30 sec hold - Seated Happy Baby With Trunk Flexion For Pelvic Relaxation  - 1 x daily - 7 x weekly - 1 sets - 1 reps - 30 sec hold - Seated Diaphragmatic Breathing  - 1 x daily - 7 x weekly - 1 sets - 10 reps    ASSESSMENT:  CLINICAL IMPRESSION: Patient is a 33 y.o. female who was seen today for physical therapy  treatment for pelvic and perineal pain. Patient has not had urinary leakage. She was educated on using vaginal dilators and how to progress them. Her pelvic floor strength has increased to 3/10. She has lack of sensation along the perineal branch of the pudendal nerve. She had increased mobility of the perineal body. She is having less pain in the vaginal area.  She has not had vaginal penetration du eto her and her husband being busy with life. She will try to incorporate using the vaginal dilators in the future.    OBJECTIVE IMPAIRMENTS: decreased coordination, decreased endurance, decreased ROM, decreased strength, increased fascial restrictions, increased muscle spasms, and pain.   ACTIVITY LIMITATIONS: continence, toileting, and locomotion level  PARTICIPATION LIMITATIONS: interpersonal relationship, driving, shopping, and community activity  PERSONAL FACTORS: Time since onset of injury/illness/exacerbation are also affecting patient's functional outcome.   REHAB POTENTIAL: Excellent  CLINICAL DECISION MAKING: Evolving/moderate complexity  EVALUATION COMPLEXITY: Moderate   GOALS: Goals reviewed with patient? Yes  SHORT TERM GOALS: Target date: 07/11/23  Patient educated on how to use her cream vaginally.  Baseline: Goal status: Met 07/04/23  2.  Patient educated on how to massage the perineal body and scars around the area to reduce pain.  Baseline:  Goal status: Met 07/04/23  3.  Patient independent with initial  HEP for hip stretches and  diaphragmatic breathing to lengthen the pelvic floor.  Baseline:  Goal status: Met 09/03/23  4.  Patient educate on ways to manage her pelvic pain with vaginal penetration.  Baseline:  Goal status: met 11/17/23    LONG TERM GOALS: Target date: 12/11/23  Patient independent with advanced HEP for pelvic floor manual work and coordination.  Baseline:  Goal status: Met 11/26/23  2.  Patient is using the vaginal dilators and on the largest one so she is able to have penile penetration vaginally.  Baseline:  Goal status: Not met 11/26/23  3.  Patient reports urinary leakage with Coughing, Sneezing, and Exercise >/= 75%. Baseline:  Goal status: Met 11/17/23  4.  Patient is able to walk for 45 minutes without vaginal pain due reduction of trigger points.  Baseline:  Goal status: Met 09/12/23   PLAN:Discharge to HEP this visit    Channing Pereyra, PT 11/26/23 4:14 PM  PHYSICAL THERAPY DISCHARGE SUMMARY  Visits from Start of Care: 7  Current functional level related to goals / functional outcomes: Patient is having no pain in the vaginal area just tightness when using the vaginal wand. She has not used the vaginal dilators yet due to her  schedule. She is able to jog without leaking urine. She has not leaked urine in several weeks. She does have lack of sensation in the vaginal area that seems to be along the pudendal nerve branch.    Remaining deficits: See above.    Education / Equipment: HEP   Patient agrees to discharge. Patient goals were partially met. Patient is being discharged due to being pleased with the current functional level. Thank you for the referral.   Channing Pereyra, PT 11/26/23 4:14 PM

## 2023-12-05 ENCOUNTER — Encounter: Admitting: Physical Therapy

## 2024-01-10 NOTE — Progress Notes (Signed)
 [facility] [address] [place], [place] [address] [phone]  Date:  01/15/2024   Name:  Annette Parks   DOB:  1991/04/21   MRN:  [id number]  PCP:  Watt Harlene BROCKS, MD    Chief Complaint: No chief complaint on file.   History of Present Illness:  Annette Parks is a 33 y.o. very pleasant female patient who presents with the following:  Pt seen today for a CPE Last seen by me about one year ago.  At that time her son Katrinka was about 64 months old.  We also did a virtual visit in March for anxiety.  In March Skarlette noted she was feeling a lot of anxiety, in part related to her new baby.  I prescribed some fluoxetine  but she ended up not starting it Married to [place]  history of hashimoto's thyroiditis   Labs can be updated  Pap- per gyn  Flu shot- will be done at work   Discussed the use of AI scribe software for clinical note transcription with the patient, who gave verbal consent to proceed.  History of Present Illness Annette Parks is a 33 year old female who presents with concerns about anxiety management and recent hives.  She experiences persistent anxiety, describing herself as 'very anxious all the time' and attributes part of it to her 'type A' personality. She has been managing without medication, despite previously considering Prozac , due to concerns about dependency. She feels overwhelmed by life events, including her husband's past health issues, which have since resolved. She is contemplating starting medication due to her persistent anxiety but is concerned about potential weight gain.  Five days ago, she experienced an episode of hives, which started with itchy elbows and spread all over her body, including her legs. She took Benadryl , which alleviated the symptoms by the next day. She has never experienced hives before and is unsure of the cause, speculating it might be stress-related. She has a history of  Hashimoto's thyroiditis and previously had positive ANA labs.  Her son recently had a nine-day episode of vomiting and diarrhea, which resolved without a clear diagnosis. This added to her stress, but he is now feeling better.  She mentions taking vitamin D  inconsistently and occasionally taking prenatal vitamins. She has not started any new medications recently. No recent exposure to new cleaning products or other potential allergens. Reports no symptoms associated with low heart rate, which she monitors with a watch.    Patient Active Problem List   Diagnosis Date Noted   Pregnancy 10/25/2022   Palpitations 01/18/2020   Closed fracture of fifth metacarpal bone of right hand 11/05/2019   Pain of right shoulder joint on movement 10/26/2019   Acute pain of right wrist 10/26/2019   Hashimoto's thyroiditis 09/04/2018   Hair loss 09/04/2018   Conductive hearing loss of right ear with unrestricted hearing of left ear 09/26/2017   Perforation of right tympanic membrane 09/26/2017   Hip pain, chronic, left 08/13/2017   Lower back pain 08/11/2017   Trochanteric bursitis of left hip 07/31/2017   Sacroiliac (ligament) sprain 07/31/2017   Common migraine 05/07/2016   Eye pain, bilateral 05/07/2016   Facial pain 05/07/2016   GAD (generalized anxiety disorder) 03/22/2015   Neck pain 07/11/2014   Myalgia 07/11/2014   Dysesthesia 07/11/2014    Past Medical History:  Diagnosis Date   Anal fissure    Hashimoto's disease  IBS (irritable bowel syndrome)    Migraine headache    Pelvic floor dysfunction?    Perforated eardrum    PONV (postoperative nausea and vomiting)    Pregnancy induced hypertension    UTI (urinary tract infection)    Vaginal Pap smear, abnormal     Past Surgical History:  Procedure Laterality Date   DILATION AND EVACUATION  09/06/2021   EAR TUBE REMOVAL     TONSILLECTOMY AND ADENOIDECTOMY     TYMPANOPLASTY Right    TYMPANOSTOMY TUBE PLACEMENT     x 3   WISDOM  TOOTH EXTRACTION      Social History   Tobacco Use   Smoking status: Never   Smokeless tobacco: Never  Vaping Use   Vaping status: Never Used  Substance Use Topics   Alcohol use: Not Currently    Comment: occasional-2 per month   Drug use: No    Family History  Problem Relation Age of Onset   Migraines Mother    Hashimoto's thyroiditis Mother    Endometriosis Mother    Hyperlipidemia Father    Diverticulitis Father        had to have part of colon removed   Asthma Brother    Diabetes Maternal Grandmother     No Known Allergies  Medication list has been reviewed and updated.  Current Outpatient Medications on File Prior to Visit  Medication Sig Dispense Refill   cetirizine  (ZYRTEC ) 10 MG tablet Take 1 tablet by mouth daily.     Prenatal Vit-Fe Fumarate-FA (PRENATAL VITAMIN PO)      VITAMIN D  PO      No current facility-administered medications on file prior to visit.    Review of Systems:  As per HPI- otherwise negative.   Physical Examination: Vitals:   01/15/24 1435  BP: 137/87  Pulse: 93   Vitals:   01/15/24 1435  Weight: 131 lb (59.4 kg)  Height: 5' 5 (1.651 m)   Body mass index is 21.8 kg/m. Ideal Body Weight: Weight in (lb) to have BMI = 25: 149.9  GEN: no acute distress. Normal weight, looks well  HEENT: Atraumatic, Normocephalic.  Bilateral TM wnl, oropharynx normal.  PEERL,EOMI.   Ears and Nose: No external deformity. CV: RRR, No M/G/R. No JVD. No thrill. No extra heart sounds. PULM: CTA B, no wheezes, crackles, rhonchi. No retractions. No resp. distress. No accessory muscle use. ABD: S, NT, ND, +BS. No rebound. No HSM. EXTR: No c/c/e PSYCH: Normally interactive. Conversant.    Assessment and Plan: Physical exam  Screening for diabetes mellitus - Plan: Comprehensive metabolic panel with GFR, Hemoglobin A1c  Thyroid  disorder screening  Screening, lipid - Plan: Lipid panel  Hashimoto's thyroiditis - Plan: TSH  Screening for  deficiency anemia - Plan: CBC  Vitamin D  deficiency - Plan: VITAMIN D  25 Hydroxy (Vit-D Deficiency, Fractures)  Assessment & Plan Adult Wellness Visit Routine visit with resolved stress-related hives.  She also sometimes notes her watch will warn of bradycardia, her heart rate may go into the 50s and rarely into the 40s.  She does not have any symptoms of bradycardia.  We have done an EKG and Zio patch last year which were reassuring.  For the time being we will continue to observe, she will let me know if any changes - Order CBC, complete metabolic panel, A1c, cholesterol, thyroid , vitamin D  levels, and lipid panel.  Adjustment disorder with mixed anxiety and depressed mood Persistent anxiety and stress due to personal and family health  issues. Addressed concerns about medication dependency and societal perceptions.  I encouraged her to give an SSRI a try, I do think it would help her - Prescribe fluoxetine  20 mg daily for one week, then increase to 40 mg daily. - Monitor response to fluoxetine  over 3-4 weeks and report progress.  Autoimmune thyroiditis (Hashimoto's disease) Hashimoto's disease with previous positive ANA labs. No new autoimmune symptoms. - Monitor thyroid  function as part of routine blood work.  Signed Harlene Schroeder, MD  Addendum 9/19, received labs as below.  Message to patient  Results for orders placed or performed in visit on 01/15/24  CBC   Collection Time: 01/15/24  3:04 PM  Result Value Ref Range   WBC 6.1 4.0 - 10.5 K/uL   RBC 5.19 (H) 3.87 - 5.11 Mil/uL   Platelets 306.0 150.0 - 400.0 K/uL   Hemoglobin 14.2 12.0 - 15.0 g/dL   HCT 57.8 63.9 - 53.9 %   MCV 81.2 78.0 - 100.0 fl   MCHC 33.7 30.0 - 36.0 g/dL   RDW 87.0 88.4 - 84.4 %  Comprehensive metabolic panel with GFR   Collection Time: 01/15/24  3:04 PM  Result Value Ref Range   Sodium 139 135 - 145 mEq/L   Potassium 3.9 3.5 - 5.1 mEq/L   Chloride 102 96 - 112 mEq/L   CO2 28 19 - 32 mEq/L    Glucose, Bld 79 70 - 99 mg/dL   BUN 12 6 - 23 mg/dL   Creatinine, Ser 9.29 0.40 - 1.20 mg/dL   Total Bilirubin 1.2 0.2 - 1.2 mg/dL   Alkaline Phosphatase 72 39 - 117 U/L   AST 12 0 - 37 U/L   ALT 12 0 - 35 U/L   Total Protein 7.4 6.0 - 8.3 g/dL   Albumin 4.9 3.5 - 5.2 g/dL   GFR 886.26 >39.99 mL/min   Calcium 9.9 8.4 - 10.5 mg/dL  Hemoglobin J8r   Collection Time: 01/15/24  3:04 PM  Result Value Ref Range   Hgb A1c MFr Bld 5.8 4.6 - 6.5 %  Lipid panel   Collection Time: 01/15/24  3:04 PM  Result Value Ref Range   Cholesterol 146 0 - 200 mg/dL   Triglycerides 14.9 0.0 - 149.0 mg/dL   HDL 48.39 >60.99 mg/dL   VLDL 82.9 0.0 - 59.9 mg/dL   LDL Cholesterol 78 0 - 99 mg/dL   Total CHOL/HDL Ratio 3    NonHDL 94.50   TSH   Collection Time: 01/15/24  3:04 PM  Result Value Ref Range   TSH 1.58 0.35 - 5.50 uIU/mL  VITAMIN D  25 Hydroxy (Vit-D Deficiency, Fractures)   Collection Time: 01/15/24  3:04 PM  Result Value Ref Range   VITD 27.37 (L) 30.00 - 100.00 ng/mL

## 2024-01-10 NOTE — Patient Instructions (Addendum)
 Good to see you again today- I will be in touch with your labs Suggest taking fluoxetine  20 mg daily for a week or so, then go to 40 mg Let me know how this is working or you over the next month or so- I hope it is helpful!

## 2024-01-15 ENCOUNTER — Ambulatory Visit (INDEPENDENT_AMBULATORY_CARE_PROVIDER_SITE_OTHER): Payer: 59 | Admitting: Family Medicine

## 2024-01-15 ENCOUNTER — Encounter: Payer: Self-pay | Admitting: Family Medicine

## 2024-01-15 VITALS — BP 137/87 | HR 93 | Ht 65.0 in | Wt 131.0 lb

## 2024-01-15 DIAGNOSIS — Z130 Encounter for screening for diseases of the blood and blood-forming organs and certain disorders involving the immune mechanism: Secondary | ICD-10-CM | POA: Diagnosis not present

## 2024-01-15 DIAGNOSIS — Z0000 Encounter for general adult medical examination without abnormal findings: Secondary | ICD-10-CM | POA: Diagnosis not present

## 2024-01-15 DIAGNOSIS — E063 Autoimmune thyroiditis: Secondary | ICD-10-CM

## 2024-01-15 DIAGNOSIS — Z1329 Encounter for screening for other suspected endocrine disorder: Secondary | ICD-10-CM | POA: Diagnosis not present

## 2024-01-15 DIAGNOSIS — E559 Vitamin D deficiency, unspecified: Secondary | ICD-10-CM

## 2024-01-15 DIAGNOSIS — Z131 Encounter for screening for diabetes mellitus: Secondary | ICD-10-CM | POA: Diagnosis not present

## 2024-01-15 DIAGNOSIS — Z13220 Encounter for screening for lipoid disorders: Secondary | ICD-10-CM | POA: Diagnosis not present

## 2024-01-16 ENCOUNTER — Encounter: Payer: Self-pay | Admitting: Family Medicine

## 2024-01-16 LAB — COMPREHENSIVE METABOLIC PANEL WITH GFR
ALT: 12 U/L (ref 0–35)
AST: 12 U/L (ref 0–37)
Albumin: 4.9 g/dL (ref 3.5–5.2)
Alkaline Phosphatase: 72 U/L (ref 39–117)
BUN: 12 mg/dL (ref 6–23)
CO2: 28 meq/L (ref 19–32)
Calcium: 9.9 mg/dL (ref 8.4–10.5)
Chloride: 102 meq/L (ref 96–112)
Creatinine, Ser: 0.7 mg/dL (ref 0.40–1.20)
GFR: 113.73 mL/min (ref >60.00)
Glucose, Bld: 79 mg/dL (ref 70–99)
Potassium: 3.9 meq/L (ref 3.5–5.1)
Sodium: 139 meq/L (ref 135–145)
Total Bilirubin: 1.2 mg/dL (ref 0.2–1.2)
Total Protein: 7.4 g/dL (ref 6.0–8.3)

## 2024-01-16 LAB — CBC
HCT: 42.1 % (ref 36.0–46.0)
Hemoglobin: 14.2 g/dL (ref 12.0–15.0)
MCHC: 33.7 g/dL (ref 30.0–36.0)
MCV: 81.2 fl (ref 78.0–100.0)
Platelets: 306 K/uL (ref 150.0–400.0)
RBC: 5.19 Mil/uL — ABNORMAL HIGH (ref 3.87–5.11)
RDW: 12.9 % (ref 11.5–15.5)
WBC: 6.1 K/uL (ref 4.0–10.5)

## 2024-01-16 LAB — LIPID PANEL
Cholesterol: 146 mg/dL (ref 0–200)
HDL: 51.6 mg/dL (ref >39.00)
LDL Cholesterol: 78 mg/dL (ref 0–99)
NonHDL: 94.5
Total CHOL/HDL Ratio: 3
Triglycerides: 85 mg/dL (ref 0.0–149.0)
VLDL: 17 mg/dL (ref 0.0–40.0)

## 2024-01-16 LAB — TSH: TSH: 1.58 u[IU]/mL (ref 0.35–5.50)

## 2024-01-16 LAB — HEMOGLOBIN A1C: Hgb A1c MFr Bld: 5.8 % (ref 4.6–6.5)

## 2024-01-16 LAB — VITAMIN D 25 HYDROXY (VIT D DEFICIENCY, FRACTURES): VITD: 27.37 ng/mL — ABNORMAL LOW (ref 30.00–100.00)

## 2024-01-16 NOTE — Addendum Note (Signed)
 Addended by: WATT RAISIN C on: 01/16/2024 01:01 PM   Modules accepted: Orders

## 2024-02-20 ENCOUNTER — Encounter: Payer: Self-pay | Admitting: Internal Medicine

## 2024-02-20 ENCOUNTER — Ambulatory Visit: Admitting: Internal Medicine

## 2024-02-20 VITALS — BP 122/80 | HR 93 | Ht 65.0 in | Wt 132.6 lb

## 2024-02-20 DIAGNOSIS — E063 Autoimmune thyroiditis: Secondary | ICD-10-CM | POA: Diagnosis not present

## 2024-02-20 DIAGNOSIS — E0500 Thyrotoxicosis with diffuse goiter without thyrotoxic crisis or storm: Secondary | ICD-10-CM | POA: Diagnosis not present

## 2024-02-20 NOTE — Progress Notes (Signed)
 Patient ID: Annette Parks, female   DOB: 1991-03-24, 33 y.o.   MRN: [id number]   HPI  Annette Parks is a 33 y.o.-year-old very pleasant female, initially referred by her PCP, Dr. Ubaldo, returning for follow-up for Hashimoto's thyroiditis.  Last visit 6 months ago. Her mother, Annette Parks, is also my patient.  Interim hx: At last visit, she had significant hair loss, light sensitivity (started in 12/2022), anxious, and with significant fatigue despite sleeping well at night.  This symptoms have resolved now. Also, no tremors or palpitations.  She feels that her weight stabilized at her baseline. No plans for pregnancy.  Reviewed history: She was diagnosed with Hashimoto's thyroiditis in 07/2018.  She has normal thyroid  function tests then so she did not require levothyroxine.  However, in 02/2023 she had thyrotoxic thyroid  tests and also retrospectively hair loss, palpitations, anxiety, excessive weight loss, fatigue, mood swings, and increase sensitivity to light.  No tremors.  Reviewed her TFTs: Lab Results  Component Value Date   TSH 1.58 01/15/2024   TSH 1.77 08/22/2023   TSH 1.11 06/13/2023   TSH 0.00 (L) 03/21/2023   TSH 1.96 12/11/2022   TSH 2.67 01/09/2022   TSH 1.96 09/18/2021   TSH 1.60 09/18/2020   TSH 1.67 12/27/2019   TSH 1.35 09/13/2019   FREET4 1.4 08/22/2023   FREET4 1.2 06/13/2023   FREET4 1.86 (H) 03/21/2023   FREET4 0.96 09/18/2021   FREET4 0.90 09/18/2020   FREET4 0.80 09/13/2019   FREET4 1.02 03/08/2019  03/14/2023:   Her thyroid  antibodies were elevated: Lab Results  Component Value Date   TSI 316 (H) 03/21/2023     Component     Latest Ref Rng & Units 03/22/2015  Thyroperoxidase Ab SerPl-aCnc     <9 IU/mL 2  We started selenium 200 mcg daily in 08/2018, but her TPO antibodies were still elevated at last check so we stopped it.  Thyroid  uptake and scan (03/21/2023): Uniform uptake within enlarged thyroid  gland. Pyramidal lobe evident.  4 hour  I-123 uptake = 8.8% (normal 5-20%) 24 hour I-123 uptake = 17.2% (normal 10-30%)   IMPRESSION: 1. Uniform uptake in enlarged gland. Foraminal pyramidal lobe. No nodularity. 2. Iodine uptake within normal limits.  She had an ectopic pregnancy 11/2020 for which she had to get methotrexate .  Before last visit, she had a miscarriage at 82 weeks,for which she had to have a D and C.  Afterwards, she had a healthy pregnancy and gave birth to a healthy baby boy (Annette Parks) 10/25/2022.  Pt denies: - feeling nodules in neck - hoarseness - dysphagia - choking  She has + FH of thyroid  disorders in: mother, MGM. No FH of thyroid  cancer. No h/o radiation tx to head or neck. No herbal supplements. No Biotin use now. No recent steroids use. She takes a MVI, vitamin D .  Pt. also has a history of Raynauds phenomenon. She was on OCPs - since a teenager. She tried to stop x 8 mo restarted b/c acne and also dysmenorrhea.  She also has a history of heart valve insufficiency. She has neck, lower back pain, hip pain; she was diagnosed with spina bifida occulta. She has constipation (chronic) and anal fissures. She has hyperlipidemia. She was on Amitriptyline  for bladder dysfxn.  Previously had investigation for palpitations and was found to have PVCs, by cardiology.    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  ROS: + See HPI  I reviewed pt's medications, allergies, PMH, social hx, family hx, and  changes were documented in the history of present illness. Otherwise, unchanged from my initial visit note.  Past Medical History:  Diagnosis Date   Anal fissure    Hashimoto's disease    IBS (irritable bowel syndrome)    Migraine headache    Pelvic floor dysfunction?    Perforated eardrum    PONV (postoperative nausea and vomiting)    Pregnancy induced hypertension    UTI (urinary tract infection)    Vaginal Pap smear, abnormal    Past Surgical History:  Procedure Laterality Date   DILATION AND EVACUATION   09/06/2021   EAR TUBE REMOVAL     TONSILLECTOMY AND ADENOIDECTOMY     TYMPANOPLASTY Right    TYMPANOSTOMY TUBE PLACEMENT     x 3   WISDOM TOOTH EXTRACTION     Social History   Socioeconomic History   Marital status: Married    Spouse name: Not on file   Number of children: 0   Years of education: Not on file   Highest education level: Not on file  Occupational History   Occupation: Registered nurse  Tobacco Use   Smoking status: Never   Smokeless tobacco: Never  Vaping Use   Vaping status: Never Used  Substance and Sexual Activity   Alcohol use: Not Currently    Comment: occasional-2 per month   Drug use: No   Sexual activity: Not Currently  Other Topics Concern   Not on file  Social History Narrative   Married with 1 child     moved here from [place]  went to [employer]. Moved here after her parents moved here.   Clinic nurse in [employer]   Social Drivers of Health   Financial Resource Strain: Not on file  Food Insecurity: No Food Insecurity (10/25/2022)   Hunger Vital Sign    Worried About Running Out of Food in the Last Year: Never true    Ran Out of Food in the Last Year: Never true  Transportation Needs: No Transportation Needs (10/25/2022)   PRAPARE - Administrator, Civil Service (Medical): No    Lack of Transportation (Non-Medical): No  Physical Activity: Not on file  Stress: Not on file  Social Connections: Not on file  Intimate Partner Violence: Not At Risk (10/25/2022)   Humiliation, Afraid, Rape, and Kick questionnaire    Fear of Current or Ex-Partner: No    Emotionally Abused: No    Physically Abused: No    Sexually Abused: No   Current Outpatient Medications on File Prior to Visit  Medication Sig Dispense Refill   cetirizine  (ZYRTEC ) 10 MG tablet Take 1 tablet by mouth daily.     Prenatal Vit-Fe Fumarate-FA (PRENATAL VITAMIN PO)      VITAMIN D  PO      No current facility-administered  medications on file prior to visit.   No Known Allergies Family History  Problem Relation Age of Onset   Migraines Mother    Hashimoto's thyroiditis Mother    Endometriosis Mother    Hyperlipidemia Father    Diverticulitis Father        had to have part of colon removed   Asthma Brother    Diabetes Maternal Grandmother    PE: BP 122/80   Pulse 93   Ht 5' 5 (1.651 m)   Wt 132 lb 9.6 oz (60.1 kg)   SpO2 99%   BMI 22.07 kg/m   Wt Readings from Last 15 Encounters:  02/20/24 132 lb  9.6 oz (60.1 kg)  01/15/24 131 lb (59.4 kg)  08/22/23 133 lb (60.3 kg)  03/21/23 136 lb 6.4 oz (61.9 kg)  01/13/23 145 lb 6.4 oz (66 kg)  10/25/22 183 lb 12.8 oz (83.4 kg)  09/24/22 178 lb (80.7 kg)  08/18/22 170 lb 1.6 oz (77.2 kg)  05/06/22 144 lb 6.4 oz (65.5 kg)  01/09/22 144 lb 3.2 oz (65.4 kg)  10/01/21 140 lb (63.5 kg)  09/18/21 141 lb 12.8 oz (64.3 kg)  01/08/21 132 lb (59.9 kg)  01/05/21 132 lb 8 oz (60.1 kg)  09/18/20 130 lb (59 kg)   Constitutional: normal weight, in NAD Eyes:  EOMI, no exophthalmos ENT: no neck masses, no cervical lymphadenopathy Cardiovascular: Tachycardia, RR, No MRG Respiratory: CTA B Musculoskeletal: no deformities Skin:no rashes Neurological: no tremor with outstretched hands  ASSESSMENT: 1. Hashimoto thyroiditis  2.Graves ds.  PLAN: 1. And 2.  Patient with history of Hashimoto's thyroiditis, for which she did not require levothyroxine yet.  She did well during the last pregnancy, without the need for levothyroxine.  She previously had an ectopic pregnancy in 2023 and then a miscarriage, but then gave birth to a healthy boy.  TFTs were excellent in 11/2022, approximately 1.5 months after giving birth, however, in 02/2023, she had an undetectable TSH along with a high total T4.  She also has thyrotoxic symptoms including hair loss, palpitations, anxiety, restlessness, excessive weight loss (approximately 50 pounds after delivery), fatigue, mood swings, and  sensitivity to light.  We discussed about possible differential diagnosis for her thyrotoxicosis to include viral subacute thyroiditis (from COVID-19 infection in 12/2022), was partum thyroiditis, thyrotoxic episode of Hashimoto's thyroiditis, or Graves' disease.  Her TSI and TPO antibodies were elevated, and a thyroid  uptake was normal while the scan was uniform, with a detectable pyramidal lobe, confirming mild Graves' disease. -We discussed about treatment palpitations and anxiety but she was breast-feeding at that time and wanted to hold off starting these.  Fortunately, her TFTs were normal in 05/2023 and they remained normal afterwards, with the last TSH in 12/2023. - At last visit, she described more hair loss, fatigue, light sensitivity when driving, increased appetite without weight gain and pulse dropping in the 50s on her smart watch, but  her TFTs were all normal, with unclear whether the symptoms were related to stress or resolving thyroid  condition.  However, at today's visit, she feels back to baseline, with all the above symptoms resolving.  She mentions she saw her ophthalmologist fairly recently but there was no concern for thyroid  eye disease. - At today's visit, there is no need to repeat her TFTs since he just had a normal TSH 1 month ago, but plan to repeat them in 5-6 months. - I plan to see her back in a year.  Orders Placed This Encounter  Procedures   TSH   T4, free   T3, free   Lela Fendt, MD PhD [facility]

## 2024-02-20 NOTE — Patient Instructions (Signed)
 Please return in 1 year but in 6 months for labs.

## 2024-03-01 ENCOUNTER — Encounter: Payer: Self-pay | Admitting: Radiology

## 2024-03-05 ENCOUNTER — Encounter: Payer: Self-pay | Admitting: Family Medicine

## 2024-04-23 ENCOUNTER — Telehealth

## 2024-04-23 ENCOUNTER — Telehealth: Admitting: Family Medicine

## 2024-04-23 DIAGNOSIS — L03011 Cellulitis of right finger: Secondary | ICD-10-CM | POA: Diagnosis not present

## 2024-04-23 MED ORDER — CEPHALEXIN 500 MG PO CAPS: 500.0000 mg | ORAL_CAPSULE | Freq: Three times a day (TID) | ORAL | 0 refills | Status: AC

## 2024-04-23 NOTE — Progress Notes (Signed)
 " Virtual Visit Consent   Annette Parks, you are scheduled for a virtual visit with a [facility] provider today. Just as with appointments in the office, your consent must be obtained to participate. Your consent will be active for this visit and any virtual visit you may have with one of our providers in the next 365 days. If you have a MyChart account, a copy of this consent can be sent to you electronically.  As this is a virtual visit, video technology does not allow for your provider to perform a traditional examination. This may limit your provider's ability to fully assess your condition. If your provider identifies any concerns that need to be evaluated in person or the need to arrange testing (such as labs, EKG, etc.), we will make arrangements to do so. Although advances in technology are sophisticated, we cannot ensure that it will always work on either your end or our end. If the connection with a video visit is poor, the visit may have to be switched to a telephone visit. With either a video or telephone visit, we are not always able to ensure that we have a secure connection.  By engaging in this virtual visit, you consent to the provision of healthcare and authorize for your insurance to be billed (if applicable) for the services provided during this visit. Depending on your insurance coverage, you may receive a charge related to this service.  I need to obtain your verbal consent now. Are you willing to proceed with your visit today? Annette Parks has provided verbal consent on 04/23/2024 for a virtual visit (video or telephone). Loa Lamp, FNP  Date: 04/23/2024 4:39 PM   Virtual Visit via Video Note   I, Loa Lamp, connected with  Annette Parks  ([id number], 1990-12-30) on 04/23/2024 at  4:30 PM EST by a video-enabled telemedicine application and verified that I am speaking with the correct person using two identifiers.  Location: Patient: Home Provider: Virtual Visit Location  Provider: Home Office   I discussed the limitations of evaluation and management by telemedicine and the availability of in person appointments. The patient expressed understanding and agreed to proceed.    History of Present Illness: Annette Parks is a 33 y.o. who identifies as a female who was assigned female at birth, and is being seen today for redness and open area on rt 3rd finger warm to touch.   HPI: HPI  Problems:  Patient Active Problem List   Diagnosis Date Noted   Pregnancy 10/25/2022   Palpitations 01/18/2020   Closed fracture of fifth metacarpal bone of right hand 11/05/2019   Pain of right shoulder joint on movement 10/26/2019   Acute pain of right wrist 10/26/2019   Hashimoto's thyroiditis 09/04/2018   Hair loss 09/04/2018   Conductive hearing loss of right ear with unrestricted hearing of left ear 09/26/2017   Perforation of right tympanic membrane 09/26/2017   Hip pain, chronic, left 08/13/2017   Lower back pain 08/11/2017   Trochanteric bursitis of left hip 07/31/2017   Sacroiliac (ligament) sprain 07/31/2017   Common migraine 05/07/2016   Eye pain, bilateral 05/07/2016   Facial pain 05/07/2016   GAD (generalized anxiety disorder) 03/22/2015   Neck pain 07/11/2014   Myalgia 07/11/2014   Dysesthesia 07/11/2014    Allergies: Allergies[1] Medications: Current Medications[2]  Observations/Objective: Patient is well-developed, well-nourished in no acute distress.  Resting comfortably at home.  Head is normocephalic, atraumatic.  No labored breathing.  Speech is  clear and coherent with logical content.  Patient is alert and oriented at baseline.    Assessment and Plan: 1. Paronychia of finger, right (Primary)  Warm epsom salt soaks, peroxide, UC if worsens.   Follow Up Instructions: I discussed the assessment and treatment plan with the patient. The patient was provided an opportunity to ask questions and all were answered. The patient agreed with the  plan and demonstrated an understanding of the instructions.  A copy of instructions were sent to the patient via MyChart unless otherwise noted below.     The patient was advised to call back or seek an in-person evaluation if the symptoms worsen or if the condition fails to improve as anticipated.    Simrin Vegh, FNP     [1] No Known Allergies [2]  Current Outpatient Medications:    cephALEXin  (KEFLEX ) 500 MG capsule, Take 1 capsule (500 mg total) by mouth 3 (three) times daily for 7 days., Disp: 21 capsule, Rfl: 0   cetirizine  (ZYRTEC ) 10 MG tablet, Take 1 tablet by mouth daily., Disp: , Rfl:    Prenatal Vit-Fe Fumarate-FA (PRENATAL VITAMIN PO), , Disp: , Rfl:    VITAMIN D  PO, , Disp: , Rfl:   "

## 2024-04-23 NOTE — Patient Instructions (Signed)
 Paronychia Paronychia is an infection of the skin that surrounds a nail. It usually affects the skin around a fingernail, but it may also occur near a toenail. It often causes pain and swelling around the nail. In some cases, a collection of pus (abscess) can form near or under the nail.  This condition may develop suddenly, or it may develop gradually over a longer period. In most cases, paronychia is not serious, and it will clear up with treatment. What are the causes? This condition may be caused by bacteria or a fungus, such as yeast. The bacteria or fungus can enter the body through an opening in the skin, such as a cut or a hangnail, and cause an infection in your fingernail or toenail. Other causes may include: Recurrent injury to the fingernail or toenail area. Irritation of the base and sides of the nail (cuticle). Injury and irritation can result in inflammation, swelling, and thickened skin around the nail. What increases the risk? This condition is more likely to develop in people who: Get their hands wet often, such as those who work as Fish farm manager, bartenders, or housekeepers. Bite their fingernails or cuticles. Have underlying skin conditions. Have hangnails or injured fingertips. Are exposed to irritants like detergents and other chemicals. Have diabetes. What are the signs or symptoms? Symptoms of this condition include: Redness and swelling of the skin near the nail. Tenderness around the nail when you touch the area. Pus-filled bumps under the cuticle. Fluid or pus under the nail. Throbbing pain in the area. How is this diagnosed? This condition is diagnosed with a physical exam. In some cases, a sample of pus may be tested to determine what type of bacteria or fungus is causing the condition. How is this treated? Treatment depends on the cause and severity of your condition. If your condition is mild, it may clear up on its own in a few days or after soaking in warm  water. If needed, treatment may include: Antibiotic medicine, if your infection is caused by bacteria. Antifungal medicine, if your infection is caused by a fungus. A procedure to drain pus from an abscess. Anti-inflammatory medicine (corticosteroids). Removal of part of an ingrown toenail. A bandage (dressing) may be placed over the affected area if an abscess or part of a nail has been removed. Follow these instructions at home: Wound care Keep the affected area clean. Soak the affected area in warm water if told to do so by your health care provider. You may be told to do this for 20 minutes, 2-3 times a day. Keep the area dry when you are not soaking it. Do not try to drain an abscess yourself. Follow instructions from your health care provider about how to take care of the affected area. Make sure you: Wash your hands with soap and water for at least 20 seconds before and after you change your dressing. If soap and water are not available, use hand sanitizer. Change your dressing as told by your health care provider. If you had an abscess drained, check the area every day for signs of infection. Check for: Redness, swelling, or pain. Fluid or blood. Warmth. Pus or a bad smell. Medicines  Take over-the-counter and prescription medicines only as told by your health care provider. If you were prescribed an antibiotic medicine, take it as told by your health care provider. Do not stop taking the antibiotic even if you start to feel better. General instructions Avoid contact with any skin irritants or allergens.  Do not pick at the affected area. Keep all follow-up visits as told. This is important. Prevention To prevent this condition from happening again: Wear rubber gloves when washing dishes or doing other tasks that require your hands to get wet. Wear gloves if your hands might come in contact with cleaners or other chemicals. Avoid injuring your nails or fingertips. Do not bite  your nails or tear hangnails. Do not cut your nails very short. Do not cut your cuticles. Use clean nail clippers or scissors when trimming nails. Contact a health care provider if: Your symptoms get worse or do not improve with treatment. You have continued or increased fluid, blood, or pus coming from the affected area. Your affected finger, toe, or joint becomes swollen or difficult to move. You have a fever or chills. There is redness spreading away from the affected area. Summary Paronychia is an infection of the skin that surrounds a nail. It often causes pain and swelling around the nail. In some cases, a collection of pus (abscess) can form near or under the nail. This condition may be caused by bacteria or a fungus. These germs can enter the body through an opening in the skin, such as a cut or a hangnail. If your condition is mild, it may clear up on its own in a few days. If needed, treatment may include medicine or a procedure to drain pus from an abscess. To prevent this condition from happening again, wear gloves if doing tasks that require your hands to get wet or to come in contact with chemicals. Also avoid injuring your nails or fingertips. This information is not intended to replace advice given to you by your health care provider. Make sure you discuss any questions you have with your health care provider. Document Revised: 07/17/2020 Document Reviewed: 07/17/2020 [employer] Patient Education  2024 [employer].

## 2025-01-17 ENCOUNTER — Encounter: Admitting: Family Medicine

## 2025-02-21 ENCOUNTER — Ambulatory Visit: Admitting: Internal Medicine
